# Patient Record
Sex: Female | Born: 1935 | Race: White | Hispanic: No | Marital: Married | Smoking: Former smoker
Health system: Southern US, Community
[De-identification: ages and names within clinical notes are randomized; demographics above are authoritative.]

## PROBLEM LIST (undated history)

## (undated) DIAGNOSIS — Q2730 Arteriovenous malformation, site unspecified: Secondary | ICD-10-CM

## (undated) DIAGNOSIS — K219 Gastro-esophageal reflux disease without esophagitis: Secondary | ICD-10-CM

## (undated) DIAGNOSIS — W19XXXA Unspecified fall, initial encounter: Secondary | ICD-10-CM

## (undated) DIAGNOSIS — Y92129 Unspecified place in nursing home as the place of occurrence of the external cause: Secondary | ICD-10-CM

## (undated) DIAGNOSIS — F419 Anxiety disorder, unspecified: Secondary | ICD-10-CM

## (undated) DIAGNOSIS — I2510 Atherosclerotic heart disease of native coronary artery without angina pectoris: Secondary | ICD-10-CM

## (undated) DIAGNOSIS — F0390 Unspecified dementia without behavioral disturbance: Secondary | ICD-10-CM

## (undated) DIAGNOSIS — K5790 Diverticulosis of intestine, part unspecified, without perforation or abscess without bleeding: Secondary | ICD-10-CM

## (undated) DIAGNOSIS — D649 Anemia, unspecified: Secondary | ICD-10-CM

## (undated) DIAGNOSIS — K922 Gastrointestinal hemorrhage, unspecified: Secondary | ICD-10-CM

## (undated) DIAGNOSIS — K222 Esophageal obstruction: Secondary | ICD-10-CM

## (undated) DIAGNOSIS — F32A Depression, unspecified: Secondary | ICD-10-CM

## (undated) DIAGNOSIS — K449 Diaphragmatic hernia without obstruction or gangrene: Secondary | ICD-10-CM

## (undated) DIAGNOSIS — L409 Psoriasis, unspecified: Secondary | ICD-10-CM

## (undated) DIAGNOSIS — R410 Disorientation, unspecified: Secondary | ICD-10-CM

## (undated) DIAGNOSIS — S72009A Fracture of unspecified part of neck of unspecified femur, initial encounter for closed fracture: Secondary | ICD-10-CM

## (undated) DIAGNOSIS — I10 Essential (primary) hypertension: Secondary | ICD-10-CM

## (undated) DIAGNOSIS — D509 Iron deficiency anemia, unspecified: Secondary | ICD-10-CM

## (undated) DIAGNOSIS — F329 Major depressive disorder, single episode, unspecified: Secondary | ICD-10-CM

## (undated) DIAGNOSIS — S329XXA Fracture of unspecified parts of lumbosacral spine and pelvis, initial encounter for closed fracture: Secondary | ICD-10-CM

## (undated) DIAGNOSIS — M1990 Unspecified osteoarthritis, unspecified site: Secondary | ICD-10-CM

## (undated) DIAGNOSIS — I999 Unspecified disorder of circulatory system: Secondary | ICD-10-CM

## (undated) DIAGNOSIS — IMO0002 Reserved for concepts with insufficient information to code with codable children: Secondary | ICD-10-CM

## (undated) DIAGNOSIS — K3184 Gastroparesis: Secondary | ICD-10-CM

## (undated) HISTORY — DX: Fracture of unspecified parts of lumbosacral spine and pelvis, initial encounter for closed fracture: S32.9XXA

## (undated) HISTORY — DX: Disorientation, unspecified: R41.0

## (undated) HISTORY — PX: TOTAL HIP ARTHROPLASTY: SHX124

## (undated) HISTORY — DX: Major depressive disorder, single episode, unspecified: F32.9

## (undated) HISTORY — DX: Unspecified place in nursing home as the place of occurrence of the external cause: Y92.129

## (undated) HISTORY — DX: Psoriasis, unspecified: L40.9

## (undated) HISTORY — DX: Gastrointestinal hemorrhage, unspecified: K92.2

## (undated) HISTORY — DX: Unspecified osteoarthritis, unspecified site: M19.90

## (undated) HISTORY — DX: Diverticulosis of intestine, part unspecified, without perforation or abscess without bleeding: K57.90

## (undated) HISTORY — DX: Unspecified fall, initial encounter: W19.XXXA

## (undated) HISTORY — DX: Depression, unspecified: F32.A

## (undated) HISTORY — DX: Esophageal obstruction: K22.2

## (undated) HISTORY — DX: Reserved for concepts with insufficient information to code with codable children: IMO0002

## (undated) HISTORY — PX: CHOLECYSTECTOMY: SHX55

## (undated) HISTORY — DX: Anxiety disorder, unspecified: F41.9

## (undated) HISTORY — DX: Iron deficiency anemia, unspecified: D50.9

## (undated) HISTORY — PX: HEMORRHOID SURGERY: SHX153

## (undated) HISTORY — PX: CATARACT EXTRACTION: SUR2

## (undated) HISTORY — DX: Diaphragmatic hernia without obstruction or gangrene: K44.9

## (undated) HISTORY — DX: Gastro-esophageal reflux disease without esophagitis: K21.9

## (undated) HISTORY — PX: INCISIONAL HERNIA REPAIR: SHX193

## (undated) HISTORY — DX: Unspecified disorder of circulatory system: I99.9

## (undated) HISTORY — DX: Gastroparesis: K31.84

## (undated) HISTORY — DX: Arteriovenous malformation, site unspecified: Q27.30

## (undated) HISTORY — DX: Unspecified dementia, unspecified severity, without behavioral disturbance, psychotic disturbance, mood disturbance, and anxiety: F03.90

---

## 1992-10-12 HISTORY — PX: OTHER SURGICAL HISTORY: SHX169

## 1995-07-04 DIAGNOSIS — K222 Esophageal obstruction: Secondary | ICD-10-CM

## 1995-07-04 DIAGNOSIS — K449 Diaphragmatic hernia without obstruction or gangrene: Secondary | ICD-10-CM

## 1995-07-04 HISTORY — DX: Esophageal obstruction: K22.2

## 1995-07-04 HISTORY — DX: Diaphragmatic hernia without obstruction or gangrene: K44.9

## 1998-04-20 ENCOUNTER — Encounter: Admission: RE | Admit: 1998-04-20 | Discharge: 1998-04-20 | Payer: Self-pay | Admitting: Family Medicine

## 1998-04-28 ENCOUNTER — Encounter: Admission: RE | Admit: 1998-04-28 | Discharge: 1998-04-28 | Payer: Self-pay | Admitting: Family Medicine

## 1998-05-20 ENCOUNTER — Encounter: Admission: RE | Admit: 1998-05-20 | Discharge: 1998-05-20 | Payer: Self-pay | Admitting: Family Medicine

## 1998-08-17 ENCOUNTER — Encounter: Admission: RE | Admit: 1998-08-17 | Discharge: 1998-08-17 | Payer: Self-pay | Admitting: Sports Medicine

## 1998-10-14 ENCOUNTER — Encounter: Admission: RE | Admit: 1998-10-14 | Discharge: 1998-10-14 | Payer: Self-pay | Admitting: Family Medicine

## 1998-12-14 ENCOUNTER — Encounter: Admission: RE | Admit: 1998-12-14 | Discharge: 1998-12-14 | Payer: Self-pay | Admitting: Family Medicine

## 1999-06-01 ENCOUNTER — Encounter: Admission: RE | Admit: 1999-06-01 | Discharge: 1999-06-01 | Payer: Self-pay | Admitting: Family Medicine

## 1999-06-24 ENCOUNTER — Encounter: Admission: RE | Admit: 1999-06-24 | Discharge: 1999-06-24 | Payer: Self-pay | Admitting: Sports Medicine

## 1999-07-07 ENCOUNTER — Encounter: Admission: RE | Admit: 1999-07-07 | Discharge: 1999-07-07 | Payer: Self-pay | Admitting: Sports Medicine

## 1999-07-07 ENCOUNTER — Encounter: Payer: Self-pay | Admitting: Sports Medicine

## 1999-12-08 ENCOUNTER — Encounter: Admission: RE | Admit: 1999-12-08 | Discharge: 1999-12-08 | Payer: Self-pay | Admitting: Family Medicine

## 2000-02-10 ENCOUNTER — Encounter: Admission: RE | Admit: 2000-02-10 | Discharge: 2000-02-10 | Payer: Self-pay | Admitting: Family Medicine

## 2000-08-14 ENCOUNTER — Encounter: Admission: RE | Admit: 2000-08-14 | Discharge: 2000-08-14 | Payer: Self-pay | Admitting: Family Medicine

## 2000-09-14 ENCOUNTER — Encounter: Admission: RE | Admit: 2000-09-14 | Discharge: 2000-09-14 | Payer: Self-pay | Admitting: Family Medicine

## 2000-09-18 ENCOUNTER — Encounter: Admission: RE | Admit: 2000-09-18 | Discharge: 2000-09-18 | Payer: Self-pay | Admitting: Family Medicine

## 2000-10-02 ENCOUNTER — Encounter (INDEPENDENT_AMBULATORY_CARE_PROVIDER_SITE_OTHER): Payer: Self-pay | Admitting: *Deleted

## 2000-10-02 LAB — CONVERTED CEMR LAB

## 2000-10-23 ENCOUNTER — Encounter: Admission: RE | Admit: 2000-10-23 | Discharge: 2000-10-23 | Payer: Self-pay | Admitting: Family Medicine

## 2000-10-23 ENCOUNTER — Encounter: Payer: Self-pay | Admitting: Family Medicine

## 2001-03-12 ENCOUNTER — Encounter: Admission: RE | Admit: 2001-03-12 | Discharge: 2001-03-12 | Payer: Self-pay | Admitting: Sports Medicine

## 2001-04-02 ENCOUNTER — Encounter: Admission: RE | Admit: 2001-04-02 | Discharge: 2001-04-02 | Payer: Self-pay | Admitting: Family Medicine

## 2001-09-05 ENCOUNTER — Encounter: Admission: RE | Admit: 2001-09-05 | Discharge: 2001-09-05 | Payer: Self-pay | Admitting: Family Medicine

## 2001-09-25 ENCOUNTER — Encounter: Admission: RE | Admit: 2001-09-25 | Discharge: 2001-09-25 | Payer: Self-pay | Admitting: Family Medicine

## 2001-10-01 ENCOUNTER — Encounter: Admission: RE | Admit: 2001-10-01 | Discharge: 2001-10-01 | Payer: Self-pay | Admitting: Family Medicine

## 2001-10-15 ENCOUNTER — Encounter: Payer: Self-pay | Admitting: Family Medicine

## 2001-10-15 ENCOUNTER — Encounter: Admission: RE | Admit: 2001-10-15 | Discharge: 2001-10-15 | Payer: Self-pay | Admitting: Family Medicine

## 2001-10-31 ENCOUNTER — Encounter: Admission: RE | Admit: 2001-10-31 | Discharge: 2001-10-31 | Payer: Self-pay | Admitting: Family Medicine

## 2001-11-07 ENCOUNTER — Encounter: Admission: RE | Admit: 2001-11-07 | Discharge: 2001-11-07 | Payer: Self-pay | Admitting: Family Medicine

## 2002-02-06 ENCOUNTER — Encounter: Admission: RE | Admit: 2002-02-06 | Discharge: 2002-02-06 | Payer: Self-pay | Admitting: Family Medicine

## 2002-04-24 ENCOUNTER — Inpatient Hospital Stay (HOSPITAL_COMMUNITY): Admission: EM | Admit: 2002-04-24 | Discharge: 2002-04-25 | Payer: Self-pay | Admitting: Emergency Medicine

## 2002-04-24 ENCOUNTER — Encounter: Payer: Self-pay | Admitting: Emergency Medicine

## 2002-05-01 ENCOUNTER — Encounter: Admission: RE | Admit: 2002-05-01 | Discharge: 2002-05-01 | Payer: Self-pay | Admitting: Family Medicine

## 2002-10-23 ENCOUNTER — Encounter: Admission: RE | Admit: 2002-10-23 | Discharge: 2002-10-23 | Payer: Self-pay | Admitting: Family Medicine

## 2003-03-31 ENCOUNTER — Encounter: Admission: RE | Admit: 2003-03-31 | Discharge: 2003-03-31 | Payer: Self-pay | Admitting: Sports Medicine

## 2003-04-01 ENCOUNTER — Encounter: Admission: RE | Admit: 2003-04-01 | Discharge: 2003-04-01 | Payer: Self-pay | Admitting: Family Medicine

## 2003-04-29 ENCOUNTER — Encounter: Admission: RE | Admit: 2003-04-29 | Discharge: 2003-04-29 | Payer: Self-pay | Admitting: Family Medicine

## 2003-06-22 ENCOUNTER — Ambulatory Visit (HOSPITAL_BASED_OUTPATIENT_CLINIC_OR_DEPARTMENT_OTHER): Admission: RE | Admit: 2003-06-22 | Discharge: 2003-06-22 | Payer: Self-pay | Admitting: Surgery

## 2003-06-22 ENCOUNTER — Ambulatory Visit (HOSPITAL_COMMUNITY): Admission: RE | Admit: 2003-06-22 | Discharge: 2003-06-22 | Payer: Self-pay | Admitting: Surgery

## 2003-06-30 ENCOUNTER — Encounter: Admission: RE | Admit: 2003-06-30 | Discharge: 2003-06-30 | Payer: Self-pay | Admitting: Family Medicine

## 2003-11-17 ENCOUNTER — Encounter: Admission: RE | Admit: 2003-11-17 | Discharge: 2003-11-17 | Payer: Self-pay | Admitting: Family Medicine

## 2003-12-01 ENCOUNTER — Encounter: Admission: RE | Admit: 2003-12-01 | Discharge: 2003-12-01 | Payer: Self-pay | Admitting: Family Medicine

## 2003-12-11 ENCOUNTER — Encounter: Admission: RE | Admit: 2003-12-11 | Discharge: 2003-12-11 | Payer: Self-pay | Admitting: Emergency Medicine

## 2004-04-05 ENCOUNTER — Ambulatory Visit: Payer: Self-pay | Admitting: Family Medicine

## 2004-04-08 ENCOUNTER — Ambulatory Visit (HOSPITAL_COMMUNITY): Admission: RE | Admit: 2004-04-08 | Discharge: 2004-04-08 | Payer: Self-pay | Admitting: Family Medicine

## 2004-04-08 ENCOUNTER — Ambulatory Visit: Payer: Self-pay | Admitting: Family Medicine

## 2004-04-26 ENCOUNTER — Ambulatory Visit: Payer: Self-pay | Admitting: Family Medicine

## 2004-05-11 ENCOUNTER — Ambulatory Visit: Payer: Self-pay | Admitting: Gastroenterology

## 2004-07-19 ENCOUNTER — Ambulatory Visit: Payer: Self-pay | Admitting: Family Medicine

## 2004-08-09 ENCOUNTER — Ambulatory Visit: Payer: Self-pay | Admitting: Family Medicine

## 2004-09-07 ENCOUNTER — Ambulatory Visit: Payer: Self-pay | Admitting: Gastroenterology

## 2004-09-14 ENCOUNTER — Ambulatory Visit (HOSPITAL_COMMUNITY): Admission: RE | Admit: 2004-09-14 | Discharge: 2004-09-14 | Payer: Self-pay | Admitting: Gastroenterology

## 2004-09-29 ENCOUNTER — Ambulatory Visit (HOSPITAL_COMMUNITY): Admission: RE | Admit: 2004-09-29 | Discharge: 2004-09-29 | Payer: Self-pay | Admitting: Gastroenterology

## 2004-09-29 ENCOUNTER — Ambulatory Visit: Payer: Self-pay | Admitting: Gastroenterology

## 2004-12-20 ENCOUNTER — Ambulatory Visit: Payer: Self-pay | Admitting: Family Medicine

## 2005-04-19 ENCOUNTER — Emergency Department (HOSPITAL_COMMUNITY): Admission: EM | Admit: 2005-04-19 | Discharge: 2005-04-19 | Payer: Self-pay | Admitting: Emergency Medicine

## 2005-04-25 ENCOUNTER — Ambulatory Visit: Payer: Self-pay | Admitting: Family Medicine

## 2005-07-27 ENCOUNTER — Ambulatory Visit: Payer: Self-pay | Admitting: Family Medicine

## 2005-10-17 ENCOUNTER — Emergency Department (HOSPITAL_COMMUNITY): Admission: EM | Admit: 2005-10-17 | Discharge: 2005-10-17 | Payer: Self-pay | Admitting: Family Medicine

## 2005-10-24 ENCOUNTER — Ambulatory Visit: Payer: Self-pay | Admitting: Family Medicine

## 2005-11-07 ENCOUNTER — Ambulatory Visit: Payer: Self-pay | Admitting: Family Medicine

## 2006-03-19 ENCOUNTER — Emergency Department (HOSPITAL_COMMUNITY): Admission: EM | Admit: 2006-03-19 | Discharge: 2006-03-19 | Payer: Self-pay | Admitting: Emergency Medicine

## 2006-03-29 ENCOUNTER — Ambulatory Visit: Payer: Self-pay | Admitting: Family Medicine

## 2006-05-14 ENCOUNTER — Ambulatory Visit: Payer: Self-pay | Admitting: Sports Medicine

## 2006-08-30 DIAGNOSIS — H3530 Unspecified macular degeneration: Secondary | ICD-10-CM | POA: Insufficient documentation

## 2006-08-30 DIAGNOSIS — E119 Type 2 diabetes mellitus without complications: Secondary | ICD-10-CM

## 2006-08-30 DIAGNOSIS — I10 Essential (primary) hypertension: Secondary | ICD-10-CM

## 2006-08-30 DIAGNOSIS — E785 Hyperlipidemia, unspecified: Secondary | ICD-10-CM

## 2006-08-31 ENCOUNTER — Encounter (INDEPENDENT_AMBULATORY_CARE_PROVIDER_SITE_OTHER): Payer: Self-pay | Admitting: *Deleted

## 2006-09-18 ENCOUNTER — Ambulatory Visit: Payer: Self-pay | Admitting: Family Medicine

## 2006-09-18 LAB — CONVERTED CEMR LAB
AST: 25 units/L (ref 0–37)
Albumin: 3.8 g/dL (ref 3.5–5.2)
Alkaline Phosphatase: 77 units/L (ref 39–117)
HDL: 38 mg/dL — ABNORMAL LOW (ref >39)
LDL Cholesterol: 127 mg/dL — ABNORMAL HIGH (ref 0–99)
Potassium: 3.6 meq/L (ref 3.5–5.3)
Sodium: 140 meq/L (ref 135–145)
Total Bilirubin: 0.3 mg/dL (ref 0.3–1.2)
Total Protein: 7.1 g/dL (ref 6.0–8.3)
VLDL: 54 mg/dL — ABNORMAL HIGH (ref 0–40)

## 2006-10-09 ENCOUNTER — Ambulatory Visit: Payer: Self-pay | Admitting: Family Medicine

## 2006-10-09 DIAGNOSIS — N3946 Mixed incontinence: Secondary | ICD-10-CM

## 2006-11-19 ENCOUNTER — Emergency Department (HOSPITAL_COMMUNITY): Admission: EM | Admit: 2006-11-19 | Discharge: 2006-11-19 | Payer: Self-pay | Admitting: Emergency Medicine

## 2006-11-22 ENCOUNTER — Ambulatory Visit: Payer: Self-pay | Admitting: Family Medicine

## 2006-11-22 DIAGNOSIS — L408 Other psoriasis: Secondary | ICD-10-CM

## 2006-11-22 LAB — CONVERTED CEMR LAB
MCV: 71.5 fL
RBC: 5.23 M/uL
WBC: 8.9 10*3/uL

## 2006-11-23 ENCOUNTER — Encounter: Payer: Self-pay | Admitting: Family Medicine

## 2006-11-23 LAB — CONVERTED CEMR LAB
BUN: 12 mg/dL (ref 6–23)
CO2: 28 meq/L (ref 19–32)
Glucose, Bld: 181 mg/dL — ABNORMAL HIGH (ref 70–99)
Potassium: 4 meq/L (ref 3.5–5.3)
Pro B Natriuretic peptide (BNP): 78 pg/mL (ref 0.0–100.0)

## 2006-11-27 ENCOUNTER — Encounter: Payer: Self-pay | Admitting: Family Medicine

## 2006-12-03 ENCOUNTER — Encounter: Admission: RE | Admit: 2006-12-03 | Discharge: 2006-12-03 | Payer: Self-pay | Admitting: Family Medicine

## 2006-12-04 ENCOUNTER — Ambulatory Visit: Payer: Self-pay | Admitting: Family Medicine

## 2007-01-31 ENCOUNTER — Encounter: Payer: Self-pay | Admitting: Family Medicine

## 2007-02-28 ENCOUNTER — Encounter: Payer: Self-pay | Admitting: Family Medicine

## 2007-03-21 ENCOUNTER — Encounter: Payer: Self-pay | Admitting: Family Medicine

## 2007-05-03 ENCOUNTER — Encounter: Payer: Self-pay | Admitting: Emergency Medicine

## 2007-05-03 ENCOUNTER — Encounter: Payer: Self-pay | Admitting: Family Medicine

## 2007-05-04 ENCOUNTER — Ambulatory Visit: Payer: Self-pay | Admitting: Cardiology

## 2007-05-04 ENCOUNTER — Ambulatory Visit: Payer: Self-pay | Admitting: Internal Medicine

## 2007-05-04 ENCOUNTER — Inpatient Hospital Stay (HOSPITAL_COMMUNITY): Admission: EM | Admit: 2007-05-04 | Discharge: 2007-05-09 | Payer: Self-pay | Admitting: Family Medicine

## 2007-05-06 ENCOUNTER — Encounter: Payer: Self-pay | Admitting: Family Medicine

## 2007-05-13 ENCOUNTER — Encounter: Admission: RE | Admit: 2007-05-13 | Discharge: 2007-05-13 | Payer: Self-pay | Admitting: Family Medicine

## 2007-05-13 ENCOUNTER — Ambulatory Visit: Payer: Self-pay | Admitting: Family Medicine

## 2007-05-13 DIAGNOSIS — R262 Difficulty in walking, not elsewhere classified: Secondary | ICD-10-CM | POA: Insufficient documentation

## 2007-05-13 LAB — CONVERTED CEMR LAB
BUN: 10 mg/dL (ref 6–23)
CO2: 27 meq/L (ref 19–32)
Calcium: 9 mg/dL (ref 8.4–10.5)
Chloride: 104 meq/L (ref 96–112)
Creatinine, Ser: 0.61 mg/dL (ref 0.40–1.20)

## 2007-05-23 ENCOUNTER — Encounter: Payer: Self-pay | Admitting: Family Medicine

## 2007-10-22 ENCOUNTER — Telehealth: Payer: Self-pay | Admitting: *Deleted

## 2007-10-22 ENCOUNTER — Ambulatory Visit: Payer: Self-pay | Admitting: Family Medicine

## 2007-10-22 LAB — CONVERTED CEMR LAB
ALT: 9 units/L (ref 0–35)
AST: 11 units/L (ref 0–37)
Albumin: 3.5 g/dL (ref 3.5–5.2)
Alkaline Phosphatase: 90 units/L (ref 39–117)
Basophils Absolute: 0 10*3/uL (ref 0.0–0.1)
Basophils Relative: 0 % (ref 0–1)
Eosinophils Absolute: 0 10*3/uL (ref 0.0–0.7)
Eosinophils Relative: 0 % (ref 0–5)
Glucose, Bld: 238 mg/dL — ABNORMAL HIGH (ref 70–99)
HCT: 42.3 % (ref 36.0–46.0)
Lymphs Abs: 1.4 10*3/uL (ref 0.7–4.0)
MCHC: 29.8 g/dL — ABNORMAL LOW (ref 30.0–36.0)
MCV: 74.6 fL — ABNORMAL LOW (ref 78.0–100.0)
Neutrophils Relative %: 74 % (ref 43–77)
Platelets: 323 10*3/uL (ref 150–400)
Potassium: 3.8 meq/L (ref 3.5–5.3)
RDW: 19.8 % — ABNORMAL HIGH (ref 11.5–15.5)
Sodium: 141 meq/L (ref 135–145)
Total Bilirubin: 0.3 mg/dL (ref 0.3–1.2)
Total Protein: 6.7 g/dL (ref 6.0–8.3)
WBC: 7.7 10*3/uL (ref 4.0–10.5)

## 2007-10-28 ENCOUNTER — Ambulatory Visit: Payer: Self-pay | Admitting: Family Medicine

## 2007-10-28 LAB — CONVERTED CEMR LAB
LDL Cholesterol: 133 mg/dL — ABNORMAL HIGH (ref 0–99)
Triglycerides: 179 mg/dL — ABNORMAL HIGH (ref <150)
VLDL: 36 mg/dL (ref 0–40)

## 2007-11-07 LAB — CONVERTED CEMR LAB

## 2007-11-26 ENCOUNTER — Telehealth: Payer: Self-pay | Admitting: *Deleted

## 2007-11-28 ENCOUNTER — Ambulatory Visit: Payer: Self-pay | Admitting: Family Medicine

## 2008-01-23 ENCOUNTER — Ambulatory Visit: Payer: Self-pay | Admitting: Sports Medicine

## 2008-01-23 ENCOUNTER — Telehealth: Payer: Self-pay | Admitting: *Deleted

## 2008-02-06 ENCOUNTER — Encounter: Payer: Self-pay | Admitting: *Deleted

## 2008-03-26 ENCOUNTER — Telehealth: Payer: Self-pay | Admitting: *Deleted

## 2008-03-27 ENCOUNTER — Ambulatory Visit: Payer: Self-pay | Admitting: Family Medicine

## 2008-03-27 LAB — CONVERTED CEMR LAB: Hgb A1c MFr Bld: 8.6 %

## 2008-04-02 ENCOUNTER — Telehealth: Payer: Self-pay | Admitting: *Deleted

## 2008-04-09 ENCOUNTER — Ambulatory Visit: Payer: Self-pay | Admitting: Family Medicine

## 2008-12-05 ENCOUNTER — Ambulatory Visit: Payer: Self-pay | Admitting: Family Medicine

## 2008-12-05 ENCOUNTER — Encounter: Payer: Self-pay | Admitting: Emergency Medicine

## 2008-12-05 ENCOUNTER — Inpatient Hospital Stay (HOSPITAL_COMMUNITY): Admission: EM | Admit: 2008-12-05 | Discharge: 2008-12-07 | Payer: Self-pay | Admitting: Family Medicine

## 2008-12-06 ENCOUNTER — Encounter: Payer: Self-pay | Admitting: Family Medicine

## 2008-12-06 DIAGNOSIS — F29 Unspecified psychosis not due to a substance or known physiological condition: Secondary | ICD-10-CM | POA: Insufficient documentation

## 2008-12-07 ENCOUNTER — Ambulatory Visit: Payer: Self-pay | Admitting: Vascular Surgery

## 2008-12-07 ENCOUNTER — Encounter: Payer: Self-pay | Admitting: Family Medicine

## 2008-12-08 ENCOUNTER — Telehealth: Payer: Self-pay | Admitting: *Deleted

## 2008-12-08 ENCOUNTER — Telehealth: Payer: Self-pay | Admitting: Family Medicine

## 2008-12-08 LAB — CONVERTED CEMR LAB: Hgb A1c MFr Bld: 7.1 %

## 2008-12-10 DIAGNOSIS — R410 Disorientation, unspecified: Secondary | ICD-10-CM

## 2008-12-10 HISTORY — DX: Disorientation, unspecified: R41.0

## 2008-12-14 ENCOUNTER — Ambulatory Visit: Payer: Self-pay | Admitting: Family Medicine

## 2008-12-14 LAB — CONVERTED CEMR LAB
Ketones, urine, test strip: NEGATIVE
Nitrite: NEGATIVE
Specific Gravity, Urine: 1.025
Urobilinogen, UA: 0.2
pH: 6.5

## 2008-12-29 ENCOUNTER — Telehealth: Payer: Self-pay | Admitting: *Deleted

## 2009-01-07 ENCOUNTER — Emergency Department (HOSPITAL_COMMUNITY): Admission: EM | Admit: 2009-01-07 | Discharge: 2009-01-07 | Payer: Self-pay | Admitting: Emergency Medicine

## 2009-01-08 ENCOUNTER — Telehealth: Payer: Self-pay | Admitting: Family Medicine

## 2009-01-11 ENCOUNTER — Ambulatory Visit: Payer: Self-pay | Admitting: Family Medicine

## 2009-06-02 ENCOUNTER — Emergency Department (HOSPITAL_COMMUNITY): Admission: EM | Admit: 2009-06-02 | Discharge: 2009-06-02 | Payer: Self-pay | Admitting: Emergency Medicine

## 2009-06-02 ENCOUNTER — Telehealth: Payer: Self-pay | Admitting: Family Medicine

## 2009-06-04 ENCOUNTER — Emergency Department (HOSPITAL_COMMUNITY): Admission: EM | Admit: 2009-06-04 | Discharge: 2009-06-05 | Payer: Self-pay | Admitting: Emergency Medicine

## 2009-06-04 ENCOUNTER — Telehealth: Payer: Self-pay | Admitting: Family Medicine

## 2009-06-08 ENCOUNTER — Telehealth: Payer: Self-pay | Admitting: Family Medicine

## 2009-06-11 ENCOUNTER — Ambulatory Visit: Payer: Self-pay | Admitting: Family Medicine

## 2009-07-01 ENCOUNTER — Inpatient Hospital Stay (HOSPITAL_COMMUNITY): Admission: EM | Admit: 2009-07-01 | Discharge: 2009-07-15 | Payer: Self-pay | Admitting: Emergency Medicine

## 2009-07-01 ENCOUNTER — Encounter: Payer: Self-pay | Admitting: Family Medicine

## 2009-07-01 ENCOUNTER — Ambulatory Visit: Payer: Self-pay | Admitting: Family Medicine

## 2009-07-01 DIAGNOSIS — S329XXA Fracture of unspecified parts of lumbosacral spine and pelvis, initial encounter for closed fracture: Secondary | ICD-10-CM

## 2009-07-01 DIAGNOSIS — E041 Nontoxic single thyroid nodule: Secondary | ICD-10-CM | POA: Insufficient documentation

## 2009-07-01 HISTORY — DX: Fracture of unspecified parts of lumbosacral spine and pelvis, initial encounter for closed fracture: S32.9XXA

## 2009-07-04 ENCOUNTER — Encounter: Payer: Self-pay | Admitting: Family Medicine

## 2009-07-09 ENCOUNTER — Telehealth: Payer: Self-pay | Admitting: Family Medicine

## 2009-08-19 ENCOUNTER — Telehealth: Payer: Self-pay | Admitting: *Deleted

## 2009-09-09 ENCOUNTER — Ambulatory Visit: Payer: Self-pay | Admitting: Family Medicine

## 2009-10-29 ENCOUNTER — Telehealth: Payer: Self-pay | Admitting: Family Medicine

## 2010-07-24 ENCOUNTER — Encounter: Payer: Self-pay | Admitting: Gastroenterology

## 2010-08-02 NOTE — Progress Notes (Signed)
Summary: phn msg  Phone Note From Other Clinic Call back at [phone]   Caller: [name] Summary of Call: has been released from hospital and is wanting to know if she needs to follow up w/ orthopedics for hip fracture Initial call taken by: De Nurse,  August 19, 2009 2:22 PM  Follow-up for Phone Call        would need to call her orthopedist to ask that. She should see me in the office in the next 2-3 weeks thanks  Follow-up by: Pearlean Brownie MD,  August 19, 2009 3:08 PM  Additional Follow-up for Phone Call Additional follow up Details #1::        [name] informed appt scheduled with PCP for 09/09/09.  Additional Follow-up by: Garen Grams LPN,  August 19, 2009 4:45 PM

## 2010-08-02 NOTE — Progress Notes (Signed)
Summary: phn msg  Phone Note Call from Patient Call back at [phone]   Caller: sister-Mary Truglio Summary of Call: calling to talk to Dr. Deirdre Priest concerning her sister's condition. Initial call taken by: Clydell Hakim,  July 09, 2009 11:58 AM  Follow-up for Phone Call        Spoke with her sister She wiill be visiting in a few weeks Follow-up by: Pearlean Brownie MD,  July 09, 2009 1:42 PM

## 2010-08-02 NOTE — Assessment & Plan Note (Signed)
Summary: Mackenzie [name]   Vital Signs:  Patient profile:   77 year old female Height:      65 inches Temp:     98.4 degrees F oral Pulse rate:   81 / minute BP sitting:   118 / 67  (left arm) Cuff size:   regular  Vitals Entered By: Garen Grams LPN (September 09, 2009 10:31 AM) CC: Mackenzie [name] Is Patient Diabetic? Yes Did you bring your meter with you today? No Pain Assessment Patient in pain? no        Primary Care Provider:  Pearlean Brownie MD  CC:  Mackenzie [name].  History of Present Illness: Here from [facility] Feels well  Current Problems:   PELVIC FRACTURE (ICD-808.8) saw orthopedist last week her released her to [facility].  Has not stood on her own.  Will be getting PT at nursing home.  Minimal pain with sitting  MENTAL CONFUSION (ICD-298.9) stable according to her husband Leonette Most.  No recent loss of consciosness or focal weakenss  HYPERTENSION, BENIGN SYSTEMIC (ICD-401.1) brought in her medication list.  No chest pain or shortness of breath   HYPERLIPIDEMIA (ICD-272.4) taking zocor daily.  No right upper quadrant pain or chest pain  DIABETES MELLITUS II, UNCOMPLICATED (ICD-250.00) taking metformin.  Husband does not think getting insulin at nursing home.  No hypoglycemic spells that either knows of  ROS - as above PMH - Medications reviewed and updated in medication list.  Smoking Status noted in VS form     Habits & Providers  Alcohol-Tobacco-Diet     Tobacco Status: never   Current Medications (verified): 1)  Bayer Childrens Aspirin 81 Mg Chew (Aspirin) .... Take 1 Tablet By Mouth Once A Day 2)  Microzide 12.5 Mg Caps (Hydrochlorothiazide) .Marland Kitchen.. 1 Daily 3)  Triamcinolone Acetonide 0.5 % Oint (Triamcinolone Acetonide) .... Apply To Psoriasis Two Times A Day. Largest Tube Available - 60 Gram 4)  Metformin Hcl 1000 Mg Tabs (Metformin Hcl) .... Take 1 Tablet By Mouth Two Times A Day For Diabetes 5)  Simvastatin 40 Mg Tabs (Simvastatin) .... Take One Tablet At Bedtime 6)   Calcium Carbonate-Vitamin D 600-400 Mg-Unit  Tabs (Calcium Carbonate-Vitamin D) .Marland Kitchen.. 1 By Mouth Two Times A Day 7)  Lopressor 50 Mg Tabs (Metoprolol Tartrate) .Marland Kitchen.. 1 By Mouth Two Times A Day 8)  Remeron 15 Mg Tabs (Mirtazapine) .... 1/2 Tab At Bedtime 9)  Miacalcin 200 Unit/ml Soln (Calcitonin (Salmon)) .Marland Kitchen.. 1 Spray Daily  Allergies: No Known Drug Allergies  Physical Exam  General:  awake alert converstational Lungs:  Normal respiratory effort, chest expands symmetrically. Lungs are clear to auscultation, no crackles or wheezes. Heart:  Normal rate and regular rhythm. S1 and S2 normal without gallop, murmur, click, rub or other extra sounds. Msk:  Sitting in wheel chair.  Not able to rise on own Minimal pain with ROM of either hip. Distal strength in ankles intact.  Hip extenders are weak bilaterally  Skin:  no skin breakdown on feet or legs    Impression & Recommendations:  Problem # 1:  PELVIC FRACTURE (ICD-808.8) Assessment Improved  Now major need is for PT  Orders: [facility]- Est  Level 4 (16109)  Problem # 2:  DIABETES MELLITUS II, UNCOMPLICATED (ICD-250.00) Assessment: Improved Good control  The following medications were removed from the medication list:    Enalapril Maleate 10 Mg Tabs (Enalapril maleate) .Marland Kitchen... 1 daily for blood pressure Her updated medication list for this problem includes:    Bayer Childrens Aspirin 81  Mg Chew (Aspirin) .Marland Kitchen... Take 1 tablet by mouth once a day    Metformin Hcl 1000 Mg Tabs (Metformin hcl) .Marland Kitchen... Take 1 tablet by mouth two times a day for diabetes  Orders: A1C-[facility] (45409) [facility]- Est  Level 4 (81191)  Labs Reviewed: Creat: 0.46 (10/22/2007)    Reviewed HgBA1c results: 6.6 (09/09/2009)  7.1 (12/08/2008)  Problem # 3:  HYPERTENSION, BENIGN SYSTEMIC (ICD-401.1)  Good control today The following medications were removed from the medication list:    Enalapril Maleate 10 Mg Tabs (Enalapril maleate) .Marland Kitchen... 1 daily for blood pressure Her  updated medication list for this problem includes:    Microzide 12.5 Mg Caps (Hydrochlorothiazide) .Marland Kitchen... 1 daily    Lopressor 50 Mg Tabs (Metoprolol tartrate) .Marland Kitchen... 1 by mouth two times a day  BP today: 118/67 Prior BP: 226/89 (07/01/2009)  Labs Reviewed: K+: 3.8 (10/22/2007) Creat: : 0.46 (10/22/2007)   Chol: 206 (10/28/2007)   HDL: 37 (10/28/2007)   LDL: 133 (10/28/2007)   TG: 179 (10/28/2007)  Orders: [facility]- Est  Level 4 (99214)  Problem # 4:  THYROID NODULE (ICD-241.0) Will need to follow up on next visit  Problem # 5:  MENTAL CONFUSION (ICD-298.9) consistent with alzhiemers.  If she is able to get home again will discuss starting therapy   Orders: [facility]- Est  Level 4 (47829)  Problem # 6:  WALKING DIFFICULTY (ICD-719.7) Hopefully PT will improve her strength and balance  Complete Medication List: 1)  Bayer Childrens Aspirin 81 Mg Chew (Aspirin) .... Take 1 tablet by mouth once a day 2)  Microzide 12.5 Mg Caps (Hydrochlorothiazide) .Marland Kitchen.. 1 daily 3)  Triamcinolone Acetonide 0.5 % Oint (Triamcinolone acetonide) .... Apply to psoriasis two times a day. largest tube available - 60 gram 4)  Metformin Hcl 1000 Mg Tabs (Metformin hcl) .... Take 1 tablet by mouth two times a day for diabetes 5)  Simvastatin 40 Mg Tabs (Simvastatin) .... Take one tablet at bedtime 6)  Calcium Carbonate-vitamin D 600-400 Mg-unit Tabs (Calcium carbonate-vitamin d) .Marland Kitchen.. 1 by mouth two times a day 7)  Lopressor 50 Mg Tabs (Metoprolol tartrate) .Marland Kitchen.. 1 by mouth two times a day 8)  Remeron 15 Mg Tabs (Mirtazapine) .... 1/2 tab at bedtime 9)  Miacalcin 200 Unit/ml Soln (Calcitonin (salmon)) .Marland Kitchen.. 1 spray daily  Patient Instructions: 1)  Please schedule a follow-up appointment in 6 months .  2)  Your diabetes is doing very well as your blood pressure 3)  Your main task is to exercise.   Work very hard with PT 4)  and do leg lifts when sitting at least three times a day 5)  If you are discharged from the nursing  home come see me in the next week or so  Laboratory Results   Blood Tests   Date/Time Received: September 09, 2009 10:25 AM  Date/Time Reported: September 09, 2009 10:50 AM   HGBA1C: 6.6%   (Normal Range: Non-Diabetic - 3-6%   Control Diabetic - 6-8%)  Comments: ...........test performed by..........Marland KitchenGaren Grams, LPN entered by Terese Door, CMA

## 2010-08-02 NOTE — Progress Notes (Signed)
Summary: phn msg  Phone Note Call from Patient Call back at [phone]   Caller: Mackenzie [name]- Sister Summary of Call: wants to know if she can come home from nursing home - at one time they told her that she has alztheimers and she states she(pt) doesn't.  She wants to go home. Initial call taken by: De Nurse,  October 29, 2009 12:10 PM

## 2010-09-15 ENCOUNTER — Encounter: Payer: Self-pay | Admitting: Family Medicine

## 2010-09-15 ENCOUNTER — Ambulatory Visit (INDEPENDENT_AMBULATORY_CARE_PROVIDER_SITE_OTHER): Payer: Medicare Other | Admitting: Family Medicine

## 2010-09-15 VITALS — BP 122/60 | HR 88 | Temp 98.4°F | Ht 65.0 in

## 2010-09-15 DIAGNOSIS — E785 Hyperlipidemia, unspecified: Secondary | ICD-10-CM

## 2010-09-15 DIAGNOSIS — E119 Type 2 diabetes mellitus without complications: Secondary | ICD-10-CM

## 2010-09-15 DIAGNOSIS — F329 Major depressive disorder, single episode, unspecified: Secondary | ICD-10-CM

## 2010-09-15 DIAGNOSIS — I10 Essential (primary) hypertension: Secondary | ICD-10-CM

## 2010-09-15 DIAGNOSIS — R262 Difficulty in walking, not elsewhere classified: Secondary | ICD-10-CM

## 2010-09-15 DIAGNOSIS — R5383 Other fatigue: Secondary | ICD-10-CM

## 2010-09-15 DIAGNOSIS — R531 Weakness: Secondary | ICD-10-CM | POA: Insufficient documentation

## 2010-09-15 DIAGNOSIS — M810 Age-related osteoporosis without current pathological fracture: Secondary | ICD-10-CM | POA: Insufficient documentation

## 2010-09-15 DIAGNOSIS — N3946 Mixed incontinence: Secondary | ICD-10-CM

## 2010-09-15 DIAGNOSIS — F0390 Unspecified dementia without behavioral disturbance: Secondary | ICD-10-CM

## 2010-09-15 LAB — CONVERTED CEMR LAB
ALT: 52 units/L — ABNORMAL HIGH (ref 0–35)
AST: 31 units/L (ref 0–37)
Albumin: 3.9 g/dL (ref 3.5–5.2)
Calcium: 9.9 mg/dL (ref 8.4–10.5)
Chloride: 101 meq/L (ref 96–112)
Creatinine, Ser: 0.74 mg/dL (ref 0.40–1.20)
Platelets: 270 10*3/uL (ref 150–400)
Potassium: 4.2 meq/L (ref 3.5–5.3)
RDW: 16.9 % — ABNORMAL HIGH (ref 11.5–15.5)

## 2010-09-15 LAB — CBC
HCT: 42.3 % (ref 36.0–46.0)
Hemoglobin: 12.8 g/dL (ref 12.0–15.0)
MCHC: 30.3 g/dL (ref 30.0–36.0)
MCV: 83.3 fL (ref 78.0–100.0)

## 2010-09-15 LAB — COMPREHENSIVE METABOLIC PANEL
ALT: 52 U/L — ABNORMAL HIGH (ref 0–35)
AST: 31 U/L (ref 0–37)
Alkaline Phosphatase: 186 U/L — ABNORMAL HIGH (ref 39–117)
CO2: 29 mEq/L (ref 19–32)
Creat: 0.74 mg/dL (ref 0.40–1.20)
Sodium: 142 mEq/L (ref 135–145)
Total Bilirubin: 0.2 mg/dL — ABNORMAL LOW (ref 0.3–1.2)
Total Protein: 6.8 g/dL (ref 6.0–8.3)

## 2010-09-15 LAB — TSH: TSH: 0.68 u[IU]/mL (ref 0.350–4.500)

## 2010-09-15 LAB — POCT GLYCOSYLATED HEMOGLOBIN (HGB A1C): Hemoglobin A1C: 6.6

## 2010-09-15 MED ORDER — SERTRALINE HCL 50 MG PO TABS: 50.0000 mg | ORAL_TABLET | Freq: Every day | ORAL | Status: DC

## 2010-09-15 MED ORDER — OYSTER SHELL CALCIUM 500 MG PO TABS: 500.0000 mg | ORAL_TABLET | Freq: Three times a day (TID) | ORAL | Status: DC

## 2010-09-15 MED ORDER — SIMVASTATIN 40 MG PO TABS: 40.0000 mg | ORAL_TABLET | Freq: Every day | ORAL | Status: DC

## 2010-09-15 MED ORDER — VITAMIN D 1000 UNITS PO CAPS: 2000.0000 [IU] | ORAL_CAPSULE | Freq: Every day | ORAL | Status: DC

## 2010-09-15 MED ORDER — METOPROLOL TARTRATE 50 MG PO TABS: 50.0000 mg | ORAL_TABLET | Freq: Two times a day (BID) | ORAL | Status: DC

## 2010-09-15 MED ORDER — ACETAMINOPHEN ER 650 MG PO TBCR: 650.0000 mg | EXTENDED_RELEASE_TABLET | Freq: Three times a day (TID) | ORAL | Status: AC | PRN

## 2010-09-15 MED ORDER — DONEPEZIL HCL 5 MG PO TABS: 5.0000 mg | ORAL_TABLET | Freq: Every day | ORAL | Status: DC

## 2010-09-15 NOTE — Assessment & Plan Note (Signed)
Needs refill on Zoloft. Did well on geri depression scale, not depressed/effective tx.

## 2010-09-15 NOTE — Progress Notes (Signed)
  Subjective:    Patient ID: Mackenzie [name], female    DOB: Jan 15, 1928, 77 y.o.   MRN: [id number]  HPI Pt is here for geri assessment and post SNF f/u.  Geri function screen: Unable to recall all, trouble with stairs, unable to do own shopping, has sx of urge incontinence, unsteady gait on ambulation. Geri depression screen: 2 positive answers. MMSE: 22/30 Has bath chair, raised commode seat, rolling walker, and cane at home.   HHPT will be coming out to house.  I spent >45 mins with this pt, >50% was face to face counselling. Review of Systems    See HPI Objective:   Physical Exam  Constitutional: She appears well-developed and well-nourished.  Neck: No thyromegaly present.  Cardiovascular: Normal rate, regular rhythm and normal heart sounds.  Exam reveals no gallop and no friction rub.   No murmur heard. Pulmonary/Chest: Effort normal and breath sounds normal. No respiratory distress. She has no wheezes. She has no rales. She exhibits no tenderness.  Lymphadenopathy:    She has no cervical adenopathy.  Skin: Skin is warm and dry.       Psoriasis plaque anterior scalp          Assessment & Plan:

## 2010-09-15 NOTE — Assessment & Plan Note (Addendum)
22/30 on MMSE. Starting aricept 5. May incr dose after 30d trial if needed.

## 2010-09-15 NOTE — Progress Notes (Signed)
  Subjective:    Patient ID: Mackenzie [name], female    DOB: Apr 18, 1928, 77 y.o.   MRN: [id number]  HPI    Review of Systems     Objective:   Physical Exam    Balance max value patientvalue  Sitting balance 1   Arise 2 Used arms  Attempts to arise 2   Immediate standing balance 2 Mildly unstable  Standing balance 1   Nudge 2   Eyes closed 1   360 degree turn 1   Sitting down 2    Gait max value patient value  Initiation of gait 1   Step length-left 1   Step length-right 1   Step height-left 1   Step height-right 1   Step symmetry 1   Step continuity 1   Path 2   Trunk 2   Walking stance  0      Mackenzie [name] used her arms to stand, was unable to lean back, discontinuous turn, externally rotated feet, forward leaning trunk, wide stance, low step height, and mildly unstable without using her walker.    Assessment & Plan:

## 2010-09-15 NOTE — Assessment & Plan Note (Signed)
Stopping intranasal calcitonin. Consider bisphosphonate.

## 2010-09-15 NOTE — Assessment & Plan Note (Signed)
Hopefully will improve symptoms with DC lasix. Can consider newer bladder antispasmodics such as Vesicare with less central action. At a later date.

## 2010-09-15 NOTE — Assessment & Plan Note (Addendum)
Controlled, no changes. Refilling Toprol. Stopping lasix. Checking lytes. Should assess for weight gain/edema at next visit. Hopefully this will also help her incontinence.

## 2010-09-15 NOTE — Assessment & Plan Note (Addendum)
A1c well controlled.  No changes in this patient.

## 2010-09-15 NOTE — Patient Instructions (Addendum)
Great to see you today. See below for medication changes. Checking some bloodwork. Your A1c level was excellent. Come back to see Dr. Deirdre Priest within a month to go over other problems.  -Dr. Karie Schwalbe. And Dr. Sheffield Slider.

## 2010-09-15 NOTE — Assessment & Plan Note (Signed)
Refilled statin, will need FLP.

## 2010-09-15 NOTE — Assessment & Plan Note (Addendum)
She did have significant pain with extension but no overt symptoms of neurogenic claudication. Should use rolling walker. HHPT will be coming out to house. Acetaminophen 650 TID.

## 2010-09-17 LAB — VITAMIN D 1,25 DIHYDROXY
Vitamin D 1, 25 (OH)2 Total: 43 pg/mL (ref 18–72)
Vitamin D2 1, 25 (OH)2: <8 pg/mL
Vitamin D3 1, 25 (OH)2: 43 pg/mL

## 2010-09-17 MED ORDER — METFORMIN HCL 1000 MG PO TABS: 1000.0000 mg | ORAL_TABLET | Freq: Two times a day (BID) | ORAL | Status: DC

## 2010-09-18 LAB — BASIC METABOLIC PANEL
BUN: 14 mg/dL (ref 6–23)
BUN: 34 mg/dL — ABNORMAL HIGH (ref 6–23)
BUN: 56 mg/dL — ABNORMAL HIGH (ref 6–23)
BUN: 6 mg/dL (ref 6–23)
BUN: 6 mg/dL (ref 6–23)
BUN: 77 mg/dL — ABNORMAL HIGH (ref 6–23)
BUN: 89 mg/dL — ABNORMAL HIGH (ref 6–23)
CO2: 24 mEq/L (ref 19–32)
CO2: 26 mEq/L (ref 19–32)
CO2: 26 mEq/L (ref 19–32)
CO2: 27 mEq/L (ref 19–32)
CO2: 29 mEq/L (ref 19–32)
Calcium: 8.5 mg/dL (ref 8.4–10.5)
Calcium: 8.5 mg/dL (ref 8.4–10.5)
Calcium: 8.6 mg/dL (ref 8.4–10.5)
Calcium: 8.9 mg/dL (ref 8.4–10.5)
Calcium: 9 mg/dL (ref 8.4–10.5)
Calcium: 9 mg/dL (ref 8.4–10.5)
Calcium: 9.2 mg/dL (ref 8.4–10.5)
Chloride: 105 mEq/L (ref 96–112)
Chloride: 96 mEq/L (ref 96–112)
Chloride: 97 mEq/L (ref 96–112)
Chloride: 98 mEq/L (ref 96–112)
Creatinine, Ser: 0.37 mg/dL — ABNORMAL LOW (ref 0.4–1.2)
Creatinine, Ser: 0.38 mg/dL — ABNORMAL LOW (ref 0.4–1.2)
Creatinine, Ser: 0.83 mg/dL (ref 0.4–1.2)
Creatinine, Ser: 0.86 mg/dL (ref 0.4–1.2)
Creatinine, Ser: 1.35 mg/dL — ABNORMAL HIGH (ref 0.4–1.2)
GFR calc Af Amer: 52 mL/min — ABNORMAL LOW (ref >60)
GFR calc Af Amer: >60 mL/min (ref >60)
GFR calc Af Amer: >60 mL/min (ref >60)
GFR calc Af Amer: >60 mL/min (ref >60)
GFR calc Af Amer: >60 mL/min (ref >60)
GFR calc non Af Amer: 37 mL/min — ABNORMAL LOW (ref >60)
GFR calc non Af Amer: 38 mL/min — ABNORMAL LOW (ref >60)
GFR calc non Af Amer: 43 mL/min — ABNORMAL LOW (ref >60)
GFR calc non Af Amer: 51 mL/min — ABNORMAL LOW (ref >60)
GFR calc non Af Amer: >60 mL/min (ref >60)
GFR calc non Af Amer: >60 mL/min (ref >60)
GFR calc non Af Amer: >60 mL/min (ref >60)
GFR calc non Af Amer: >60 mL/min (ref >60)
Glucose, Bld: 133 mg/dL — ABNORMAL HIGH (ref 70–99)
Glucose, Bld: 135 mg/dL — ABNORMAL HIGH (ref 70–99)
Glucose, Bld: 143 mg/dL — ABNORMAL HIGH (ref 70–99)
Glucose, Bld: 180 mg/dL — ABNORMAL HIGH (ref 70–99)
Glucose, Bld: 199 mg/dL — ABNORMAL HIGH (ref 70–99)
Glucose, Bld: 94 mg/dL (ref 70–99)
Potassium: 3.1 mEq/L — ABNORMAL LOW (ref 3.5–5.1)
Potassium: 3.9 mEq/L (ref 3.5–5.1)
Potassium: 4.3 mEq/L (ref 3.5–5.1)
Potassium: 4.9 mEq/L (ref 3.5–5.1)
Potassium: 5.4 mEq/L — ABNORMAL HIGH (ref 3.5–5.1)
Potassium: 5.6 mEq/L — ABNORMAL HIGH (ref 3.5–5.1)
Potassium: 5.9 mEq/L — ABNORMAL HIGH (ref 3.5–5.1)
Sodium: 132 mEq/L — ABNORMAL LOW (ref 135–145)
Sodium: 135 mEq/L (ref 135–145)
Sodium: 135 mEq/L (ref 135–145)
Sodium: 135 mEq/L (ref 135–145)
Sodium: 135 mEq/L (ref 135–145)
Sodium: 137 mEq/L (ref 135–145)
Sodium: 138 mEq/L (ref 135–145)
Sodium: 138 mEq/L (ref 135–145)

## 2010-09-18 LAB — GLUCOSE, CAPILLARY
Glucose-Capillary: 117 mg/dL — ABNORMAL HIGH (ref 70–99)
Glucose-Capillary: 125 mg/dL — ABNORMAL HIGH (ref 70–99)
Glucose-Capillary: 137 mg/dL — ABNORMAL HIGH (ref 70–99)
Glucose-Capillary: 138 mg/dL — ABNORMAL HIGH (ref 70–99)
Glucose-Capillary: 141 mg/dL — ABNORMAL HIGH (ref 70–99)
Glucose-Capillary: 142 mg/dL — ABNORMAL HIGH (ref 70–99)
Glucose-Capillary: 144 mg/dL — ABNORMAL HIGH (ref 70–99)
Glucose-Capillary: 145 mg/dL — ABNORMAL HIGH (ref 70–99)
Glucose-Capillary: 151 mg/dL — ABNORMAL HIGH (ref 70–99)
Glucose-Capillary: 151 mg/dL — ABNORMAL HIGH (ref 70–99)
Glucose-Capillary: 152 mg/dL — ABNORMAL HIGH (ref 70–99)
Glucose-Capillary: 152 mg/dL — ABNORMAL HIGH (ref 70–99)
Glucose-Capillary: 154 mg/dL — ABNORMAL HIGH (ref 70–99)
Glucose-Capillary: 155 mg/dL — ABNORMAL HIGH (ref 70–99)
Glucose-Capillary: 156 mg/dL — ABNORMAL HIGH (ref 70–99)
Glucose-Capillary: 162 mg/dL — ABNORMAL HIGH (ref 70–99)
Glucose-Capillary: 162 mg/dL — ABNORMAL HIGH (ref 70–99)
Glucose-Capillary: 163 mg/dL — ABNORMAL HIGH (ref 70–99)
Glucose-Capillary: 164 mg/dL — ABNORMAL HIGH (ref 70–99)
Glucose-Capillary: 172 mg/dL — ABNORMAL HIGH (ref 70–99)
Glucose-Capillary: 176 mg/dL — ABNORMAL HIGH (ref 70–99)
Glucose-Capillary: 177 mg/dL — ABNORMAL HIGH (ref 70–99)
Glucose-Capillary: 182 mg/dL — ABNORMAL HIGH (ref 70–99)
Glucose-Capillary: 198 mg/dL — ABNORMAL HIGH (ref 70–99)

## 2010-09-18 LAB — CBC
HCT: 37.5 % (ref 36.0–46.0)
HCT: 37.9 % (ref 36.0–46.0)
HCT: 39.8 % (ref 36.0–46.0)
HCT: 41.3 % (ref 36.0–46.0)
Hemoglobin: 11.3 g/dL — ABNORMAL LOW (ref 12.0–15.0)
Hemoglobin: 12.5 g/dL (ref 12.0–15.0)
Hemoglobin: 12.9 g/dL (ref 12.0–15.0)
Hemoglobin: 13.4 g/dL (ref 12.0–15.0)
MCV: 73.4 fL — ABNORMAL LOW (ref 78.0–100.0)
MCV: 75 fL — ABNORMAL LOW (ref 78.0–100.0)
Platelets: 252 10*3/uL (ref 150–400)
Platelets: 283 10*3/uL (ref 150–400)
Platelets: 322 10*3/uL (ref 150–400)
RBC: 5.16 MIL/uL — ABNORMAL HIGH (ref 3.87–5.11)
RBC: 5.22 MIL/uL — ABNORMAL HIGH (ref 3.87–5.11)
RBC: 5.55 MIL/uL — ABNORMAL HIGH (ref 3.87–5.11)
RDW: 19.2 % — ABNORMAL HIGH (ref 11.5–15.5)
RDW: 19.4 % — ABNORMAL HIGH (ref 11.5–15.5)
RDW: 20.2 % — ABNORMAL HIGH (ref 11.5–15.5)
WBC: 10.8 10*3/uL — ABNORMAL HIGH (ref 4.0–10.5)
WBC: 7.8 10*3/uL (ref 4.0–10.5)
WBC: 8.4 10*3/uL (ref 4.0–10.5)

## 2010-09-18 LAB — RETICULOCYTES: Retic Ct Pct: 0.5 % (ref 0.4–3.1)

## 2010-09-18 LAB — IRON AND TIBC
Iron: 46 ug/dL (ref 42–135)
Saturation Ratios: 19 % — ABNORMAL LOW (ref 20–55)
UIBC: 191 ug/dL

## 2010-09-18 LAB — COMPREHENSIVE METABOLIC PANEL
ALT: 9 U/L (ref 0–35)
AST: 14 U/L (ref 0–37)
Albumin: 3.3 g/dL — ABNORMAL LOW (ref 3.5–5.2)
CO2: 23 mEq/L (ref 19–32)
Chloride: 94 mEq/L — ABNORMAL LOW (ref 96–112)
GFR calc Af Amer: >60 mL/min (ref >60)
GFR calc non Af Amer: 57 mL/min — ABNORMAL LOW (ref >60)
Sodium: 130 mEq/L — ABNORMAL LOW (ref 135–145)
Total Bilirubin: 0.6 mg/dL (ref 0.3–1.2)

## 2010-09-18 LAB — VITAMIN B12: Vitamin B-12: 417 pg/mL (ref 211–911)

## 2010-09-18 LAB — FERRITIN: Ferritin: 128 ng/mL (ref 10–291)

## 2010-09-18 LAB — VITAMIN D 25 HYDROXY (VIT D DEFICIENCY, FRACTURES): Vit D, 25-Hydroxy: 10 ng/mL — ABNORMAL LOW (ref 30–89)

## 2010-10-03 LAB — URINALYSIS, ROUTINE W REFLEX MICROSCOPIC
Ketones, ur: 15 mg/dL — AB
Leukocytes, UA: NEGATIVE
Protein, ur: NEGATIVE mg/dL
Urobilinogen, UA: 0.2 mg/dL (ref 0.0–1.0)

## 2010-10-03 LAB — BASIC METABOLIC PANEL
CO2: 30 mEq/L (ref 19–32)
Calcium: 8.4 mg/dL (ref 8.4–10.5)
Creatinine, Ser: 0.35 mg/dL — ABNORMAL LOW (ref 0.4–1.2)
Glucose, Bld: 157 mg/dL — ABNORMAL HIGH (ref 70–99)

## 2010-10-03 LAB — COMPREHENSIVE METABOLIC PANEL
ALT: 16 U/L (ref 0–35)
Calcium: 8.8 mg/dL (ref 8.4–10.5)
Creatinine, Ser: 0.36 mg/dL — ABNORMAL LOW (ref 0.4–1.2)
Glucose, Bld: 151 mg/dL — ABNORMAL HIGH (ref 70–99)
Sodium: 138 mEq/L (ref 135–145)
Total Protein: 7 g/dL (ref 6.0–8.3)

## 2010-10-03 LAB — DIFFERENTIAL
Eosinophils Absolute: 0.1 10*3/uL (ref 0.0–0.7)
Lymphocytes Relative: 12 % (ref 12–46)
Lymphs Abs: 1.2 10*3/uL (ref 0.7–4.0)
Monocytes Relative: 5 % (ref 3–12)
Neutro Abs: 8.3 10*3/uL — ABNORMAL HIGH (ref 1.7–7.7)
Neutrophils Relative %: 82 % — ABNORMAL HIGH (ref 43–77)

## 2010-10-03 LAB — POCT CARDIAC MARKERS
CKMB, poc: 1.4 ng/mL (ref 1.0–8.0)
Myoglobin, poc: 80.7 ng/mL (ref 12–200)
Troponin i, poc: <0.05 ng/mL (ref 0.00–0.09)

## 2010-10-03 LAB — GLUCOSE, CAPILLARY
Glucose-Capillary: 140 mg/dL — ABNORMAL HIGH (ref 70–99)
Glucose-Capillary: 170 mg/dL — ABNORMAL HIGH (ref 70–99)
Glucose-Capillary: 193 mg/dL — ABNORMAL HIGH (ref 70–99)

## 2010-10-03 LAB — HEMOGLOBIN A1C: Hgb A1c MFr Bld: 7.1 % — ABNORMAL HIGH (ref 4.6–6.1)

## 2010-10-03 LAB — URINE CULTURE: Colony Count: >=100000

## 2010-10-03 LAB — URINE MICROSCOPIC-ADD ON

## 2010-10-03 LAB — CBC
Hemoglobin: 12.9 g/dL (ref 12.0–15.0)
MCHC: 32.7 g/dL (ref 30.0–36.0)
RDW: 18.9 % — ABNORMAL HIGH (ref 11.5–15.5)

## 2010-10-04 LAB — URINE MICROSCOPIC-ADD ON

## 2010-10-04 LAB — DIFFERENTIAL
Basophils Relative: 1 % (ref 0–1)
Monocytes Relative: 8 % (ref 3–12)
Neutro Abs: 6.1 10*3/uL (ref 1.7–7.7)
Neutrophils Relative %: 75 % (ref 43–77)

## 2010-10-04 LAB — URINALYSIS, ROUTINE W REFLEX MICROSCOPIC
Hgb urine dipstick: NEGATIVE
Nitrite: NEGATIVE
Specific Gravity, Urine: 1.028 (ref 1.005–1.030)
Urobilinogen, UA: 1 mg/dL (ref 0.0–1.0)

## 2010-10-04 LAB — COMPREHENSIVE METABOLIC PANEL
Alkaline Phosphatase: 89 U/L (ref 39–117)
BUN: 9 mg/dL (ref 6–23)
Calcium: 8.9 mg/dL (ref 8.4–10.5)
Glucose, Bld: 131 mg/dL — ABNORMAL HIGH (ref 70–99)
Potassium: 3.2 mEq/L — ABNORMAL LOW (ref 3.5–5.1)
Total Protein: 6.9 g/dL (ref 6.0–8.3)

## 2010-10-04 LAB — CBC
HCT: 39.5 % (ref 36.0–46.0)
Hemoglobin: 12.9 g/dL (ref 12.0–15.0)
MCHC: 32.7 g/dL (ref 30.0–36.0)
RDW: 19.1 % — ABNORMAL HIGH (ref 11.5–15.5)

## 2010-10-05 ENCOUNTER — Telehealth: Payer: Self-pay | Admitting: *Deleted

## 2010-10-05 NOTE — Telephone Encounter (Signed)
Pt has a stage II ulcer on her left buttock.  They are going to put a duoderm on it but just wanted to let the MD know.  Chambliss was gone for the evening.  Will forward message.

## 2010-10-09 LAB — URINALYSIS, ROUTINE W REFLEX MICROSCOPIC
Glucose, UA: NEGATIVE mg/dL
Hgb urine dipstick: NEGATIVE
Ketones, ur: NEGATIVE mg/dL
Protein, ur: NEGATIVE mg/dL

## 2010-10-09 LAB — COMPREHENSIVE METABOLIC PANEL
ALT: 19 U/L (ref 0–35)
AST: 25 U/L (ref 0–37)
Alkaline Phosphatase: 85 U/L (ref 39–117)
CO2: 27 mEq/L (ref 19–32)
GFR calc Af Amer: >60 mL/min (ref >60)
GFR calc non Af Amer: >60 mL/min (ref >60)
Glucose, Bld: 222 mg/dL — ABNORMAL HIGH (ref 70–99)
Potassium: 3.5 mEq/L (ref 3.5–5.1)
Sodium: 136 mEq/L (ref 135–145)
Total Protein: 6.6 g/dL (ref 6.0–8.3)

## 2010-10-09 LAB — DIFFERENTIAL
Basophils Relative: 0 % (ref 0–1)
Eosinophils Absolute: 0 10*3/uL (ref 0.0–0.7)
Eosinophils Relative: 0 % (ref 0–5)
Lymphs Abs: 1.3 10*3/uL (ref 0.7–4.0)
Monocytes Relative: 6 % (ref 3–12)
Neutrophils Relative %: 80 % — ABNORMAL HIGH (ref 43–77)

## 2010-10-09 LAB — URINE MICROSCOPIC-ADD ON

## 2010-10-09 LAB — CBC
Hemoglobin: 12.9 g/dL (ref 12.0–15.0)
RBC: 5.43 MIL/uL — ABNORMAL HIGH (ref 3.87–5.11)
WBC: 9.2 10*3/uL (ref 4.0–10.5)

## 2010-10-10 LAB — DIFFERENTIAL
Basophils Absolute: 0.1 10*3/uL (ref 0.0–0.1)
Eosinophils Absolute: 0 10*3/uL (ref 0.0–0.7)
Lymphs Abs: 1.4 10*3/uL (ref 0.7–4.0)
Neutro Abs: 7.4 10*3/uL (ref 1.7–7.7)

## 2010-10-10 LAB — COMPREHENSIVE METABOLIC PANEL
ALT: 15 U/L (ref 0–35)
AST: 23 U/L (ref 0–37)
Albumin: 3.2 g/dL — ABNORMAL LOW (ref 3.5–5.2)
Alkaline Phosphatase: 108 U/L (ref 39–117)
Chloride: 100 mEq/L (ref 96–112)
GFR calc Af Amer: >60 mL/min (ref >60)
Potassium: 3.6 mEq/L (ref 3.5–5.1)
Sodium: 138 mEq/L (ref 135–145)
Total Bilirubin: 0.6 mg/dL (ref 0.3–1.2)

## 2010-10-10 LAB — URINALYSIS, ROUTINE W REFLEX MICROSCOPIC
Glucose, UA: NEGATIVE mg/dL
Leukocytes, UA: NEGATIVE
Protein, ur: 100 mg/dL — AB
pH: 6 (ref 5.0–8.0)

## 2010-10-10 LAB — URINE DRUGS OF ABUSE SCREEN W ALC, ROUTINE (REF LAB)
Cocaine Metabolites: NEGATIVE
Methadone: NEGATIVE
Phencyclidine (PCP): NEGATIVE
Propoxyphene: NEGATIVE

## 2010-10-10 LAB — URINE MICROSCOPIC-ADD ON

## 2010-10-10 LAB — BASIC METABOLIC PANEL
BUN: 16 mg/dL (ref 6–23)
Creatinine, Ser: 0.35 mg/dL — ABNORMAL LOW (ref 0.4–1.2)
GFR calc Af Amer: >60 mL/min (ref >60)
GFR calc Af Amer: >60 mL/min (ref >60)
GFR calc non Af Amer: >60 mL/min (ref >60)
GFR calc non Af Amer: >60 mL/min (ref >60)
Potassium: 3.1 mEq/L — ABNORMAL LOW (ref 3.5–5.1)
Potassium: 4.1 mEq/L (ref 3.5–5.1)
Sodium: 143 mEq/L (ref 135–145)

## 2010-10-10 LAB — URINE CULTURE: Colony Count: 60000

## 2010-10-10 LAB — CBC
HCT: 33.9 % — ABNORMAL LOW (ref 36.0–46.0)
HCT: 44.2 % (ref 36.0–46.0)
Hemoglobin: 10.9 g/dL — ABNORMAL LOW (ref 12.0–15.0)
Platelets: 297 10*3/uL (ref 150–400)
Platelets: 409 10*3/uL — ABNORMAL HIGH (ref 150–400)
WBC: 6.6 10*3/uL (ref 4.0–10.5)
WBC: 9.6 10*3/uL (ref 4.0–10.5)

## 2010-10-10 LAB — POCT CARDIAC MARKERS: Troponin i, poc: <0.05 ng/mL (ref 0.00–0.09)

## 2010-10-10 LAB — HEMOGLOBIN A1C: Hgb A1c MFr Bld: 7.1 % — ABNORMAL HIGH (ref 4.6–6.1)

## 2010-10-10 LAB — RPR: RPR Ser Ql: NONREACTIVE

## 2010-10-10 LAB — FOLATE: Folate: 18.3 ng/mL

## 2010-10-10 LAB — GLUCOSE, CAPILLARY

## 2010-10-12 ENCOUNTER — Telehealth: Payer: Self-pay | Admitting: *Deleted

## 2010-10-12 NOTE — Telephone Encounter (Signed)
Physical therapist called and requests verbal authorization to continue PT twice weekly for the next 2 weeks.

## 2010-10-13 NOTE — Telephone Encounter (Signed)
Please call and give the order for 2 more weeks Thanks Lexxus Underhill L

## 2010-10-13 NOTE — Telephone Encounter (Signed)
Left message on voicemail giving verbal authorization.

## 2010-10-17 ENCOUNTER — Ambulatory Visit: Payer: Medicare Other | Admitting: Family Medicine

## 2010-10-26 ENCOUNTER — Telehealth: Payer: Self-pay | Admitting: *Deleted

## 2010-10-26 NOTE — Telephone Encounter (Signed)
Physical Therapist is requesting approval for 2 more weeks therapy.

## 2010-10-26 NOTE — Telephone Encounter (Signed)
To pcp for approval

## 2010-10-27 NOTE — Telephone Encounter (Signed)
I faxed in order [name]

## 2010-11-08 ENCOUNTER — Telehealth: Payer: Self-pay | Admitting: Family Medicine

## 2010-11-08 NOTE — Telephone Encounter (Signed)
Mackenzie [name] from [facility] called to say that her agency is seeing Mackenzie [name] 2 times a week for 4 wks for gait training & exercise and home safety.  If there are any questions or concerns you can reach her office at [phone].

## 2010-11-10 NOTE — Telephone Encounter (Signed)
Left message for Mackenzie [name] to contact Ms Mackenzie [name] and let her know she needs an office visit

## 2010-11-15 NOTE — Consult Note (Signed)
Mackenzie [name], Mackenzie [name]               ACCOUNT NO.:  [id number]   MEDICAL RECORD NO.:  [id number]          PATIENT TYPE:  INP   LOCATION:  5524                         FACILITY:  [facility]   PHYSICIAN:  Madolyn Frieze. Jens Som, MD, FACCDATE OF BIRTH:  Nov 07, 1927   DATE OF CONSULTATION:  05/04/2007  DATE OF DISCHARGE:                                 CONSULTATION   Mackenzie [name] is a 77 year old female with a past medical history of  diabetes mellitus, hypertension, and peripheral vascular disease who we  were asked to evaluate for an elevated troponin I.  She has no prior  cardiac history by report.  She denies any dyspnea on exertion,  orthopnea, PND, pedal edema, palpitations, presyncope, syncope or  exertional chest pain.  She has had several falls recently.  She has not  had syncope, but states she merely lost her balance.  She was  transferred to the [facility] emergency room and then to [facility] for further evaluation.  During that evaluation, her troponin I  has been noted to be elevated and cardiology was asked to further  evaluate.   HOME MEDICATIONS:  1. Actos 30 mg p.o. daily.  2. Aspirin 81 mg daily.  3. Enalapril 2 mg daily.  4. Glucophage 1000 mg p.o. b.i.d.  5. HCTZ 25 mg p.o. daily.  6. Glipizide 10 mg b.i.d.  7. Prilosec.   ALLERGIES:  NO KNOWN DRUG ALLERGIES.   SOCIAL HISTORY:  She does not smoke nor does she consume alcohol.   FAMILY HISTORY:  Her father had a myocardial infarction at age 96.   PAST MEDICAL HISTORY:  1. Diabetes mellitus.  2. Hypertension but she denies any hyperlipidemia.  3. History of psoriasis.  4. Degenerative joint disease.  5. She has a history of peripheral disease and has had previous      aortofemoral bypass.  I do not have those records available.  6. History of gastroesophageal reflux disease.  7. History of gout.   REVIEW OF SYSTEMS:  She denies any headaches or fevers or chills.  There  is no productive cough or hemoptysis.   There is no dysphagia,  odynophagia, melena, hematochezia.  There is no dysuria or hematuria.  There is no rash or seizure activity.  There is no orthopnea, PND or  pedal edema.  The remaining systems are negative other than fatigue.   PHYSICAL EXAMINATION:  GENERAL:  She is well-developed and somewhat  obese.  She does not appear to be in acute distress.  Skin is warm and  dry.  She does have a rash in her groins bilaterally.  She does not  appear to be depressed, although she is somewhat frustrated with being  in the hospital and prefers not to answer questions.  HEENT:  Normal with normal eyelids.  NECK:  Supple with a normal upstroke bilaterally.  I cannot appreciate  bruits.  There is no jugular distention.  No thyromegaly is noted.  CHEST:  Clear to auscultation with normal expansion.  CARDIOVASCULAR:  Regular rate and rhythm.  Normal S1-S2.  There are no  murmurs,  rubs or gallops noted.  ABDOMEN:  Nontender, nondistended.  Positive bowel sounds.  No  hepatosplenomegaly.  No mass appreciated.  There is no abdominal bruit.  She has 1+ femoral pulses bilaterally.  No bruits.  EXTREMITIES:  Trace edema bilaterally, but there are no cords palpated.  Her distal pulses are diminished.  NEUROLOGIC:  Grossly intact.  VITAL SIGNS:  Her temperature was 97.4 with a pulse of 89 and blood  pressure of 105/59.  She is 94% room air.   Her cardiac markers are significant for a troponin I of 0.39 and 0.34.  Her CK MBs are normal.  Her TSH is normal.  Her BUN and creatinine are  16 and 0.7 respectively with a potassium 3.5.  Her hemoglobin and  hematocrit are 11.4 and 35.3 respectively.  Her MCV is decreased at  72.3.  Her LFTs are normal.  Electrocardiogram shows sinus tachycardia  with inferolateral ST depression.   DIAGNOSES:  1. Elevated troponin I.  I am unclear about the significance of this.      She is not having chest pain, shortness of breath or syncope.  We      will plan to  continue to cycle enzymes and check an echocardiogram.      If there is no further trend up in her enzymes and her wall motion      is normal on echocardiogram, then will plan to risk stratify with a      Myoview.  If she has wall motion abnormalities or her enzymes trend      higher, then we will plan to proceed with a cardiac      catheterization.  She certainly is at risk for having vascular      disease, given her multiple risk factors and documented history of      peripheral vascular disease.  In the meantime, I would recommend      continuing with her aspirin, Lopressor, an ACE inhibitor, and she      will need a statin long term, as well.  2. Hypertension.  Her blood pressure appears to be adequately      controlled on present medications.  3. Diabetes mellitus - per her primary care service.   We will be happy to follow while she is in the hospital.      Madolyn Frieze. Jens Som, MD, [facility]  Electronically Signed     BSC/MEDQ  D:  05/04/2007  T:  05/06/2007  Job:  [id number]

## 2010-11-15 NOTE — H&P (Signed)
Mackenzie [name], GOEHRING               ACCOUNT NO.:  [id number]   MEDICAL RECORD NO.:  [id number]          PATIENT TYPE:  INP   LOCATION:  5524                         FACILITY:  [facility]   PHYSICIAN:  Alanson Puls, M.D.    DATE OF BIRTH:  May 24, 1928   DATE OF ADMISSION:  05/04/2007  DATE OF DISCHARGE:                              HISTORY & PHYSICAL   PRIMARY CARE PHYSICIAN:  Pearlean Brownie, M.D., [facility].   CHIEF COMPLAINT:  Weak/dehydration/falls.   HISTORY OF PRESENT ILLNESS:  Mackenzie [name] is a 77 year old who states she  presented to the emergency department today secondary to increasing  falls, 3-4 falls over the last week.  At baseline, the patient ambulates  with a cane.  She denies any loss of consciousness, no chest pain, no  shortness of breath, and denies any injuries secondary to Mackenzie falls.  She has also been constipated, with Mackenzie last bowel movement 1 week ago.  She states that she has seen some blood streaks in Mackenzie stool but no  frank hematochezia or melena.  She denies any fever, no abdominal pain,  no nausea and vomiting.  She lives with Mackenzie [name] and states that Mackenzie  [name] is unable to lift Mackenzie when she falls so she ends up calling EMS.  The patient was transferred from [facility] emergency department, and I  was told by the emergency department physician that the patient had  severe dehydration and was unable to ambulate.  The patient states that  she thinks Mackenzie falls are related to losing Mackenzie balance.  She denies any  vertigo.   PAST MEDICAL HISTORY:  1. Diabetes type 2, non-insulin-dependent.  2. Degenerative joint disease.  3. Hypertension.  4. Hypercholesterolemia.  5. Incontinence.  6. Psoriasis.   MEDICATIONS:  1. Actos 30 mg p.o. daily.  2. Aspirin 81 mg p.o. daily.  3. Enalapril 10 mg p.o. daily.  4. Glucophage 1000 mg p.o. b.i.d.  5. HCTZ 25 mg p.o. daily.  6. Triamcinolone 0.5% ointment to affected areas.  7. Glipizide 10 mg  p.o. b.i.d.  8. Prilosec 1 tablet daily.  9. Colchicine 0.6 mg daily.  10.The patient has been on prednisone in the past for Mackenzie psoriasis,      last dose September of 2008.  This is per Dr. Kellie Simmering.   The patient did not bring medications with Mackenzie today, and these  medications need to be confirmed with Mackenzie [facility] in  [place]   ALLERGIES:  NO KNOWN DRUG ALLERGIES.   FAMILY HISTORY:  Mackenzie [name] deceased at age 53 from stroke.  Father deceased  at age 16 secondary to MI.  The patient has 2 sisters who are deceased  and 1 sister who lives in [place].   SOCIAL HISTORY:  The patient lives in [place], [place] and  has 2 adopted children, 1 [name] in [place] and 1 [name]  She has remote alcohol history.  No tobacco use.  Able to  perform at ADLs with no assist.  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   PHYSICAL EXAMINATION:  VITAL SIGNS:  Temperature 97-98.3, blood pressure  146-149/64-73, heart rate 109-124, respirations 17-24, 90% on room air.  GENERAL:  The patient is alert, awake, and oriented x4.  She is  pleasant.  HEENT:  Pupils equal, round, and reactive to light and accommodation.  Extraocular muscles intact.  Dry mucous membranes noted.  CARDIAC:  Regular rate and rhythm with no murmurs.  PULMONARY:  Occasional decreased breath sounds in the left upper low  lobe.  ABDOMEN:  Obese with excoriation around Mackenzie navel with some scaling.  She does have positive bowel sounds.  She is nontender.  EXTREMITIES:  Noted for 1+ dorsalis pulses and thickened toe nails.  No  ulcerations seen.  RECTAL:  External hemorrhoids which are noninflamed.  There is hard  stool palpated in the vault.   LABORATORY DATA:  White blood cell count 14.2, hemoglobin 12, platelets  270, 90% segs.  Sodium 140, potassium 4.6, chloride 100, bicarb 28, BUN  14, creatinine 0.8, glucose 417.  Albumin 2.6, AST 31, ALT 17.  Urinalysis showed a  specific gravity of 1030, greater than 1000 glucose,  trace ketones, greater than 300 protein, negative nitrite, leukocyte  esterase, few epithelial cells, 0-2 white blood cells, with hyaline  casts.  CK 28.  Acute abdominal series with no bowel obstruction,  significant for constipation and hiatal hernia.  Blood acetone was  negative.   ASSESSMENT:  64. 77 year old with increasing falls.  This is likely secondary to      deconditioning.  We will obtain a physical therapy/occupational      therapy consult, cycle Mackenzie cardiac enzymes every 8 hours x2, and      check an electrocardiogram.  We will obtain orthostatic vital      signs.  2. Leukocytosis with left shift, uncertain etiology.  Urinalysis is      okay, and the patient does not admit to any dysuria.  We will check      a chest x-ray and follow Mackenzie abdominal exam.  Consider further      imaging if needed if the patient does spike a fever, low threshold      for obtaining blood cultures, urine cultures, etc.  3. Constipation.  Assure that the patient's medications are not      contributing, and it does not seem to be that way.  We will place      Mackenzie on sorbitol 60 milliliters orally as needed and give a Therevac      enema and start Colace.  She may need further manual disimpaction.  4. Diabetes type 2.  Will check a hemoglobin A1c, continue Mackenzie home      medications except for metformin, given that she may have imaging      in the future.  We will cover Mackenzie with sliding scale insulin, check      sugars in the morning and evening.  5. Hypertension.  We will rehydrate and re-add Mackenzie angiotensin-      converting enzyme and hydrochlorothiazide when appropriate and      check a thyroid-stimulating hormone.  6. Proteinuria.  We will check a protein to creatinine ratio to assure      the patient is not in nephrotic range.  7. Fluids, electrolytes, and nutrition.  Run normal saline at 100 an      hour overnight.  Electrolytes are stable.   Place on a clear diet  and advance as tolerated.   DISPOSITION:  Pending PT/OT evaluation.  Rule out life-threatening  causes of falls and assure that the patient has a safe home environment  to return to.      Alanson Puls, M.D.  Electronically Signed     MR/MEDQ  D:  05/04/2007  T:  05/05/2007  Job:  [id number]

## 2010-11-15 NOTE — H&P (Signed)
[name], [name]               ACCOUNT NO.:  [id number]   MEDICAL RECORD NO.:  [id number]          PATIENT TYPE:  INP   LOCATION:  4713                         FACILITY:  [facility]   PHYSICIAN:  Mackenzie [name], M.D.DATE OF BIRTH:  1927-09-12   DATE OF ADMISSION:  12/05/2008  DATE OF DISCHARGE:                              HISTORY & PHYSICAL   REASON FOR HOSPITALIZATION:  Confusion, found by [facility] police  driving erratically   HISTORY OF PRESENT ILLNESS:  The patient is a 77 year old female with a  history of type 2 diabetes who presented to [facility] emergency  department initially after being found by [employer] and erratically.  Of note, the patient was actually stopped by  [facility] police earlier the night previous to this and was allowed to  go on her way.  The patient continued driving throughout the night  apparently and was found again by police as already mentioned.  When she  was found by police the second time the patient was found to be confused  and not knowing where she was or how to get home.  At that time she was  also found to be incontinent of urine and stool.  She was, therefore,  transferred to [facility] emergency department for further evaluation.  At [facility] the patient smelled strongly of urine and was found to be  somewhat confused still. Per my discussion with emergency room doctor,  Dr. Rito [name], the patient was oriented to the year and to place, her  primary care Mackenzie [name], but she was not oriented to the month or the day  of the week.  On arrival to [facility] after midnight the  patient was in no acute distress.  She denies any pain complaints to me.  She reports that she did get confused while driving but states that this  was simply a matter of not being able to find her way home.  When I  asked why she drove throughout the night she states simply that she  could not get back home as she did not know  the way. When asked why she  became incontinent of urine and stool she reports that it happened by  accident and once it happened she did not feel comfortable seeking help  given her soiled state.  The patient denies any pain.  She denies any  auditory or visual hallucinations.  She denies any previous history of  mental disorders.   ALLERGIES:  No known drug allergies.   MEDICATIONS:  The patient states that she is not taking some of the  medications that she had previously been prescribed but per her  medication list she is on aspirin daily 81 mg p.o. She is on metformin  1000 mg p.o. b.i.d. She is on glipizide 5 mg p.o. daily.  She is on  simvastatin daily as well.  Please note that at the time of this  dictation since the EMR is down and cannot be accessed, the patient's  history is being dictated from memory from what I remember from previous  review  of her record.   PAST MEDICAL HISTORY:  Significant for  1. Type 2 diabetes on oral antihypoglycemic.  2. Hyperlipidemia.  3. The patient has a history of falls for which she has received CT      scan evaluation in 2008 which was unremarkable.  4. History of incontinence.  The patient states she wears a pad at      baseline for occasional accidents.  5. Degenerative joint disease.  6. Psoriasis.   FAMILY HISTORY:  Noncontributory.   SOCIAL HISTORY:  The patient lives with her husband, Mackenzie [name].  His phone  number is [phone].  The patient also has contact with a niece, Mackenzie [name] [phone].  Apparently the patient's husband, Mackenzie [name], who could  not be contacted because of late hour, has some baseline dementia and he  handles the cooking duties for the household.  The patient denies any  history of alcohol use.  She quit smoking about 10 years ago.  She  denies any drug use.   PAST MEDICAL HISTORY:  The patient denies any previous mental health  disorders except for depression which has resolved many years ago.   REVIEW OF  SYSTEMS:  The patient denies any headache, vision changes,  chest pain, shortness of breath, nausea or vomiting.  She reports good  oral intake except for a period in which she was driving which extended  from yesterday evening overnight and into today.  The patient does  report watery stools starting yesterday which contributed to her episode  of incontinence while she was driving.  The patient denies any fevers or  chills.  The patient does report an area of skin breakdown over her  sacrum which she states has not been bothering her except for when she  became incontinent with this presentation.  The patient does report that  her right lower extremity is more swollen than her left lower extremity  and this has been the case for the last couple of weeks. She states that  her legs are symmetric at baseline.  She denies any calf warmth or  tenderness in that area.  The patient does state that she has difficulty  maintaining balance and has had multiple falls in the past.   PHYSICAL EXAM:  VITAL SIGNS:  Currently unavailable to me at this time  as they are not listed in the chart nor available for our system here at  the hospital as they are normally taken by the nursing staff. But per  report from the emergency department physician the patient is afebrile  and with normal heart rate and normal blood pressures.  Oxygen  saturations are stable on room air.  GENERAL:  The patient is in no acute distress.  She is alert.  She is  cooperative interactive with exam.  HEENT: Pupils are equal bilaterally.  Sclerae are clear except for some  scleral edema of the left medial eye.  Extraocular movements are intact.  Mucous membranes are mildly dry with dry flaking skin.  NECK: No carotid bruits bilaterally.  CARDIOVASCULAR:  Regular rate and rhythm.  There is 2/6 systolic  ejection murmur heard best at the left lower sternal border.  LUNGS:  Clear to auscultation bilaterally.  Work of breathing is   unlabored.  ABDOMEN:  Positive bowel sounds, soft, nontender, nondistended.  No  rebound or guarding.  EXTREMITIES:  With 2+ dorsalis pedis and radial pulses.  Right lower  extremity has some nonpitting edema that is asymmetric compared to the  left lower extremity.  There is no calf tenderness or warmth. The  patient moves this leg without difficulty.  No bands are noted on the  right lower extremity as well.  SKIN:  There is an area of breakdown on the sacrum that qualifies as a  stage II ulcer. This looks like a pressure ulcer by exam.  There is no  active drainage from this area.  NEURO: The patient is alert and oriented to person, place, month and  year. Also to the day of the week at this time.  Cranial nerves II-XII  are intact.  The patient has good movement of the upper and lower  extremities.  She has 5/5 bilateral biceps and triceps strength.  She  has 4+/5 bilateral hip flexor strength.  She has good plantar flexion  and dorsiflexion of bilateral feet.  The patient is able to ambulate  from the bed to a standing position, albeit with great difficulty, and  she does represent a fall risk by what I can see on exam.  PSYCH:  The patient is alert and oriented x3 as already stated.  She has  a full affect.  She is very conversant and talkative. However, I would  not say that her speech is pressured.  She does not exhibit any apparent  signs of psychosis. Her responses to questions often become  circumstantial, but she does not become tangential.  The patient  exhibits good abstraction of thought, being able to explain judge a  book by its cover and skin a cat.  She is able to add 5 and 5.  She  is able to strike 7 from 100, but not 7 from 93 or 86 accurately.  She  is able to spell world backwards.  The patient denies any suicidal,  homicidal ideation.  The patient indeed seems to interact fairly  normally, but on continued discussion with the patient we do get the  impression  that she is overall confused, although she does seem to  compensate well for this confusion.   PERTINENT LABS AND STUDIES:  1. A CT of the head is negative for any acute abnormalities, shows      stable atrophy white matter disease.  2. Chest x-ray shows a moderate hiatal hernia, is otherwise negative      for any acute abnormality.  3. Urinalysis which per report was a cath UA specimen showed specific      gravity of 1.015, small bilirubin, 15 ketones, large blood, 100      protein, is negative for nitrite and leukocytes. Microscopy shows      too many red blood cells to count, 0-2 white blood cells, and rare      bacteria.  There are a few epithelial cells in the specimen.  4. CBC shows a white blood cell count 9.6, hemoglobin 14, platelet      count 409.  5. Complete metabolic panel is entirely within normal limits except      for glucose of 191, albumin of 3.2.  6. Point of care cardiac markers were negative in the emergency      department at [facility].  7. EKG showed sinus rhythm with 104 beats per minute, no ST or T-wave      abnormalities, generally unremarkable.   ASSESSMENT/PLAN:  A 77 year old female with history of diabetes and  falls who presents with confusion after being found driving aimlessly by  [employer].  1. Confusion and altered mental status.  I was able to talk with the      patient's niece, Mackenzie [name], one phone.  She states that this      episode is unusual for the patient although she does have a      tendency to become confused at times.  Miss Larinda Buttery reports 1 other      episode of leaving the house unexpectedly with the patient becoming      lost while she was driving.  It is unclear as to the etiology for      this behavior at this time.  Given the negative head CT and normal      neuro exam I do not favor any intracerebral pathology such as      hemorrhagic or ischemic stroke at this time.  Rather I fel there is      some sort of  psychiatric disturbance, although the patient does not      exhibit history of this and it would be unusual for such an issue      to present at such a late age.  Also possibly contributing to this      is dehydration.  However, it is unclear why the patient would have      left the house in such a way to began with.  Given her episode of      loose stools as she describes on review of systems this could be an      acute gastroenteritis that contributes to dehydration which      contributes to altered mental status.  Accordingly we will      rehydrate the patient for the 500 mL normal saline bolus at this      time and continue IV fluids overnight.  We will allow the patient      to eat as tolerated.  Urinalysis is unremarkable, and the patient      does not exhibit a fever or white blood cell count at this time.      MRI may be reasonable for this patient, but we will hold off on      this procedure for now until discussion in the morning as it would      not change our acute management in this situation.  Please note      that the patient's complete metabolic panel and blood glucose      levels are unremarkable.  2. Diabetes. I will continue the patient's home medications. It is      unclear at this time which one she is actually taking, but we will      put her back on her previous regimen of metformin and glipizide. We      will also check an A1c and fasting sugars in the morning.  3. Hypertension.  We will continue to follow the patient's blood      pressure while she is in the hospital. No antihypertensive at this      time.  We will continue the patient's daily aspirin.  4. FEN/GI. Fluids as above. Bolus now. Continue maintenance fluids      overnight p.o. ad lib.  Protonix prophylaxis.  5. Prophylaxis.  Heparin and Protonix. Heparin for DVT prophylaxis.  6. Right lower extremity swelling.  Given that this is asymmetric and      that the patient reports that this has only been  going on for a      couple of weeks I will proceed with lower  extremity Dopplers to      evaluate for any DVT.   DISPOSITION:  We will continue to follow the patient's mental status  while she is in-house. We will not place a Foley at this time as I do  not want to contribute to any possible delirium.  We will consider a  psychiatry consult in the morning.      Myrtie Soman, MD  Electronically Signed      Mackenzie Bumpers. Leveda Anna, M.D.  Electronically Signed    TE/MEDQ  D:  12/06/2008  T:  12/06/2008  Job:  [id number]

## 2010-11-15 NOTE — Discharge Summary (Signed)
Mackenzie [name], Mackenzie [name]               ACCOUNT NO.:  [id number]   MEDICAL RECORD NO.:  [id number]          PATIENT TYPE:  INP   LOCATION:  5524                         FACILITY:  [facility]   PHYSICIAN:  Raynelle Fanning A. Mayford Knife, M.D.DATE OF BIRTH:  05-Sep-1927   DATE OF ADMISSION:  05/04/2007  DATE OF DISCHARGE:  05/09/2007                               DISCHARGE SUMMARY   PRIMARY CARE Juluis Fitzsimmons:  Dr. Deirdre Priest at the [facility]   DISCHARGE DIAGNOSES:  1. Dyspnea.  2. Diabetes.  3. Hypertension.  4. Hypokalemia.   DISCHARGE MEDICATIONS:  1. Actos 30 mg p.o. daily.  2. Aspirin 81 mg p.o. daily.  3. Enalapril 10 mg p.o. daily.  4. Glucophage 1000 mg p.o. b.i.d.  5. Hydrochlorothiazide 25 mg p.o. daily.  6. Glipizide 10 mg p.o. b.i.d.  7. Prilosec unknown dose daily.   NEW MEDICATION:  Potassium chloride 40 mEq p.o. daily x5 days.   CONSULTS:  1. Cardio.  2. Pulmonary.   PROCEDURES:  1. Echocardiogram on November 3.  2. Myoview perfusion study on November 3.  3. Chest x-ray on November 1.  4. Acute abdomen on October 31.   LABS:  On day of discharge, the patient had a B-Met drawn:  Sodium 141,  potassium 2.8, chloride 104, bicarb 27, BUN 5, calcium 8.2, creatinine  0.46, glucose 158, magnesium is pending at this time.  TSH from May 04, 2007 was 0.800.  Hemoglobin A1c from May 04, 2007 was 8.9.  Lipid  profile from May 14, 2007:  Triglycerides 119, HDL 33, LDL 85.   BRIEF HOSPITAL COURSE:  This is a 77 year old female admitted with  multiple falls and found to have elevated cardiac enzymes on admission.   1. Cardio.  A thorough cardiac workup was performed by cardiology.      The echocardiogram and Myoview were both unremarkable and      cardiology recommends against further workup, but to continue      aspirin, stain and metoprolol as an outpatient.  2. Dyspnea.  PFTs were performed as an inpatient on May 08, 2007      and showed  obstructive, restrictive and diffusion components of      pulmonary disease.  It is unclear from these studies whether there      was a reversible component to the obstructive disease and a      bronchodilator as a clinical trial may be indicated.  The patient      gets dyspneic with very limited activity and oxygen therapy for      home was considered, however an O2 under 88% could not be      documented even with ambulation.  3. Weakness associated with multiple falls.  The patient states that      the local EMS comes to her home to help her get up off the floor      approximately twice a month; she falls and is unable to get up and      her husband is unable to help her get up.  PT/OT and social work  were involved in the discharge planning for this patient.  The      patient will go home with home health PT and bedside commode.      Occupational therapy is also doing a home safety evaluation.  4. Diabetes.  The patient's home metformin was held on admission, but      restarted on May 08, 2007.  She was sent home on all of her      oral hypoglycemics.  5. Hypertension.  The patient's enalapril and hydrochlorothiazide were      originally held, but were restarted on May 08, 2007.  She will      be sent home on her metoprolol, enalapril and hydrochlorothiazide.  6. Hyperlipidemia.  The patient is sent home on her simvastatin 80 mg      p.o. daily.  7. Hypokalemia.  The patient had a potassium of 4.6 on admission,      however by November 5, she had a potassium of 2.9 and a potassium      of 2.8 on November 6.  We began repleting her with 40 mEq of      potassium on November 5 and again on November 6.  The patient is      sent home on five days of p.o. potassium 40 mEq daily for five      days.  This is an issue that should be followed up with her primary      care physician.   DISCHARGE INSTRUCTIONS:  The patient is sent home on a diabetic diet and  with the following  limitations in her activity:  She should walk with  assistance and should use her walker at home.   FOLLOWUP ISSUES:  Include:  1. Hypokalemia.  2. The patient has a followup appointment scheduled with Dr. Deirdre Priest      at the [facility] on May 13, 2007 at      4:15 p.m.   The patient was discharged home in stable medical condition.      Romero Belling, MD  Electronically Signed      Zenaida Deed. Mayford Knife, M.D.  Electronically Signed    MO/MEDQ  D:  05/09/2007  T:  05/09/2007  Job:  [id number]   cc:   Pearlean Brownie, M.D.

## 2010-11-15 NOTE — Discharge Summary (Signed)
Mackenzie [name], Mackenzie [name]               ACCOUNT NO.:  [id number]   MEDICAL RECORD NO.:  [id number]           PATIENT TYPE:   LOCATION:                                 FACILITY:   PHYSICIAN:  Leighton Roach McDiarmid, M.D.DATE OF BIRTH:  July 14, 1927   DATE OF ADMISSION:  DATE OF DISCHARGE:                               DISCHARGE SUMMARY   DISCHARGE DIAGNOSES:  1. Dementia.  2. Delirium.  3. Diabetes mellitus, type 2.  4. Right leg swelling.  5. Incontinence.  6. Stage II pressure ulcer on the sacrum.  7. Hypertension.   DISCHARGE MEDICATIONS:  1. Aspirin 81 mg by mouth daily.  2. Enalapril 10 mg by mouth daily.  3. Hydrochlorothiazide 25 mg by mouth daily.  4. Triamcinolone 0.5% ointment to psoriasis twice daily.  5. Metformin 1000 mg by mouth twice daily.  6. Glipizide 5 mg by mouth daily.  7. Simvastatin 40 mg by mouth at bedtime.   IMAGING:  1. CT of the head without contrast.  Impression:  a.  No acute intracranial abnormality.  b.  Stable atrophy and white matter disease.  1. Chest x-ray on December 05, 2008.  Impression:  a.  No acute abnormality.  b.  Moderate-sized hiatal hernia.   LABORATORY DATA:  Urine culture showed 60,000 colonies of Klebsiella.  TSH 2.218.  BMP within normal limits with the exception of an elevated  glucose of 135.  White blood cell count 6.6, hemoglobin 10.9, hematocrit  33.9, platelets 297.  RPR nonreactive.  Folate 18.3, B12 498.  Negative  urine drug screen.  Hemoglobin A1c 7.1.  Point-of-care cardiac enzymes  negative.   BRIEF HOSPITAL COURSE:  Mackenzie [name] is a 77 year old female with a  history of diabetes and falls.  She presented with confusion after being  found driving aimlessly by the [employer].  Please see  H and P for further details.  1. The patient was admitted for confusion and altered mental status.      She was noted to have a baseline dementia.  After speaking with the      patient's niece, Nila Nephew, up over  the phone, Ms. Sharlynn Oliphant did report      one other episode of the patient leaving the house unexpectedly and      becoming lost while driving.  Please see results above for labs and      studies.  The patient had a negative CT and no focal deficits found      on neural exam.  She was noted to be incontinent of stool and urine      while driving, and her clinical exam did suggest some dehydration      upon admission.  She was rehydrated during her stay.  Morning after      admission, the patient underwent a mini mental status exam with      results in the high 20s, only with point being taken away for      missing 1/3 recall and not knowing the date of the month.  She was  alert and oriented x4, understanding why she had been admitted to      the hospital.  Her husband did come to visit her the next morning,      and after discussion, it was shown that he is the one that actually      does most of the patient's ADLs.  The patient's niece did note that      he has baseline dementia as well.  The patient did understand that      she became lost while driving and did prompt discussion about her      cessation of driving without being told by the physician.  With the      exception of some dehydration, an acute delirium causing etiology      cannot be found.  Physical therapy was consulted while the patient      was in the hospital stay.  They did recommend home health PT, OT.      We noted that the patient already has several items in place for      safety equipment in home already and did not need anymore hospital      item.  2. Diabetes mellitus, type 2:  The patient's A1c is 7.  She was      maintained on her home medications of metformin and glipizide.  She      was given a carb-modified diet and maintained on aspirin.  3. Right leg edema:  The patient's right leg was noted to be a little      bit larger than her left leg.  Lower extremity Dopplers were done      and were negative.  4.  Incontinence:  The patient was noted to wear a pad.  At baseline,      we did avoid giving her a Foley while in the hospital.  She was      monitored for diarrhea, was having slight stool incontinence, she      did not have diarrhea.  5. Hypertension:  The patient's enalapril was held while in the      hospital; however, her pressures were slightly elevated in the 130s      to 140 systolic.  This was added back upon discharge.  6. Skin:  The patient was noted to have a stage II pressure ulcer on      her sacrum.  Wound care was provided with Mepilex to the pressure      ulcer on her sacrum prior to discharge.   FOLLOWUP:  The patient was instructed follow up with her primary care  physician, Dr. Deirdre Priest, this week.   FOLLOWUP ISSUES:  1. Reevaluate the patient's dementia, consider Aricept.  2. Driving cessation.  The patient did commit to this already in the      hospital; however, D and D was not called.  3. Followup home health, PT, OT.      Helane Rima, MD  Electronically Signed      Leighton Roach McDiarmid, M.D.  Electronically Signed    EW/MEDQ  D:  12/10/2008  T:  12/11/2008  Job:  [id number]

## 2010-11-18 ENCOUNTER — Telehealth: Payer: Self-pay | Admitting: *Deleted

## 2010-11-18 NOTE — Discharge Summary (Signed)
NAME:  Mackenzie [name], Mackenzie [name]                         ACCOUNT NO.:  [id number]   MEDICAL RECORD NO.:  [id number]                   PATIENT TYPE:  INP   LOCATION:  5530                                 FACILITY:  [facility]   PHYSICIAN:  Asencion Partridge, MD                    DATE OF BIRTH:  08/24/1927   DATE OF ADMISSION:  04/24/2002  DATE OF DISCHARGE:  04/25/2002                                 DISCHARGE SUMMARY   DISCHARGE DIAGNOSES:  1. Headache, most likely migraine.  2. Persistent vomiting, now resolved.  3. Elevated liver enzymes, resolved.  4. Hyperkalemia.  5. Diabetes mellitus type 2.  6. Hypertension.  7. Hyperlipidemia.  8. Gastroesophageal reflux disease.  9. Depression.   PROCEDURE:  CT of the head which showed no acute intracranial abnormality.   CONSULTATIONS:  None.   DISCHARGE MEDICATIONS:  1. Percocet 5/325 mg q.6h. p.r.n. pain.  2. The patient to continue the rest of her home medicines as prescribed.   DISPOSITION AND FOLLOWUP:  The patient is to follow up with Dr. Deirdre Priest on  May 01, 2002 at 9:45 a.m. at the [facility].  At that time  it will be her primary physician's judgment whether to further work up this  headache if she is still having it.   BRIEF HISTORY AND PHYSICAL:  The patient is a 77 year old white female who  complained of a headache which started 2 weeks prior to admission and made  her nauseous with vomiting.  She took Tylenol No. 3 once a day that relieved  her pain somewhat.  One week prior to admission the headache and vomiting  worsened even with two Tylenol No. 3 per day.  She had photophobia and  phonophobia with the headache and described this as a left frontal sharp  pain.  She vomited after taking evening p.o. which was nonbloody and  nonbilious.  She denied fevers or abdominal pain and had no history of  migraines.  She denied any changes in her weight or any limb weakness or  history of seizures.   PERTINENT LABORATORY  DATA:  CT of the head showed no acute abnormalities.  Chest x-ray showed cardiac enlargement with probable interstitial edema.  An  abdominal series was negative for obstruction or edema.  Amylase and lipase  were normal.  A BMP was normal except for a glucose of 239.  AST was high at  73, ALT 42, alk phos 87, and total bilirubin 0.7.  Urinalysis showed glucose  at 500, greater than 80 ketones and 30 of protein.   HOSPITAL COURSE:  Problem 1. Severe headache.  With the patient's symptoms  of photophobia and phonophobia and nausea and vomiting, this was consistent  with migraine headache.  We admitted her as this is worrisome for something  more serious secondary to her age and the fact that she was vomiting  persistently.  Upon admission, Toradol was started along with frequent neuro  exams.  On hospital day #2, the patient reported that the pain medicine  helped her headache greatly and as long as she did not have the headache she  was not vomiting.  She was tolerating p.o. and the Toradol was discontinued  and she was started on Percocet p.r.n. headache.  She was stable on day of  discharge and if she continues to have the headache and vomiting she may  need further workup in the future.   Problem 2. Persistent vomiting.  The patient's vomiting came to be related  to her headaches and she stated that she had no nausea and vomiting as long  as her headache pain was controlled.  She was tolerating a regular diet on  the day of discharge and had no more difficulties.   Problem 3. Elevated liver enzymes.  Her liver enzymes on admission were  slightly elevated and a repeat CMP showed normal AST and ALT.  This was  probably felt to be secondary to her persistent vomiting and resolved.   Problem 4. Diabetes mellitus type 2.  The patient had high blood sugars upon  admission and her medications were held.  On hospital day #2, when she was  tolerating a regular diet, she was started back on  her Glucophage and the  glipizide was held until she was tolerating a full regular diet and not just  clears.  Her blood sugars remained stable throughout admission with her CBGs  129-179.   Problem 5. Hypertension.  The patient's blood pressure medicines were held  on admission as she was n.p.o. and only had IV medicines.  On hospital day  #2, her blood pressures did increase to 152-188/46-80.  At that time she was  taking a clear liquid diet and her blood pressure medicines were restarted  and she was stable upon discharge and her home medications were continued.   Problem 6. Hyperkalemia.  The patient had a potassium of 3.2 and was given K-  Dur by mouth to replace it.   OTHER PERTINENT LABORATORY DATA:  ESR was 20.     Billey Gosling, M.D.                       Asencion Partridge, MD    AS/MEDQ  D:  04/26/2002  T:  04/28/2002  Job:  [id number]   cc:   Pearlean Brownie, MD  [address] [place]  [place] [address]  Fax: [phone]

## 2010-11-18 NOTE — H&P (Signed)
NAME:  Mackenzie [name], Mackenzie [name]                         ACCOUNT NO.:  [id number]   MEDICAL RECORD NO.:  [id number]                   PATIENT TYPE:  INP   LOCATION:  5530                                 FACILITY:  [facility]   PHYSICIAN:  Billey Gosling, M.D.                 DATE OF BIRTH:  06/15/1928   DATE OF ADMISSION:  04/24/2002  DATE OF DISCHARGE:                                HISTORY & PHYSICAL   CHIEF COMPLAINT:  Vomiting.   HISTORY OF PRESENT ILLNESS:  This is a 77 year old white female with a two-  week history of headache and vomiting.  Two weeks ago, the headache began  and she had associated photophobia and phonophobia with the headache.  She  started vomiting with the pain.  She took Tylenol No. 3 once a day, which  helped relieve it somewhat.  One week ago, the headache got worse and she  began vomiting after everything she took by mouth.  The vomit was nonbloody  and nonbilious.  She describes the headache as a constant sharp pain that  was diffuse all over head and now localized to the left frontal area.  The  pain is a 15/10.  She denies any similar symptoms in the past.  She has had  headaches in the past that are relieved with Tylenol.  No fevers or chills.  No abdominal pain.  No history of migraines.   REVIEW OF SYMPTOMS:  The patient denies weight loss or fever.  No chest  pain.  No shortness of breath.  She does have a dry cough which she  attributes to her Accupril.  No constipation.  No rashes.  No limb weakness.  No joint pain.  Positive history of depression.  She does wear glasses.  Denies vision changes.  She says that she does have heat intolerance.  Positive for rhinorrhea x 1 week.  No dysuria.  No melena.   PAST MEDICAL HISTORY:  1. Diabetes mellitus, type 2.  2. Macular degeneration.  3. History of nephrolithiasis 20 years ago.  4. Hypertension.  5. Hypercholesterolemia.  6. Depression.  7. Gastrointestinal reflux disease.   PAST SURGICAL HISTORY:  1.  Cataract surgery five years ago.  2. Colonoscopy in 1997.  3. Cardiolite in July of 2000.  Results not obtainable at this time.  4. Cholecystectomy.  5. Aorta and femoral bypass surgery, per patient.   MEDICATIONS:  1. Accupril 20 mg q.d.  2. Glipizide 10 mg b.i.d.  3. Glucophage XR 1000 mg b.i.d.  4. Prevacid 30 mg q.d.  5. Prozac 10 mg q.d.  6. Zocor 10 mg q.d. began four weeks ago.  7. Tylenol No. 3 one to two q.d. x 2 weeks.   ALLERGIES:  No known drug allergies.   SOCIAL HISTORY:  The patient lives near [place], [place], in a  house with her husband, Leonette Most.  She has two children, one  in [place], and one in [place].  She is retired and works in her yard a  lot.  She does have a dog who has been sick for the past two weeks, who will  not eat.  She says that the vet does not know why and she is very upset  about this.   FAMILY HISTORY:  Positive for CVA which her mother and a sister died from.  Positive for myocardial infarction in her father at age 80.  Positive for  diabetes in a sister.  Positive for cancer in her mother and a sister for  which they had hysterectomies for, but the type of cancer was unknown.   PHYSICAL EXAMINATION:  VITAL SIGNS:  Temperature 97 degrees, pulse 80,  respirations 20, blood pressure 172/86, oxygen saturation 96% on room air.  GENERAL APPEARANCE:  This is a well-developed, well-nourished, white female  who is in no acute distress.  She is lying comfortable on the stretcher in  the emergency department.  HEENT:  Normocephalic and atraumatic.  Sclerae nonicteric.  PERRLA.  EOMI.  Nares clear without discharge.  Gums pink.  Oropharynx without erythema or  exudate.  NECK:  Supple.  Full range of motion.  No lymphadenopathy.  No  lymphadenopathy.  No thyromegaly.  No carotid bruits bilaterally.  LUNGS:  Good respiratory effort.  Positive crackles in the right base.  Left  lung clear to auscultation.  CARDIOVASCULAR:   Regular rate and rhythm without murmurs, rubs, or gallops.  ABDOMEN:  Obese and soft.  Difficult to examine for masses secondary to  obesity.  Nontender.  Normoactive bowel sounds.  EXTREMITIES:  Right lower extremity with 1+ pitting edema.  No edema on the  left lower extremity.  No clubbing or cyanosis.  Strength 5/5 bilaterally.  SKIN:  Venous stasis changes on the right lower extremity.  No other rashes  or lesions noted.  NEUROLOGIC:  Cranial nerves II-XII grossly intact.  Sensation intact  bilaterally.  Deep tendon reflexes 2+ bilaterally.  Cerebellar exam within  normal limits.  RECTAL:  Deferred.   LABORATORY DATA:  Sodium 137, potassium 3.5, chloride 99, bicarbonate 27,  BUN 8, creatinine 0.5, glucose 239, calcium 8.2.  White blood cell count  7.6, hemoglobin 12.9, hematocrit 41.5, platelets 365 with 75% neutrophils  and 18% lymphocytes.  Total protein 6.7, albumin 3.2, AST 73, ALT 42,  alkaline phosphatase 87, total bilirubin 0.7.  Amylase 53, lipase 35.  Urinalysis with specific gravity 1.022, glucose 500, greater than 80  ketones, 30 protein, trace leukocyte esterase, negative nitrites.  CT of the  head negative for acute abnormalities.  Chest x-ray shows cardiac  enlargement and probable interstitial edema.  The abdominal series was  negative for obstruction or free air.   ASSESSMENT:  This is a 77 year old white female with a history of diabetes  type 2, hypertension, and hypercholesterolemia.  She now complains of  headache and vomiting x 2 weeks.   IMPRESSION AND PLAN:  1. Headache consistent with migraine and the patient is only taking only one     to two Tylenol No. 3 tablets each day.  Will start Toradol 30 mg q.6h.     p.r.n. pain.  CT of the head was negative for acute change and the     neurological exam was nonfocal.  Will monitor the patient and if any     changes, will consider lumbar puncture for additional studies. 2. Persistent vomiting most likely  secondary to  headache.  Abdominal x-ray     ruled out obstruction.  Will start Reglan q.i.d. since there is a history     of diabetes and possible gastroparesis contributing.  Clear liquids and     advance diet as tolerated.  Pancreatitis ruled out by labs.  3. Liver function enzymes mildly elevated possibly secondary to steatosis     versus persistent vomiting versus hepatitis.  Will recheck in a.m. and if     not decreased, will check hepatitis panel.  4. Diabetes mellitus, type 2.  The patient reports blood sugars 92-150s.  On     labs, the blood sugar was 239.  Will hold home medications until the     patient is tolerating p.o.  Will check CBGs and possible add sliding     scale insulin.  5. Hypertension.  Hold Accupril until tolerating p.o. and monitor blood     pressure.                                               Billey Gosling, M.D.    AS/MEDQ  D:  04/24/2002  T:  04/24/2002  Job:  [id number]   cc:   Pearlean Brownie, MD  [address] [place]  [place] [address]  Fax: [phone]

## 2010-11-18 NOTE — Telephone Encounter (Signed)
Received call from PT assistant from [facility] stating they missed appointment with patient today. They called her and there was no answer. They are required to let MD know when appointment is missed. Will send message to MD.

## 2010-11-19 ENCOUNTER — Other Ambulatory Visit (INDEPENDENT_AMBULATORY_CARE_PROVIDER_SITE_OTHER): Payer: Medicare Other | Admitting: Family Medicine

## 2010-11-19 DIAGNOSIS — I10 Essential (primary) hypertension: Secondary | ICD-10-CM

## 2010-11-19 DIAGNOSIS — E119 Type 2 diabetes mellitus without complications: Secondary | ICD-10-CM

## 2010-11-20 NOTE — Telephone Encounter (Signed)
Refill request

## 2010-11-21 NOTE — Telephone Encounter (Signed)
To MD

## 2010-11-21 NOTE — Telephone Encounter (Signed)
Pt informed and agreeable.  Will call back in the AM to schedule appt

## 2010-11-21 NOTE — Telephone Encounter (Signed)
Mackenzie [name] with [facility] called also say that patient need her meds refilled as soon as possible. Mackenzie [name] is not taking her meds on sched and nurse have to go to the home to give them to her.   [facility] faxed or escribed request for refills.  Want to make sure provider have sent back auth for them.

## 2010-11-21 NOTE — Telephone Encounter (Signed)
Sent in refills.  Please call patient and let her know she needs an appt in the next week  Thanks  LC

## 2010-12-02 ENCOUNTER — Other Ambulatory Visit (INDEPENDENT_AMBULATORY_CARE_PROVIDER_SITE_OTHER): Payer: Medicare Other | Admitting: Family Medicine

## 2010-12-02 DIAGNOSIS — F0280 Dementia in other diseases classified elsewhere without behavioral disturbance: Secondary | ICD-10-CM

## 2010-12-02 NOTE — Telephone Encounter (Signed)
Refill request

## 2010-12-22 ENCOUNTER — Other Ambulatory Visit (INDEPENDENT_AMBULATORY_CARE_PROVIDER_SITE_OTHER): Payer: Medicare Other | Admitting: Family Medicine

## 2010-12-22 DIAGNOSIS — I10 Essential (primary) hypertension: Secondary | ICD-10-CM

## 2010-12-22 NOTE — Telephone Encounter (Signed)
Refill request

## 2011-01-07 ENCOUNTER — Other Ambulatory Visit: Payer: Self-pay | Admitting: Family Medicine

## 2011-01-08 NOTE — Telephone Encounter (Signed)
Refill request

## 2011-02-01 ENCOUNTER — Emergency Department (HOSPITAL_COMMUNITY): Payer: Medicare Other

## 2011-02-01 ENCOUNTER — Emergency Department (HOSPITAL_COMMUNITY)
Admission: EM | Admit: 2011-02-01 | Discharge: 2011-02-01 | Disposition: A | Discharge status: Home / Self Care | Payer: Medicare Other | Attending: Emergency Medicine | Admitting: Emergency Medicine

## 2011-02-01 DIAGNOSIS — Z79899 Other long term (current) drug therapy: Secondary | ICD-10-CM | POA: Insufficient documentation

## 2011-02-01 DIAGNOSIS — I10 Essential (primary) hypertension: Secondary | ICD-10-CM | POA: Insufficient documentation

## 2011-02-01 DIAGNOSIS — S0990XA Unspecified injury of head, initial encounter: Secondary | ICD-10-CM | POA: Insufficient documentation

## 2011-02-01 DIAGNOSIS — F068 Other specified mental disorders due to known physiological condition: Secondary | ICD-10-CM | POA: Insufficient documentation

## 2011-02-01 DIAGNOSIS — E119 Type 2 diabetes mellitus without complications: Secondary | ICD-10-CM | POA: Insufficient documentation

## 2011-02-01 DIAGNOSIS — Y92009 Unspecified place in unspecified non-institutional (private) residence as the place of occurrence of the external cause: Secondary | ICD-10-CM | POA: Insufficient documentation

## 2011-02-01 DIAGNOSIS — W010XXA Fall on same level from slipping, tripping and stumbling without subsequent striking against object, initial encounter: Secondary | ICD-10-CM | POA: Insufficient documentation

## 2011-02-01 DIAGNOSIS — S0180XA Unspecified open wound of other part of head, initial encounter: Secondary | ICD-10-CM | POA: Insufficient documentation

## 2011-02-01 DIAGNOSIS — R51 Headache: Secondary | ICD-10-CM | POA: Insufficient documentation

## 2011-02-08 ENCOUNTER — Ambulatory Visit (INDEPENDENT_AMBULATORY_CARE_PROVIDER_SITE_OTHER): Payer: Medicare Other | Admitting: *Deleted

## 2011-02-08 DIAGNOSIS — Z4802 Encounter for removal of sutures: Secondary | ICD-10-CM

## 2011-02-08 NOTE — Progress Notes (Signed)
Patient in office for suture and staple removal. Had laceration to left forehead on 02/01/2011. Was instructed to have sutures removed in 3-5 days.  Wound has healed well.  No redness or drainage. 2 staples removed and 6 stiches removed.  Wound cleaned with alcohol. Home wound care instructions given.

## 2011-02-08 NOTE — Progress Notes (Signed)
  Subjective:    Patient ID: Mackenzie [name], female    DOB: [birth year], 77 y.o.   MRN: [id number]  HPI    Review of Systems     Objective:   Physical Exam        Assessment & Plan:

## 2011-02-16 ENCOUNTER — Other Ambulatory Visit: Payer: Self-pay | Admitting: Family Medicine

## 2011-02-16 NOTE — Telephone Encounter (Signed)
Refill request

## 2011-02-20 ENCOUNTER — Other Ambulatory Visit: Payer: Self-pay | Admitting: Family Medicine

## 2011-02-20 ENCOUNTER — Ambulatory Visit (INDEPENDENT_AMBULATORY_CARE_PROVIDER_SITE_OTHER): Payer: Medicare Other | Admitting: Family Medicine

## 2011-02-20 ENCOUNTER — Encounter: Payer: Self-pay | Admitting: Family Medicine

## 2011-02-20 VITALS — BP 137/82 | HR 73 | Temp 98.0°F | Wt 169.0 lb

## 2011-02-20 DIAGNOSIS — E119 Type 2 diabetes mellitus without complications: Secondary | ICD-10-CM

## 2011-02-20 DIAGNOSIS — I10 Essential (primary) hypertension: Secondary | ICD-10-CM

## 2011-02-20 DIAGNOSIS — F0390 Unspecified dementia without behavioral disturbance: Secondary | ICD-10-CM

## 2011-02-20 DIAGNOSIS — M549 Dorsalgia, unspecified: Secondary | ICD-10-CM

## 2011-02-20 LAB — POCT GLYCOSYLATED HEMOGLOBIN (HGB A1C): Hemoglobin A1C: 6

## 2011-02-20 NOTE — Assessment & Plan Note (Signed)
Note from home health says they are considering ALF and that there HH will be ending.  She and husband Leonette Most seem unaware of this

## 2011-02-20 NOTE — Assessment & Plan Note (Signed)
Good control.  Need to clarify her medications

## 2011-02-20 NOTE — Progress Notes (Signed)
  Subjective:    Patient ID: Mackenzie [name], female    DOB: January 21, 1928, 77 y.o.   MRN: [id number]  HPI  HYPERTENSION Disease Monitoring: Blood pressure range-according to note from her home nurse in the 130s/70s Chest pain, palpitations- no      Dyspnea- n  Medications: Compliance- did not bring her bottles and her med list does not match that sent by her home nurse Lightheadedness,Syncope- no   Edema- no   DIABETES Disease Monitoring: Blood Sugar ranges-not checking Polyuria/phagia/dipsia- no      Visual problems- no  Medications: Compliance- can't tell seems to be on metformin Hypoglycemic symptoms- no  HYPERLIPIDEMIA Disease Monitoring: See symptoms for Hypertension  Medications: Compliance- again hard to know Right upper quadrant pain- no  Muscle aches- no  BACK PAIN Aches in mid low back.  Better with tylenol.  No weakness or radiation or incontinence.  Thinks might have started when she fell and hurt her head a few months ago.     ROS See HPI above   PMH Smoking Status noted      Review of Systems     Objective:   Physical Exam  Heart - Regular rate and rhythm.  No murmurs, gallops or rubs.    Lungs:  Normal respiratory effort, chest expands symmetrically. Lungs are clear to auscultation, no crackles or wheezes. Back - mildly tender without deformity or focality around lower LS spine.  No brusiing Able to stand with assistance and walk holding hands.  No lower extremity weakness      Assessment & Plan:

## 2011-02-20 NOTE — Telephone Encounter (Signed)
Refill request

## 2011-02-20 NOTE — Assessment & Plan Note (Signed)
Good control.  Continue current meds 

## 2011-02-20 NOTE — Assessment & Plan Note (Signed)
Likely strain or mild compression fracture.  If persists will need to check xray

## 2011-02-20 NOTE — Patient Instructions (Signed)
Come back in 1 month  Bring ALL YOUR PILL BOXES AND BOTTLES I am not sure what medications you are taking or if my list is correct   We will check your blood when you come back  Take the arthritis pain medication for your back.  If it is not helping enough then come in for a visit

## 2011-03-03 ENCOUNTER — Other Ambulatory Visit: Payer: Self-pay | Admitting: Family Medicine

## 2011-03-03 NOTE — Telephone Encounter (Signed)
Refill request

## 2011-03-07 IMAGING — CT CT CERVICAL SPINE W/O CM
4 of 6 series · 12 of 33 positions shown, 14 images · non-contrast
Comparison: CT of the head and cervical spine performed 06/02/2009

CT HEAD

CLINICAL DATA: Status post fall; head and neck pain.

CT HEAD WITHOUT CONTRAST AND CT CERVICAL SPINE WITHOUT CONTRAST
TECHNIQUE: Multidetector CT imaging of the head and cervical spine
was performed following the standard protocol without intravenous
contrast.  Multiplanar CT image reconstructions of the cervical
spine were also generated.

[Series 5: recon 2: cervical spine · axial · 0.35mm/px · z∈[-280,-213]mm · 2 of 81 slices shown, 3 images]
[im 27/81  soft-tissue]
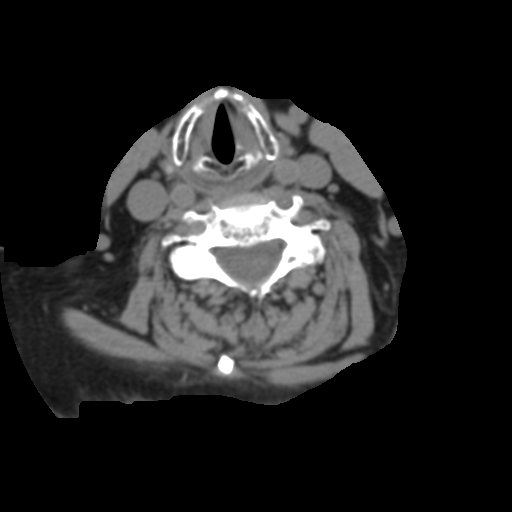
[im 27/81  bone]
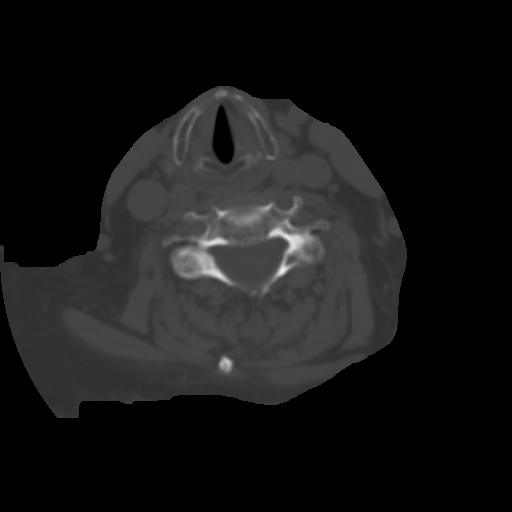
[im 54/81  bone]
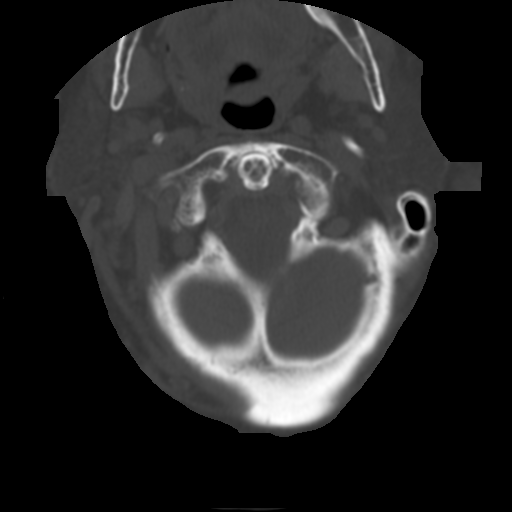

[Series 600: reformatted · coronal · 0.40mm/px · 3 of 59 slices shown (1 of 3)]
[im 12/59  bone]
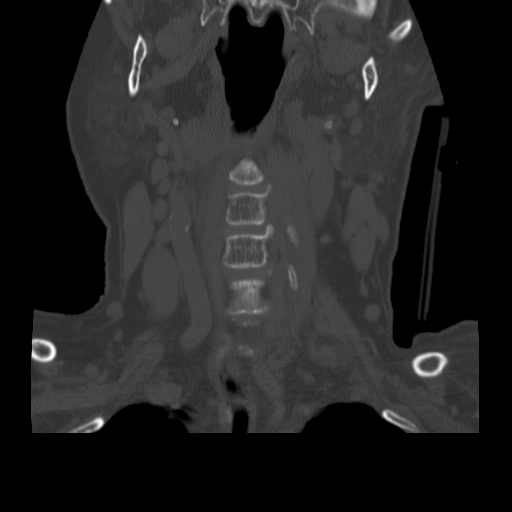
[im 24/59  bone]
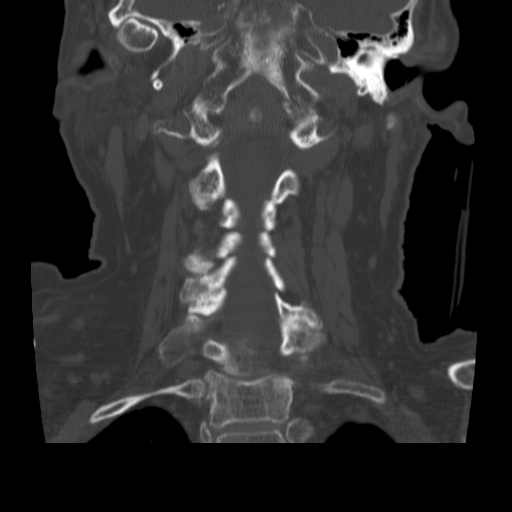
[im 35/59  bone]
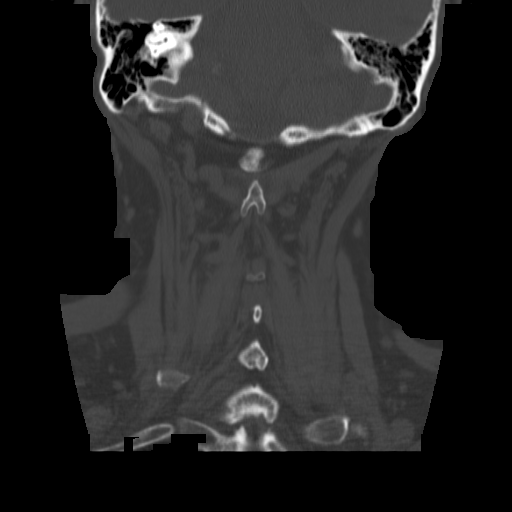

[Series 601: reformatted · sagittal · 0.40mm/px · 5 of 65 slices shown, 6 images (2 of 3)]
[im 22/65  bone]
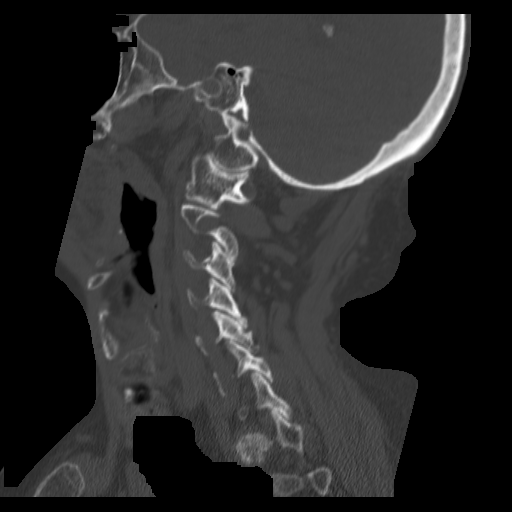
[im 27/65  bone]
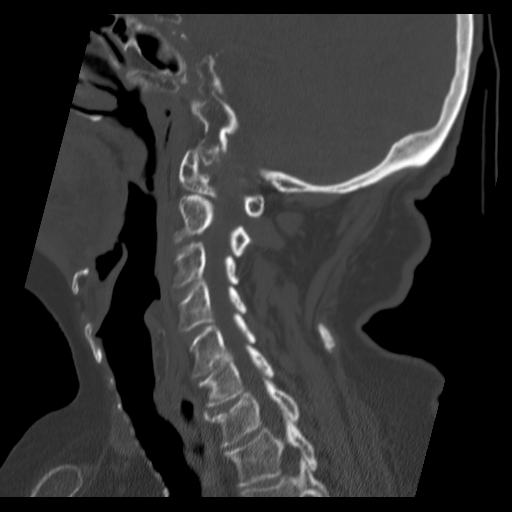
[im 33/65  soft-tissue]
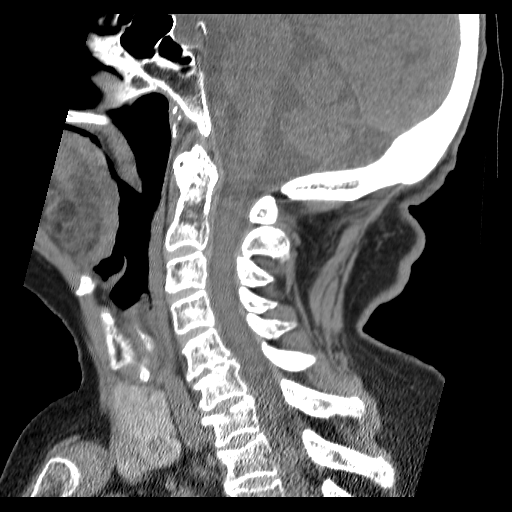
[im 33/65  bone]
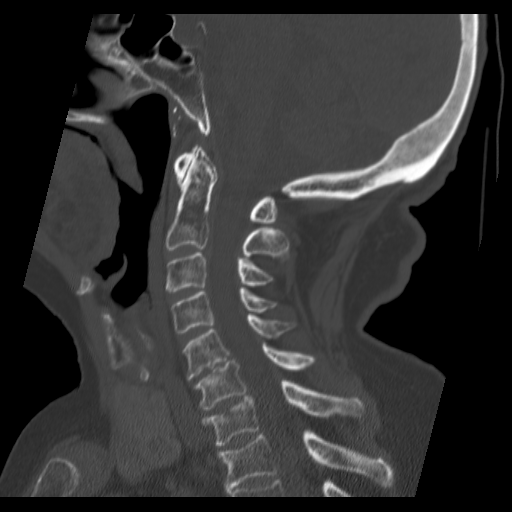
[im 38/65  bone]
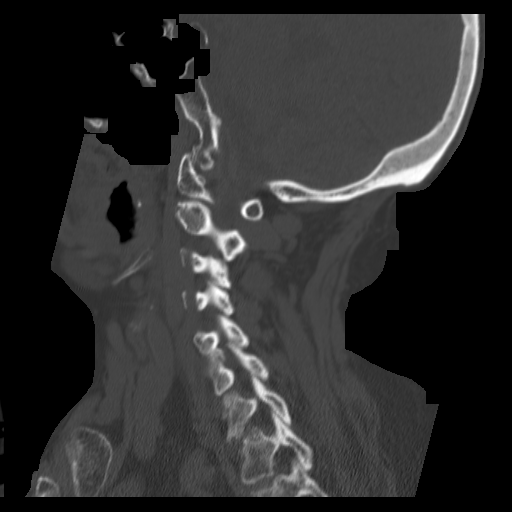
[im 43/65  bone]
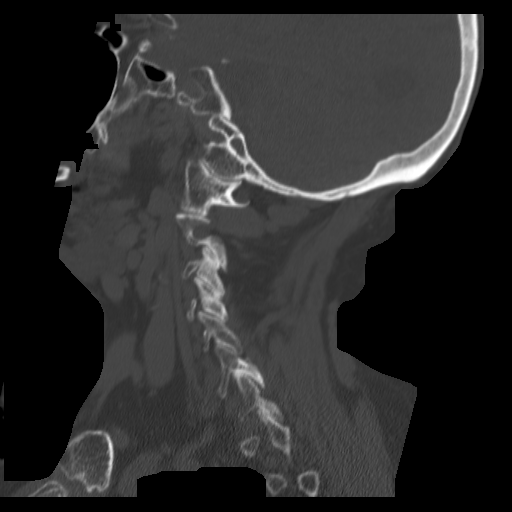

[Series 602: reformatted · axial · 0.40mm/px · z∈[-306,-257]mm · 2 of 83 slices shown (3 of 3)]
[im 28/83  bone]
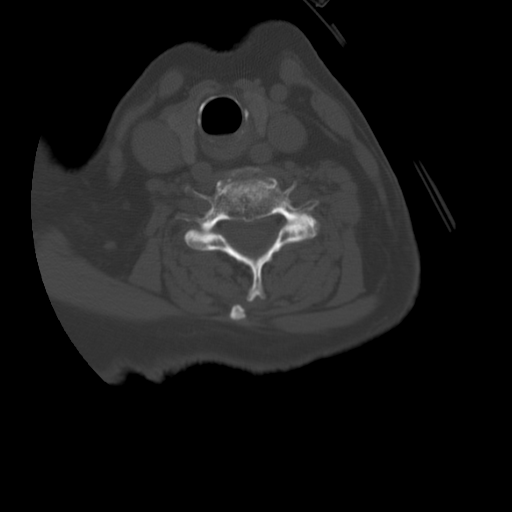
[im 55/83  bone]
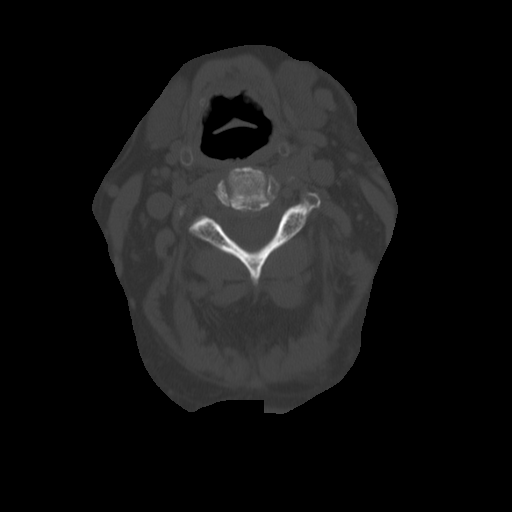

[12 of 33 positions shown; findings below may reference images not displayed]

FINDINGS: There is no evidence of acute infarction, mass lesion, or
intra- or extra-axial hemorrhage on CT.

Prominence of the ventricles and sulci reflects mild to moderate
cortical volume loss.  Scattered periventricular subcortical white
matter change likely reflects small vessel ischemic
microangiopathy.

The posterior fossa, including the cerebellum, brainstem and fourth
ventricle, is within normal limits.  The third and lateral
ventricles, and basal ganglia are unremarkable in appearance.  The
cerebral hemispheres are symmetric in appearance, with normal gray-
white differentiation.  No mass effect or midline shift is seen.

There is no evidence of fracture; hyperostosis frontalis interna is
noted.  The visualized portions of the orbits are within normal
limits.  The paranasal sinuses and mastoid air cells are well-
aerated.  Mild soft tissue calcification is noted along the
external auditory canals bilaterally.  No significant soft tissue
abnormalities are seen.
IMPRESSION: 1.  No evidence of traumatic intracranial injury or fracture.
2.  Mild to moderate cortical volume loss and small vessel ischemic
microangiopathy.

CT CERVICAL SPINE
FINDINGS: There is no evidence of acute fracture or subluxation.
There is narrowing of the intervertebral disc spaces at C5-C6 and
C6-C7, with mild chronic compression deformities of the superior
endplates of C7 and T1, unchanged from the prior study.
Degenerative change is noted about the dens.  Prevertebral soft
tissues are within normal limits.

Multiple small masses are seen bilaterally within the thyroid
gland, the most prominent of which is mildly hyperdense with
central decreased attenuation, measuring 1.6 cm in the left thyroid
lobe.  Thyroid ultrasound would be helpful for further evaluation,
as deemed clinically appropriate.  The visualized lung apices are
clear.  No significant soft tissue abnormalities are seen.
IMPRESSION: 1.  No evidence of acute fracture or subluxation.
2.  Degenerative change noted along the cervical spine, with mild
chronic compression deformities involving the superior endplates of
C7 and T1, stable from the prior study.

## 2011-03-20 ENCOUNTER — Ambulatory Visit: Payer: Medicare Other | Admitting: Family Medicine

## 2011-03-29 ENCOUNTER — Other Ambulatory Visit: Payer: Self-pay | Admitting: Family Medicine

## 2011-03-29 NOTE — Telephone Encounter (Signed)
Refill request

## 2011-04-11 LAB — BASIC METABOLIC PANEL
BUN: 5 — ABNORMAL LOW
Calcium: 8.2 — ABNORMAL LOW
Chloride: 106
Creatinine, Ser: 0.46
Creatinine, Ser: 0.7
GFR calc Af Amer: >60
GFR calc Af Amer: >60
GFR calc non Af Amer: >60
GFR calc non Af Amer: >60
GFR calc non Af Amer: >60
Glucose, Bld: 139 — ABNORMAL HIGH
Potassium: 2.8 — ABNORMAL LOW
Potassium: 3.5
Sodium: 143

## 2011-04-11 LAB — DIFFERENTIAL
Basophils Absolute: 0
Lymphocytes Relative: 15
Lymphs Abs: 1.7
Neutro Abs: 8.3 — ABNORMAL HIGH
Neutrophils Relative %: 76

## 2011-04-11 LAB — CBC
HCT: 35.3 — ABNORMAL LOW
Platelets: 272
RDW: 20.6 — ABNORMAL HIGH
WBC: 10.9 — ABNORMAL HIGH

## 2011-04-11 LAB — MAGNESIUM: Magnesium: 1.6

## 2011-04-11 LAB — TROPONIN I
Troponin I: 0.2 — ABNORMAL HIGH
Troponin I: 0.28 — ABNORMAL HIGH

## 2011-04-11 LAB — CREATININE, URINE, RANDOM: Creatinine, Urine: 180.7

## 2011-04-11 LAB — CARDIAC PANEL(CRET KIN+CKTOT+MB+TROPI)
CK, MB: 1.8
CK, MB: 2.2
Relative Index: INVALID
Total CK: 41
Total CK: 48
Troponin I: 0.39 — ABNORMAL HIGH

## 2011-04-11 LAB — CK TOTAL AND CKMB (NOT AT ARMC)
CK, MB: 1.4
Relative Index: INVALID
Total CK: 43
Total CK: 44

## 2011-04-11 LAB — LIPID PANEL
Cholesterol: 142
LDL Cholesterol: 85
Total CHOL/HDL Ratio: 4.3

## 2011-04-11 LAB — HEMOGLOBIN A1C
Hgb A1c MFr Bld: 8.9 — ABNORMAL HIGH
Mean Plasma Glucose: 240

## 2011-04-11 LAB — TSH: TSH: 0.8

## 2011-04-12 LAB — COMPREHENSIVE METABOLIC PANEL
ALT: 17
AST: 31
Alkaline Phosphatase: 80
CO2: 28
Calcium: 8.8
Chloride: 100
GFR calc Af Amer: >60
GFR calc non Af Amer: >60
Potassium: 4.6
Sodium: 140
Total Bilirubin: 0.6

## 2011-04-12 LAB — URINALYSIS, ROUTINE W REFLEX MICROSCOPIC
Leukocytes, UA: NEGATIVE
Nitrite: NEGATIVE
Specific Gravity, Urine: 1.03
Urobilinogen, UA: 0.2

## 2011-04-12 LAB — CBC
Hemoglobin: 12
RBC: 5.15 — ABNORMAL HIGH
WBC: 14.7 — ABNORMAL HIGH

## 2011-04-12 LAB — KETONES, QUALITATIVE: Acetone, Bld: NEGATIVE

## 2011-04-12 LAB — DIFFERENTIAL
Eosinophils Absolute: 0
Eosinophils Relative: 0
Lymphs Abs: 0.9

## 2011-04-12 LAB — URINE MICROSCOPIC-ADD ON

## 2011-04-26 ENCOUNTER — Ambulatory Visit: Payer: Medicare Other | Admitting: Family Medicine

## 2011-05-03 ENCOUNTER — Ambulatory Visit: Payer: Medicare Other | Admitting: Family Medicine

## 2011-05-09 ENCOUNTER — Encounter: Payer: Self-pay | Admitting: Home Health Services

## 2011-05-22 ENCOUNTER — Other Ambulatory Visit: Payer: Self-pay | Admitting: Family Medicine

## 2011-05-22 NOTE — Telephone Encounter (Signed)
Refill request

## 2011-05-24 ENCOUNTER — Ambulatory Visit (INDEPENDENT_AMBULATORY_CARE_PROVIDER_SITE_OTHER): Payer: Medicare Other | Admitting: Family Medicine

## 2011-05-24 ENCOUNTER — Encounter: Payer: Self-pay | Admitting: Family Medicine

## 2011-05-24 VITALS — BP 141/63 | HR 70 | Temp 98.3°F | Ht 65.0 in | Wt 174.0 lb

## 2011-05-24 DIAGNOSIS — E119 Type 2 diabetes mellitus without complications: Secondary | ICD-10-CM

## 2011-05-24 DIAGNOSIS — I10 Essential (primary) hypertension: Secondary | ICD-10-CM

## 2011-05-24 DIAGNOSIS — M25539 Pain in unspecified wrist: Secondary | ICD-10-CM | POA: Insufficient documentation

## 2011-05-24 DIAGNOSIS — L408 Other psoriasis: Secondary | ICD-10-CM

## 2011-05-24 LAB — CBC
HCT: 42 % (ref 36.0–46.0)
MCV: 74.2 fL — ABNORMAL LOW (ref 78.0–100.0)
RBC: 5.66 MIL/uL — ABNORMAL HIGH (ref 3.87–5.11)
RDW: 17.8 % — ABNORMAL HIGH (ref 11.5–15.5)
WBC: 6 10*3/uL (ref 4.0–10.5)

## 2011-05-24 LAB — COMPREHENSIVE METABOLIC PANEL
ALT: 13 U/L (ref 0–35)
CO2: 30 mEq/L (ref 19–32)
Creat: 0.58 mg/dL (ref 0.50–1.10)
Total Bilirubin: 0.3 mg/dL (ref 0.3–1.2)

## 2011-05-24 LAB — POCT GLYCOSYLATED HEMOGLOBIN (HGB A1C): Hemoglobin A1C: 6.5

## 2011-05-24 MED ORDER — TRIAMCINOLONE ACETONIDE 0.5 % EX OINT: TOPICAL_OINTMENT | Freq: Two times a day (BID) | CUTANEOUS | Status: DC

## 2011-05-24 MED ORDER — METFORMIN HCL 1000 MG PO TABS: 1000.0000 mg | ORAL_TABLET | Freq: Two times a day (BID) | ORAL | Status: DC

## 2011-05-24 MED ORDER — HYDROCHLOROTHIAZIDE 25 MG PO TABS: 25.0000 mg | ORAL_TABLET | Freq: Every day | ORAL | Status: DC

## 2011-05-24 NOTE — Assessment & Plan Note (Signed)
New with fall hx worry about a fracture.  Check xray

## 2011-05-24 NOTE — Progress Notes (Signed)
  Subjective:    Patient ID: Mackenzie [name], female    DOB: 1928-06-05, 77 y.o.   MRN: [id number]  HPI  DIABETES Disease Monitoring: Blood Sugar ranges-not checking Polyuria/phagia/dipsia- no      Visual problems- no Medications: Compliance- has medications with her.  Helper says she takes every day Hypoglycemic symptoms- no  Finger left middle finger tender at the medial edge has been for weeks,  Has white build up underneath.  No discharge or fever or redness.  left Wrist Tender on and off with intermittent soft tissue swelling.  Unsure if she fell on it but does fall often  Scalp psoriasis Not responding to triamcinolone 0.1% cream Only has on scalp  HYPERTENSION Disease Monitoring Home BP Monitoring not doing Chest pain- no     Dyspnea-  no  Medications Compliance: taking as prescribed. Lightheadedness-  no  Edema-  no   ROS - See HPI  PMH Lab Review   Potassium  Date Value Range Status  09/15/2010 4.2  3.5-5.3 (meq/L) Final     Sodium  Date Value Range Status  09/15/2010 142  135-145 (meq/L) Final      Review of Symptoms - see HPI  PMH - Smoking status noted.    SH - her husband died one week ago.  She has a younger friend who is helping take care of her checking daily and giving her medications.  Her dtr and son have plans to move her to an assited living situation     Review of Systems     Objective:   Physical Exam  Alert no acute distress Lungs:  Normal respiratory effort, chest expands symmetrically. Lungs are clear to auscultation, no crackles or wheezes. Heart - Regular rate and rhythm.  No murmurs, gallops or rubs.    Feet - dry sores left index finger - onchymycosis like buildup under nail.  No redness or discharge Left wrist - obvious soft tissue swelling lateral dorsal area mildly tender, no fluctuance moderate pain with range of motion no redness Scalp - lage 15 cm patch of psoriasis     Assessment & Plan:

## 2011-05-24 NOTE — Assessment & Plan Note (Signed)
Well controlled. Continue current medications  

## 2011-05-24 NOTE — Patient Instructions (Signed)
For the finger - soak 4 x a day for 10 minutes in soap salt water solution an peel back the edge  We wil get an xray of your wrist. I willcall you if any problems.  Use Tylenol arthritis three times daily as needed  Use the new cream for your scalp twice daily.  Use an OTC tar shampoo   I will call you if your tests are not good.  Otherwise I will send you a letter.  If you do not hear from me with in 2 weeks please call our office.

## 2011-05-24 NOTE — Assessment & Plan Note (Signed)
Well controlled on metformin.  Will check labs and continue

## 2011-05-24 NOTE — Assessment & Plan Note (Signed)
Worsened increase topical steroid

## 2011-05-30 ENCOUNTER — Ambulatory Visit
Admission: RE | Admit: 2011-05-30 | Discharge: 2011-05-30 | Disposition: A | Discharge status: Home / Self Care | Payer: Medicare Other | Source: Ambulatory Visit | Attending: Family Medicine | Admitting: Family Medicine

## 2011-05-30 DIAGNOSIS — M25539 Pain in unspecified wrist: Secondary | ICD-10-CM

## 2011-06-02 ENCOUNTER — Encounter: Payer: Self-pay | Admitting: Family Medicine

## 2011-07-12 ENCOUNTER — Telehealth: Payer: Self-pay | Admitting: Family Medicine

## 2011-07-12 NOTE — Telephone Encounter (Signed)
Niece wants to talk to Dr. Deirdre Priest about her Aunt going to a nursing home.

## 2011-07-13 ENCOUNTER — Telehealth: Payer: Self-pay | Admitting: Family Medicine

## 2011-07-13 NOTE — Telephone Encounter (Signed)
Would like to speak with provider or provider's nurse regarding her aunt.  Would not give reason

## 2011-07-13 NOTE — Telephone Encounter (Signed)
Pt has many questions that I am unable to answer about the level of care pt would need in a nursing home.  States that she has power of attorney.    Needs Dr. Deirdre Priest to fill out an FL2 form, she will come to pick it up.  Will forward to MD. Milas Gain, Maryjo Rochester

## 2011-07-13 NOTE — Telephone Encounter (Signed)
Spoke with Mackenzie [name] Let her know will need an office visit to fill out paperwork She agrees

## 2011-07-17 ENCOUNTER — Other Ambulatory Visit: Payer: Self-pay | Admitting: Family Medicine

## 2011-07-17 NOTE — Telephone Encounter (Signed)
Refill request

## 2011-07-24 NOTE — Telephone Encounter (Signed)
They were informed they need an office visit  Thanks

## 2011-07-24 NOTE — Telephone Encounter (Signed)
Has this been addressed?

## 2011-07-30 ENCOUNTER — Inpatient Hospital Stay: Payer: Self-pay | Admitting: Unknown Physician Specialty

## 2011-07-30 LAB — BASIC METABOLIC PANEL
Calcium, Total: 9.4 mg/dL (ref 8.5–10.1)
Co2: 29 mmol/L (ref 21–32)
Osmolality: 286 (ref 275–301)
Potassium: 4.9 mmol/L (ref 3.5–5.1)
Sodium: 139 mmol/L (ref 136–145)

## 2011-07-30 LAB — IRON AND TIBC
Iron Saturation: 11 %
Iron: 45 ug/dL — ABNORMAL LOW (ref 50–170)

## 2011-07-30 LAB — CBC
HGB: 11.4 g/dL — ABNORMAL LOW (ref 12.0–16.0)
MCH: 23.5 pg — ABNORMAL LOW (ref 26.0–34.0)
WBC: 7.3 10*3/uL (ref 3.6–11.0)

## 2011-07-30 LAB — URINALYSIS, COMPLETE
Bilirubin,UR: NEGATIVE
Blood: NEGATIVE
Glucose,UR: NEGATIVE mg/dL (ref 0–75)
Nitrite: POSITIVE
Specific Gravity: 1.017 (ref 1.003–1.030)
WBC UR: 4 /HPF (ref 0–5)

## 2011-07-30 LAB — TROPONIN I: Troponin-I: <0.02 ng/mL

## 2011-07-30 LAB — PROTIME-INR
INR: 0.9
Prothrombin Time: 12.7 secs (ref 11.5–14.7)

## 2011-07-30 LAB — CK TOTAL AND CKMB (NOT AT ARMC): CK-MB: 1 ng/mL (ref 0.5–3.6)

## 2011-07-30 LAB — APTT: Activated PTT: 26.4 secs (ref 23.6–35.9)

## 2011-07-31 ENCOUNTER — Telehealth: Payer: Self-pay | Admitting: Family Medicine

## 2011-07-31 LAB — BASIC METABOLIC PANEL
BUN: 17 mg/dL (ref 7–18)
Chloride: 103 mmol/L (ref 98–107)
Co2: 26 mmol/L (ref 21–32)
Creatinine: 0.55 mg/dL — ABNORMAL LOW (ref 0.60–1.30)

## 2011-07-31 LAB — TROPONIN I: Troponin-I: <0.02 ng/mL

## 2011-07-31 NOTE — Telephone Encounter (Signed)
Calling to ask if Dr. Deirdre Priest would call daughter to discuss mother being placed in a skilled nursing facility.  Mom is currently at [facility] due to broken rt hip from recent fall.  Daughter do not want Mrs. Sabbagh to go back home due to her inability to care for herself and having Alzheimers.  Please call her asap.  Daughter will obtain an FL2 to have completed.  Want to discuss the issue with you first.

## 2011-07-31 NOTE — Telephone Encounter (Signed)
Discussed with daughter recommended she contact SW in the hospital where her mother is.  She agreed.

## 2011-08-01 LAB — HEMOGLOBIN: HGB: 8.3 g/dL — ABNORMAL LOW (ref 12.0–16.0)

## 2011-08-14 ENCOUNTER — Emergency Department (HOSPITAL_COMMUNITY): Payer: Medicare Other

## 2011-08-14 ENCOUNTER — Emergency Department (HOSPITAL_COMMUNITY)
Admission: EM | Admit: 2011-08-14 | Discharge: 2011-08-15 | Disposition: A | Discharge status: Home / Self Care | Payer: Medicare Other | Source: Home / Self Care | Attending: Emergency Medicine | Admitting: Emergency Medicine

## 2011-08-14 ENCOUNTER — Encounter (HOSPITAL_COMMUNITY): Payer: Self-pay | Admitting: Family Medicine

## 2011-08-14 DIAGNOSIS — M25552 Pain in left hip: Secondary | ICD-10-CM

## 2011-08-14 NOTE — ED Notes (Signed)
Per EMS: Pt from [facility]. Reports having right hip pain lasting for several hours starting earlier today. Denies any injury or trauma.  When EMS arrived to facility pt was sleeping and denies pain at that time. NAD noted.

## 2011-08-14 NOTE — Discharge Instructions (Signed)
Tonight in ED had x-rays of left hip and CT scans of both hips without evidence of new fracture just remote pubic ramus fracture. Return to emergency apartment as needed for new or worse symptoms.

## 2011-08-14 NOTE — ED Notes (Signed)
Patient transported to X-ray. Will get labs when pt returns  

## 2011-08-14 NOTE — ED Notes (Signed)
Pt. Reports falling 2 weeks ago.  Now reports both hip areas are hurting.  Rates pain as a 7.  Alert and orientedx4  Initially complained of pain in her right hip but now reports her left hip is hurting as well.

## 2011-08-15 ENCOUNTER — Emergency Department (HOSPITAL_COMMUNITY): Payer: Medicare Other

## 2011-08-15 ENCOUNTER — Encounter (HOSPITAL_COMMUNITY)
Admission: EM | Disposition: A | Discharge status: Skilled Nursing Facility | Payer: Self-pay | Source: Home / Self Care | Attending: Surgery

## 2011-08-15 ENCOUNTER — Encounter: Payer: Self-pay | Admitting: Internal Medicine

## 2011-08-15 ENCOUNTER — Inpatient Hospital Stay (HOSPITAL_COMMUNITY): Payer: Medicare Other | Admitting: Anesthesiology

## 2011-08-15 ENCOUNTER — Inpatient Hospital Stay (HOSPITAL_COMMUNITY)
Admission: EM | Admit: 2011-08-15 | Discharge: 2011-08-21 | DRG: 253 | Disposition: A | Discharge status: Skilled Nursing Facility | Payer: Medicare Other | Attending: Surgery | Admitting: Surgery

## 2011-08-15 ENCOUNTER — Encounter (HOSPITAL_COMMUNITY): Payer: Self-pay

## 2011-08-15 ENCOUNTER — Other Ambulatory Visit: Payer: Self-pay | Admitting: Surgery

## 2011-08-15 ENCOUNTER — Encounter (HOSPITAL_COMMUNITY): Payer: Self-pay | Admitting: Anesthesiology

## 2011-08-15 DIAGNOSIS — E785 Hyperlipidemia, unspecified: Secondary | ICD-10-CM

## 2011-08-15 DIAGNOSIS — D62 Acute posthemorrhagic anemia: Secondary | ICD-10-CM | POA: Diagnosis present

## 2011-08-15 DIAGNOSIS — I739 Peripheral vascular disease, unspecified: Secondary | ICD-10-CM

## 2011-08-15 DIAGNOSIS — K219 Gastro-esophageal reflux disease without esophagitis: Secondary | ICD-10-CM | POA: Diagnosis present

## 2011-08-15 DIAGNOSIS — M79604 Pain in right leg: Secondary | ICD-10-CM

## 2011-08-15 DIAGNOSIS — I2510 Atherosclerotic heart disease of native coronary artery without angina pectoris: Secondary | ICD-10-CM | POA: Diagnosis present

## 2011-08-15 DIAGNOSIS — M810 Age-related osteoporosis without current pathological fracture: Secondary | ICD-10-CM | POA: Diagnosis present

## 2011-08-15 DIAGNOSIS — R7989 Other specified abnormal findings of blood chemistry: Secondary | ICD-10-CM | POA: Diagnosis not present

## 2011-08-15 DIAGNOSIS — L408 Other psoriasis: Secondary | ICD-10-CM | POA: Diagnosis present

## 2011-08-15 DIAGNOSIS — Z794 Long term (current) use of insulin: Secondary | ICD-10-CM

## 2011-08-15 DIAGNOSIS — E876 Hypokalemia: Secondary | ICD-10-CM | POA: Diagnosis not present

## 2011-08-15 DIAGNOSIS — I70219 Atherosclerosis of native arteries of extremities with intermittent claudication, unspecified extremity: Secondary | ICD-10-CM

## 2011-08-15 DIAGNOSIS — H3530 Unspecified macular degeneration: Secondary | ICD-10-CM | POA: Diagnosis present

## 2011-08-15 DIAGNOSIS — I10 Essential (primary) hypertension: Secondary | ICD-10-CM

## 2011-08-15 DIAGNOSIS — I7090 Unspecified atherosclerosis: Secondary | ICD-10-CM

## 2011-08-15 DIAGNOSIS — I70299 Other atherosclerosis of native arteries of extremities, unspecified extremity: Principal | ICD-10-CM | POA: Diagnosis present

## 2011-08-15 DIAGNOSIS — Z23 Encounter for immunization: Secondary | ICD-10-CM

## 2011-08-15 DIAGNOSIS — M79609 Pain in unspecified limb: Secondary | ICD-10-CM

## 2011-08-15 DIAGNOSIS — Z79899 Other long term (current) drug therapy: Secondary | ICD-10-CM

## 2011-08-15 DIAGNOSIS — Z87891 Personal history of nicotine dependence: Secondary | ICD-10-CM

## 2011-08-15 DIAGNOSIS — Z96649 Presence of unspecified artificial hip joint: Secondary | ICD-10-CM

## 2011-08-15 DIAGNOSIS — I7092 Chronic total occlusion of artery of the extremities: Secondary | ICD-10-CM | POA: Diagnosis present

## 2011-08-15 DIAGNOSIS — F0390 Unspecified dementia without behavioral disturbance: Secondary | ICD-10-CM | POA: Diagnosis present

## 2011-08-15 DIAGNOSIS — F341 Dysthymic disorder: Secondary | ICD-10-CM | POA: Diagnosis present

## 2011-08-15 DIAGNOSIS — E119 Type 2 diabetes mellitus without complications: Secondary | ICD-10-CM | POA: Diagnosis present

## 2011-08-15 HISTORY — DX: Essential (primary) hypertension: I10

## 2011-08-15 HISTORY — DX: Atherosclerotic heart disease of native coronary artery without angina pectoris: I25.10

## 2011-08-15 HISTORY — PX: AXILLARY-FEMORAL BYPASS GRAFT: SHX894

## 2011-08-15 HISTORY — DX: Anemia, unspecified: D64.9

## 2011-08-15 HISTORY — PX: LOWER EXTREMITY ANGIOGRAM: SHX5508

## 2011-08-15 HISTORY — DX: Fracture of unspecified part of neck of unspecified femur, initial encounter for closed fracture: S72.009A

## 2011-08-15 LAB — BASIC METABOLIC PANEL
Chloride: 96 mEq/L (ref 96–112)
GFR calc Af Amer: >90 mL/min (ref >90)
GFR calc non Af Amer: >90 mL/min (ref >90)
Glucose, Bld: 166 mg/dL — ABNORMAL HIGH (ref 70–99)
Potassium: 3.9 mEq/L (ref 3.5–5.1)
Sodium: 138 mEq/L (ref 135–145)

## 2011-08-15 LAB — PROTIME-INR: INR: 0.98 (ref 0.00–1.49)

## 2011-08-15 LAB — CBC
HCT: 34.3 % — ABNORMAL LOW (ref 36.0–46.0)
Hemoglobin: 10.1 g/dL — ABNORMAL LOW (ref 12.0–15.0)
RBC: 4.64 MIL/uL (ref 3.87–5.11)
RDW: 20.6 % — ABNORMAL HIGH (ref 11.5–15.5)
WBC: 8.6 10*3/uL (ref 4.0–10.5)

## 2011-08-15 LAB — DIFFERENTIAL
Basophils Absolute: 0.1 10*3/uL (ref 0.0–0.1)
Eosinophils Relative: 1 % (ref 0–5)
Lymphocytes Relative: 21 % (ref 12–46)
Monocytes Relative: 12 % (ref 3–12)
Neutro Abs: 5.6 10*3/uL (ref 1.7–7.7)

## 2011-08-15 SURGERY — ANGIOGRAM, LOWER EXTREMITY
Anesthesia: LOCAL

## 2011-08-15 SURGERY — CREATION, BYPASS, ARTERIAL, AXILLARY TO BILATERAL FEMORAL, USING GRAFT
Anesthesia: General | Laterality: Right | Wound class: Clean

## 2011-08-15 MED ORDER — ONDANSETRON HCL 4 MG/2ML IJ SOLN: 4.0000 mg | Freq: Four times a day (QID) | INTRAMUSCULAR | Status: DC | PRN

## 2011-08-15 MED ORDER — DONEPEZIL HCL 5 MG PO TABS
5.0000 mg | ORAL_TABLET | Freq: Every day | ORAL | Status: DC
  Administered 2011-08-16 – 2011-08-20 (×5): 5 mg via ORAL
  Filled 2011-08-15 (×7): qty 1

## 2011-08-15 MED ORDER — OXYCODONE HCL 5 MG PO CAPS: 5.0000 mg | ORAL_CAPSULE | ORAL | Status: DC | PRN

## 2011-08-15 MED ORDER — MIDAZOLAM HCL 2 MG/2ML IJ SOLN
INTRAMUSCULAR | Status: AC
  Filled 2011-08-15: qty 2

## 2011-08-15 MED ORDER — METOPROLOL TARTRATE 50 MG PO TABS
50.0000 mg | ORAL_TABLET | Freq: Two times a day (BID) | ORAL | Status: DC
  Administered 2011-08-16 – 2011-08-21 (×11): 50 mg via ORAL
  Filled 2011-08-15 (×13): qty 1

## 2011-08-15 MED ORDER — ENOXAPARIN SODIUM 30 MG/0.3ML ~~LOC~~ SOLN
30.0000 mg | Freq: Two times a day (BID) | SUBCUTANEOUS | Status: DC
  Filled 2011-08-15 (×4): qty 0.3

## 2011-08-15 MED ORDER — DEXTROSE 5 % IV SOLN
1.5000 g | INTRAVENOUS | Status: AC
  Administered 2011-08-15: 1.5 g via INTRAVENOUS
  Filled 2011-08-15: qty 1.5

## 2011-08-15 MED ORDER — HYDROCHLOROTHIAZIDE 25 MG PO TABS
25.0000 mg | ORAL_TABLET | Freq: Every day | ORAL | Status: DC
  Administered 2011-08-16 – 2011-08-21 (×6): 25 mg via ORAL
  Filled 2011-08-15 (×6): qty 1

## 2011-08-15 MED ORDER — ACETAMINOPHEN 325 MG PO TABS: 650.0000 mg | ORAL_TABLET | ORAL | Status: DC | PRN

## 2011-08-15 MED ORDER — FENTANYL CITRATE 0.05 MG/ML IJ SOLN
INTRAMUSCULAR | Status: DC | PRN
  Administered 2011-08-15: 50 ug via INTRAVENOUS
  Administered 2011-08-15: 100 ug via INTRAVENOUS
  Administered 2011-08-15: 150 ug via INTRAVENOUS
  Administered 2011-08-16: 50 ug via INTRAVENOUS

## 2011-08-15 MED ORDER — HEPARIN (PORCINE) IN NACL 2-0.9 UNIT/ML-% IJ SOLN
INTRAMUSCULAR | Status: AC
  Filled 2011-08-15: qty 2000

## 2011-08-15 MED ORDER — ALUM & MAG HYDROXIDE-SIMETH 200-200-20 MG/5ML PO SUSP: 30.0000 mL | Freq: Four times a day (QID) | ORAL | Status: DC | PRN

## 2011-08-15 MED ORDER — SODIUM CHLORIDE 0.9 % IR SOLN
Status: DC | PRN
  Administered 2011-08-15

## 2011-08-15 MED ORDER — IOHEXOL 350 MG/ML SOLN
90.0000 mL | Freq: Once | INTRAVENOUS | Status: AC | PRN
  Administered 2011-08-15: 90 mL via INTRAVENOUS

## 2011-08-15 MED ORDER — INSULIN ASPART 100 UNIT/ML ~~LOC~~ SOLN
2.0000 [IU] | Freq: Three times a day (TID) | SUBCUTANEOUS | Status: DC
  Filled 2011-08-15: qty 3

## 2011-08-15 MED ORDER — PROPOFOL 10 MG/ML IV BOLUS
INTRAVENOUS | Status: DC | PRN
  Administered 2011-08-15: 100 mg via INTRAVENOUS

## 2011-08-15 MED ORDER — SUCCINYLCHOLINE CHLORIDE 20 MG/ML IJ SOLN
INTRAMUSCULAR | Status: DC | PRN
  Administered 2011-08-15: 100 mg via INTRAVENOUS

## 2011-08-15 MED ORDER — FENTANYL CITRATE 0.05 MG/ML IJ SOLN: 25.0000 ug | Freq: Once | INTRAMUSCULAR | Status: DC

## 2011-08-15 MED ORDER — BISACODYL 10 MG RE SUPP: 10.0000 mg | RECTAL | Status: DC | PRN

## 2011-08-15 MED ORDER — PHENYLEPHRINE HCL 10 MG/ML IJ SOLN
10.0000 mg | INTRAVENOUS | Status: DC | PRN
  Administered 2011-08-15: 40 ug/min via INTRAVENOUS

## 2011-08-15 MED ORDER — MAGNESIUM HYDROXIDE 400 MG/5ML PO SUSP: 30.0000 mL | Freq: Every evening | ORAL | Status: DC | PRN

## 2011-08-15 MED ORDER — HEMOSTATIC AGENTS (NO CHARGE) OPTIME
TOPICAL | Status: DC | PRN
  Administered 2011-08-15: 1 via TOPICAL

## 2011-08-15 MED ORDER — HEPARIN SODIUM (PORCINE) 1000 UNIT/ML IJ SOLN
INTRAMUSCULAR | Status: DC | PRN
  Administered 2011-08-15: 3000 [IU] via INTRAVENOUS
  Administered 2011-08-15: 6000 [IU] via INTRAVENOUS
  Administered 2011-08-15: 5000 [IU] via INTRAVENOUS

## 2011-08-15 MED ORDER — NITROGLYCERIN 0.3 MG/HR TD PT24
0.3000 mg | MEDICATED_PATCH | Freq: Every day | TRANSDERMAL | Status: DC
  Administered 2011-08-16 – 2011-08-21 (×7): 0.3 mg via TRANSDERMAL
  Filled 2011-08-15 (×7): qty 1

## 2011-08-15 MED ORDER — FENTANYL CITRATE 0.05 MG/ML IJ SOLN
12.5000 ug | Freq: Once | INTRAMUSCULAR | Status: AC
  Administered 2011-08-15: 12.5 ug via INTRAVENOUS
  Filled 2011-08-15: qty 2

## 2011-08-15 MED ORDER — FENTANYL CITRATE 0.05 MG/ML IJ SOLN
INTRAMUSCULAR | Status: AC
  Filled 2011-08-15: qty 2

## 2011-08-15 MED ORDER — LIDOCAINE HCL (PF) 1 % IJ SOLN
INTRAMUSCULAR | Status: AC
  Filled 2011-08-15: qty 30

## 2011-08-15 MED ORDER — DEXTROSE 5 % IV SOLN
INTRAVENOUS | Status: AC
  Filled 2011-08-15: qty 50

## 2011-08-15 MED ORDER — SIMVASTATIN 40 MG PO TABS
40.0000 mg | ORAL_TABLET | Freq: Every day | ORAL | Status: DC
  Administered 2011-08-16 – 2011-08-20 (×5): 40 mg via ORAL
  Filled 2011-08-15 (×7): qty 1

## 2011-08-15 MED ORDER — METFORMIN HCL 500 MG PO TABS
1000.0000 mg | ORAL_TABLET | Freq: Two times a day (BID) | ORAL | Status: DC
  Filled 2011-08-15 (×3): qty 2

## 2011-08-15 MED ORDER — OXYCODONE HCL 5 MG PO TABS
5.0000 mg | ORAL_TABLET | ORAL | Status: DC | PRN
  Administered 2011-08-15 – 2011-08-18 (×2): 5 mg via ORAL
  Filled 2011-08-15: qty 1

## 2011-08-15 MED ORDER — OXYCODONE HCL 5 MG PO TABS
ORAL_TABLET | ORAL | Status: AC
  Filled 2011-08-15: qty 1

## 2011-08-15 MED ORDER — SERTRALINE HCL 50 MG PO TABS
50.0000 mg | ORAL_TABLET | Freq: Every day | ORAL | Status: DC
  Administered 2011-08-16 – 2011-08-20 (×5): 50 mg via ORAL
  Filled 2011-08-15 (×7): qty 1

## 2011-08-15 MED ORDER — CEFUROXIME SODIUM 1.5 G IJ SOLR
INTRAMUSCULAR | Status: AC
  Filled 2011-08-15: qty 1.5

## 2011-08-15 MED ORDER — 0.9 % SODIUM CHLORIDE (POUR BTL) OPTIME
TOPICAL | Status: DC | PRN
  Administered 2011-08-15: 1000 mL

## 2011-08-15 MED ORDER — SODIUM CHLORIDE 0.9 % IV SOLN
INTRAVENOUS | Status: DC
  Administered 2011-08-15 (×3): via INTRAVENOUS

## 2011-08-15 SURGICAL SUPPLY — 71 items
BALLN CODA OCL 2-9.0-35-120-3 (BALLOONS) ×2
BALLOON COD OCL 2-9.0-35-120-3 (BALLOONS) ×1 IMPLANT
BLADE SURG 11 STRL SS (BLADE) ×2 IMPLANT
CANISTER SUCTION 2500CC (MISCELLANEOUS) ×2 IMPLANT
CATH EMB 3FR 80CM (CATHETERS) ×2 IMPLANT
CATH EMB 4FR 80CM (CATHETERS) ×2 IMPLANT
CATH EMB 6FR 80CM (CATHETERS) ×2 IMPLANT
CLIP TI MEDIUM 24 (CLIP) ×2 IMPLANT
CLIP TI WIDE RED SMALL 24 (CLIP) ×2 IMPLANT
CLOTH BEACON ORANGE TIMEOUT ST (SAFETY) ×2 IMPLANT
COVER SURGICAL LIGHT HANDLE (MISCELLANEOUS) ×4 IMPLANT
DERMABOND ADHESIVE PROPEN (GAUZE/BANDAGES/DRESSINGS) ×1
DERMABOND ADVANCED (GAUZE/BANDAGES/DRESSINGS) ×1
DERMABOND ADVANCED .7 DNX12 (GAUZE/BANDAGES/DRESSINGS) ×1 IMPLANT
DERMABOND ADVANCED .7 DNX6 (GAUZE/BANDAGES/DRESSINGS) ×1 IMPLANT
DRAIN SNY 10X20 3/4 PERF (WOUND CARE) IMPLANT
DRAPE INCISE IOBAN 66X45 STRL (DRAPES) ×2 IMPLANT
DRAPE PROXIMA HALF (DRAPES) ×4 IMPLANT
DRAPE WARM FLUID 44X44 (DRAPE) ×2 IMPLANT
DRAPE X-RAY CASS 24X20 (DRAPES) IMPLANT
DRSG COVADERM 4X10 (GAUZE/BANDAGES/DRESSINGS) IMPLANT
DRSG COVADERM 4X6 (GAUZE/BANDAGES/DRESSINGS) ×4 IMPLANT
DRSG COVADERM 4X8 (GAUZE/BANDAGES/DRESSINGS) IMPLANT
DRSG MEPITEL 4X7.2 (GAUZE/BANDAGES/DRESSINGS) ×4 IMPLANT
DRSG VAC ATS SM SENSATRAC (GAUZE/BANDAGES/DRESSINGS) ×4 IMPLANT
ELECT REM PT RETURN 9FT ADLT (ELECTROSURGICAL) ×2
ELECTRODE REM PT RTRN 9FT ADLT (ELECTROSURGICAL) ×1 IMPLANT
EVACUATOR SILICONE 100CC (DRAIN) IMPLANT
GAUZE SPONGE 4X4 16PLY XRAY LF (GAUZE/BANDAGES/DRESSINGS) IMPLANT
GLOVE BIO SURGEON STRL SZ 6.5 (GLOVE) ×14 IMPLANT
GLOVE BIO SURGEON STRL SZ8.5 (GLOVE) ×2 IMPLANT
GLOVE BIOGEL PI IND STRL 7.0 (GLOVE) ×3 IMPLANT
GLOVE BIOGEL PI IND STRL 7.5 (GLOVE) ×1 IMPLANT
GLOVE BIOGEL PI INDICATOR 7.0 (GLOVE) ×3
GLOVE BIOGEL PI INDICATOR 7.5 (GLOVE) ×1
GLOVE SURG SS PI 7.5 STRL IVOR (GLOVE) ×2 IMPLANT
GOWN PREVENTION PLUS XXLARGE (GOWN DISPOSABLE) ×4 IMPLANT
GOWN STRL NON-REIN LRG LVL3 (GOWN DISPOSABLE) ×6 IMPLANT
GRAFT PROPATEN W/RING 8X80X70 (Vascular Products) ×2 IMPLANT
HEMOSTAT SNOW SURGICEL 2X4 (HEMOSTASIS) ×2 IMPLANT
HEMOSTAT SURGICEL 2X14 (HEMOSTASIS) IMPLANT
INSERT FOGARTY SM (MISCELLANEOUS) IMPLANT
KIT BASIN OR (CUSTOM PROCEDURE TRAY) ×2 IMPLANT
KIT ROOM TURNOVER OR (KITS) ×2 IMPLANT
NS IRRIG 1000ML POUR BTL (IV SOLUTION) ×4 IMPLANT
PACK PERIPHERAL VASCULAR (CUSTOM PROCEDURE TRAY) ×2 IMPLANT
PAD ARMBOARD 7.5X6 YLW CONV (MISCELLANEOUS) ×4 IMPLANT
PAD NEG PRESSURE SENSATRAC (MISCELLANEOUS) ×2 IMPLANT
SET COLLECT BLD 21X3/4 12 (NEEDLE) IMPLANT
SPONGE INTESTINAL PEANUT (DISPOSABLE) ×2 IMPLANT
STAPLER VISISTAT 35W (STAPLE) ×2 IMPLANT
STOPCOCK 4 WAY LG BORE MALE ST (IV SETS) IMPLANT
SUT GORETEX 5 0 TT13 24 (SUTURE) IMPLANT
SUT GORETEX 6.0 TH-9 30 IN (SUTURE) ×4 IMPLANT
SUT GORETEX 6.0 TT9 (SUTURE) IMPLANT
SUT GORETEX CV-6TTC-13 36IN (SUTURE) ×2 IMPLANT
SUT PROLENE 5 0 C 1 36 (SUTURE) ×2 IMPLANT
SUT PROLENE 6 0 BV (SUTURE) ×4 IMPLANT
SUT PROLENE 6 0 CC (SUTURE) ×2 IMPLANT
SUT SILK 2 0 SH (SUTURE) ×2 IMPLANT
SUT VIC AB 2-0 CT1 27 (SUTURE) ×2
SUT VIC AB 2-0 CT1 TAPERPNT 27 (SUTURE) ×2 IMPLANT
SUT VIC AB 3-0 SH 27 (SUTURE) ×3
SUT VIC AB 3-0 SH 27X BRD (SUTURE) ×3 IMPLANT
SUT VICRYL 4-0 PS2 18IN ABS (SUTURE) ×4 IMPLANT
TAPE UMBILICAL COTTON 1/8X30 (MISCELLANEOUS) ×2 IMPLANT
TOWEL OR 17X24 6PK STRL BLUE (TOWEL DISPOSABLE) ×4 IMPLANT
TOWEL OR 17X26 10 PK STRL BLUE (TOWEL DISPOSABLE) ×6 IMPLANT
TRAY FOLEY CATH 14FRSI W/METER (CATHETERS) IMPLANT
TUBING EXTENTION W/L.L. (IV SETS) IMPLANT
WATER STERILE IRR 1000ML POUR (IV SOLUTION) ×2 IMPLANT

## 2011-08-15 NOTE — ED Notes (Signed)
Consent obtained at 1505

## 2011-08-15 NOTE — Op Note (Signed)
[facility]  Patient name: Mackenzie [name] MRN: [id number] DOB: 1927/11/03 Sex: female  08/15/2011 Pre-operative Diagnosis: Ischemic right leg Post-operative diagnosis:  Same Surgeon:  Jorge Ny Procedure Performed:  1.  ultrasound access left femoral artery  2.  attempted access right femoral artery  3.  catheter in left external iliac  4.  left leg evaluation   Indications:  The patient presented to the emergency department with a one-week history of right foot pain. She does not have any motor function or sensory function in her right leg. She has no Doppler signals in the right leg. She is brought for arteriogram to further delineate her anatomy to help with interventional decisions.  Procedure:  The patient was identified in the holding area and taken to room 8.  The patient was then placed supine on the table and prepped and draped in the usual sterile fashion.  A time out was called.  Ultrasound was used to evaluate the right femoral artery. It was heavily calcified it was not pulsatile under ultrasound guidance a micropuncture needle was used to access the right femoral artery. I was able to get the needle into the artery however I could not get the wire to advance. There was a trickle of blood flow from the needle. I ultimately gave up on access on the right side and switched to the left. By ultrasound the left femoral artery was again heavily calcified there was not a significant pulsatility to the artery. The artery was then cannulated under ultrasound guidance with a micropuncture needle. An 018 wire was advanced into the iliac system without resistance and a micropuncture sheath was placed. A Benson wire was then advanced into the iliac where it met resistance and a 5 French sheath was placed. I tried to use a KMP catheter to access the aorta however I kept being unable to advance the wire. I performed a angiogram with the catheter in the external  and common iliac artery. This revealed proximal common iliac stenosis on the left. The extrailiac artery was patent as was the internal iliac artery. The patient at this time is having significant discomfort and because of our poor quality of images as well as difficulty with access I elected to abort the procedure and get a CT angiogram to better delineate her anatomy   Impression:  #1  occluded left iliac artery.  #2  the right iliac system could not be evaluated.  #3  the patient will get a CT and 2 g to better delineate her anatomy as her disease rivets adequate evaluation with groin access  V. Durene Cal, M.D. [facility] Office: [phone] Pager:  [phone]

## 2011-08-15 NOTE — Interval H&P Note (Signed)
History and Physical Interval Note:  08/15/2011 7:31 PM  Mackenzie [name]  has presented today for surgery, with the diagnosis of Cold leg  The various methods of treatment have been discussed with the patient and family. After consideration of risks, benefits and other options for treatment, the patient has consented to  Procedure(s) (LRB): BYPASS GRAFT AXILLA-BIFEMORAL (Bilateral) as a surgical intervention .  The patients' history has been reviewed, patient examined, no change in status, stable for surgery.  I have reviewed the patients' chart and labs.  Questions were answered to the patient's satisfaction.     Mackenzie [name] IV, Lala Lund  I had an extensive conversation with the daughter and the power of attourny,  [employer].  After discussing the possibilities of revascularization vs observation, they have expressed desire to proceed with bypass for limb salvage.  I feel that observation will result in amputation, which they want to avoid.  They understand the risks of the operation which include death, cardiac and pulmonary complications, wound healing issues, renal issues and the possibility that she may still loose her leg.  All questions were answered.  We will proceed with surgery.

## 2011-08-15 NOTE — ED Notes (Signed)
I gave report to Clydie Braun at [facility], [place] notified

## 2011-08-15 NOTE — ED Notes (Signed)
Vascular at bedside

## 2011-08-15 NOTE — Consult Note (Signed)
[facility] ADMIT/CONSULT NOTE 08/15/2011 [id number]  CC:This is an 78 Y.o lady with a 1 week history of sever right leg pain and lose of use. Referring Physician:ER Redge Gainer  History of Present Illness: This is an 78 Y.o lady with a 1 week history of sever right leg pain and has been unable to walk.  She has no sensation to touch and no active range of motion in the right foot and ankle foot or ankle.     Past Medical History  Diagnosis Date  . Delirium 12/10/08    hospitalized  . Pelvic fracture 07/01/09    hospitalized    Past Surgical History  Procedure Date  . Cholecystectomy   . Cataract extraction      ROS: [x]  Positive  [ ]  Denies    General: [ ]  Weight loss, [ ]  Fever, [ ]  chills Neurologic: [ ]  Dizziness, [ ]  Blackouts, [ ]  Seizure [ ]  Stroke, [ ]  "Mini stroke", [ ]  Slurred speech, [ ]  Temporary blindness; [ ]  weakness in arms or legs, [ ]  Hoarseness Cardiac: [ ]  Chest pain/pressure, [ ]  Shortness of breath at rest [ ]  Shortness of breath with exertion, [ ]  Atrial fibrillation or irregular heartbeat Vascular: [x ] Pain in legs with walking, [x ] Pain in legs at rest, [ ]  Pain in legs at night,  [ ]  Non-healing ulcer, [ ]  Blood clot in vein/DVT,   Pulmonary: [ ]  Home oxygen, [ ]  Productive cough, [ ]  Coughing up blood, [ ]  Asthma,  [ ]  Wheezing Musculoskeletal:  [x ] Arthritis, [ ]  Low back pain, x[ ]  Joint pain Hematologic: [ ]  Easy Bruising, [ ]  Anemia; [ ]  Hepatitis Gastrointestinal: [ ]  Blood in stool, [ ]  Gastroesophageal Reflux/heartburn, [ ]  Trouble swallowing Urinary: [ ]  chronic Kidney disease, [ ]  on HD - [ ]  MWF or [ ]  TTHS, [ ]  Burning with urination, [ ]  Difficulty urinating Skin: [ ]  Rashes, [ ]  Wounds Psychological: [ ]  Anxiety, [ ]  Depression  Social History History  Substance Use Topics  . Smoking status: Former Games developer  . Smokeless tobacco: Never Used  . Alcohol Use: No  She was married for 61 years and  became widowed this past November. 2012. She lives alone in her current home.  Family History Family History  Problem Relation Age of Onset  . Heart failure Father     died age 62  . Stroke Mother     died age 60    No Known Allergies  Current Facility-Administered Medications  Medication Dose Route Frequency Provider Last Rate Last Dose  . fentaNYL (SUBLIMAZE) injection 12.5 mcg  12.5 mcg Intravenous Once Grant Fontana, [place]      . DISCONTD: fentaNYL (SUBLIMAZE) injection 25 mcg  25 mcg Intravenous Once Grant Fontana, [place]       Current Outpatient Prescriptions  Medication Sig Dispense Refill  . alum & mag hydroxide-simeth (MYLANTA DOUBLE-STRENGTH) 400-400-40 MG/5ML suspension Take 30 mLs by mouth every 6 (six) hours as needed. dyspepsia      . bisacodyl (DULCOLAX) 10 MG suppository Place 10 mg rectally as needed. For constipation      . donepezil (ARICEPT) 5 MG tablet Take 5 mg by mouth at bedtime.      . enoxaparin (LOVENOX) 30 MG/0.3ML SOLN Inject 30 mg into the skin every 12 (twelve) hours. For 24 days      . hydrochlorothiazide (HYDRODIURIL) 25 MG tablet Take 1 tablet (  25 mg total) by mouth daily.  30 tablet  6  . insulin aspart (NOVOLOG) 100 UNIT/ML injection Inject 2-8 Units into the skin 4 (four) times daily - after meals and at bedtime.      . magnesium hydroxide (MILK OF MAGNESIA) 400 MG/5ML suspension Take 30 mLs by mouth at bedtime as needed.      . metFORMIN (GLUCOPHAGE) 1000 MG tablet Take 1 tablet (1,000 mg total) by mouth 2 (two) times daily with a meal.  60 tablet  6  . metoprolol (LOPRESSOR) 50 MG tablet Take 1 tablet (50 mg total) by mouth 2 (two) times daily.  60 tablet  5  . nitroGLYCERIN (NITRODUR - DOSED IN MG/24 HR) 0.3 mg/hr Place 1 patch onto the skin daily.      Marland Kitchen OVER THE COUNTER MEDICATION Take 1 capsule by mouth 2 (two) times daily. Vitamin B complex with iron intrinsic factor      . sertraline (ZOLOFT) 50 MG tablet Take 50 mg by mouth at bedtime.       . simvastatin (ZOCOR) 40 MG tablet Take 1 tablet (40 mg total) by mouth at bedtime.  30 tablet  5  . oxycodone (OXY-IR) 5 MG capsule Take 5 mg by mouth every 4 (four) hours as needed. For severe pain         Imaging: Dg Hip Complete Right  08/14/2011  *RADIOLOGY REPORT*  Clinical Data: Right hip pain after fall 1 week ago.  RIGHT HIP - COMPLETE 2+ VIEW  Comparison: 07/01/2009  Findings: Since the previous study, there is been interval placement of a left hip arthroplasty with bipolar femoral component.  Components appear well seated.  Skin clips consistent with recent surgery.  There are old healed fracture deformities of the right superior and inferior pubic rami.  Degenerative changes in the right hip with joint space narrowing, sclerosis, and acetabular hypertrophic changes.  Suggestion of vague cortical irregularities along the superior and inferior aspect of the acetabulum which may be related to the old fractures but nondisplaced acute fractures not excluded.  No evidence of fracture or dislocation of the right femur.  Old fracture deformity of the right iliac wing.  Vascular calcifications.  IMPRESSION: Postoperative changes in the left hip.  Old healed fracture deformities of the right iliac bone, superior, and inferior pubic rami.  Focal cortical step off demonstrated superior and inferior to the acetabulum may be related to the previous fractures but acute fracture without displacement is not excluded.  The right proximal femur appears intact.  Original Report Authenticated By: Marlon Pel, M.D.   Ct Pelvis Wo Contrast  08/14/2011  *RADIOLOGY REPORT*  Clinical Data:  Fall 2 weeks ago.  Bilateral hip pain.  CT PELVIS WITHOUT CONTRAST  Technique:  Multidetector CT imaging of the pelvis was performed following the standard protocol without intravenous contrast.  Comparison:   CT abdomen and pelvis 07/01/2009.  Findings:  The right hip is located.  No acute fracture is present. There is a  remote fracture of the inferior pubic ramus on the right.  The patient is status post left hip hemiarthroplasty.  The femoral prosthesis is well-seated.  There is no acute fracture.  Mild osteopenia is evident.  The soft tissue windows demonstrate atherosclerotic calcifications at the aortic bifurcation and iliac arteries without aneurysm.  The rectosigmoid colon is within normal limits.  The uterus and adnexa are within normal limits for age. The visualized bowel is otherwise unremarkable.  IMPRESSION:  1.  No  acute fracture. 2.  Remote right inferior pubic ramus fracture. 3.  Atherosclerosis. 4.  Left hip hemiarthroplasty.  Original Report Authenticated By: Jamesetta Orleans. MATTERN, M.D.    Significant Diagnostic Studies: CBC Lab Results  Component Value Date   WBC 8.6 08/15/2011   HGB 10.1* 08/15/2011   HCT 34.3* 08/15/2011   MCV 73.9* 08/15/2011   PLT 621* 08/15/2011    BMET    Component Value Date/Time   NA 139 05/24/2011 1046   K 4.2 05/24/2011 1046   CL 101 05/24/2011 1046   CO2 30 05/24/2011 1046   GLUCOSE 114* 05/24/2011 1046   BUN 12 05/24/2011 1046   CREATININE 0.58 05/24/2011 1046   CREATININE 0.74 09/15/2010 1755   CALCIUM 10.0 05/24/2011 1046   GFRNONAA 37* 07/15/2009 0621   GFRAA  Value: 45        The eGFR has been calculated using the MDRD equation. This calculation has not been validated in all clinical situations. eGFR's persistently <60 mL/min signify possible Chronic Kidney Disease.* 07/15/2009 1610    COAG Lab Results  Component Value Date   INR 1.03 07/01/2009   No results found for this basename: PTT     Physical Examination  Patient Vitals for the past 24 hrs:  BP Temp Temp src Pulse Resp SpO2  08/15/11 1352 118/62 mmHg 97.8 F (36.6 C) Oral 104  20  96 %  08/15/11 1343 - - - - - 98 %   Pulse Readings from Last 3 Encounters:  08/15/11 104  08/15/11 102  05/24/11 70    General:  WDWN in NAD Gait: non ambulatory HENT: WNL Eyes: Pupils  equal Pulmonary: normal non-labored breathing , without Rales, rhonchi,  wheezing Cardiac: RRR, without  Murmurs, rubs or gallops; No carotid bruits Abdomen: soft, NT, no masses, positive bowel sounds Skin: no rashes, ulcers noted Vascular Exam/Pulses: right lower extremity absent DP/PT and Popliteal pulse, faint femoral palp pulse. Absent AROM right foot and ankle.  Absent sensation to touch distal 1/2 right lower leg, with skin modeling appearance.  Left palp femoral pulse and PT present via doppler. Left lower leg grossly motor and sensation intact. Extremities without ischemic changes, no Gangrene , no cellulitis; no open wounds;  Musculoskeletal: no muscle wasting or atrophy  Neurologic: A&O X 3; Appropriate Affect ;  SENSATION: normal; MOTOR FUNCTION:  moving all extremities equally.  Speech is fluent/normal  Non-Invasive Vascular Imaging:  ASSESSMENT: Chronic PAD with 1 week history of right LE ischemia PLAN:Angiogram by Dr. Myra Gianotti

## 2011-08-15 NOTE — H&P (Signed)
[facility]       Consult Note  Patient name: Mackenzie [name] MRN: [id number] DOB: 11/05/1927 Sex: female  Consulting Physician:  Emergency department  Reason for Consult:  Chief Complaint  Patient presents with  . Leg Pain    Right    HISTORY OF PRESENT ILLNESS: This is a 78 year old female I am seeing as a consult in the emergency department for concerns over an ischemic right leg. The patient was brought to the emergency department earlier today with complaints of right leg pain. She was in the emergency department yesterday with right hip pain. There is no mention of issues in her right leg. The patient states that she is been having problems with her right foot for over a week. She does walk however she has not walked in over a week because of her leg. She is unable to move her right foot. She is unable to feel her right foot. It is very painful. There are no relieving factors. The pain is constant.  The patient is in a nursing facility. She suffers from delivery him. Her atherosclerotic risk factors include a history of smoking. She is medically managed for her hypertension and diabetes.  Past Medical History  Diagnosis Date  . Delirium 12/10/08    hospitalized  . Pelvic fracture 07/01/09    hospitalized  . Coronary artery disease   . Femoral neck fracture   . Anemia   . Hypertension   . Diabetes mellitus     Past Surgical History  Procedure Date  . Cholecystectomy   . Cataract extraction     History   Social History  . Marital Status: Married    Spouse Name: Leonette Most    Number of Children: N/A  . Years of Education: 12   Occupational History  . Not on file.   Social History Main Topics  . Smoking status: Former Games developer  . Smokeless tobacco: Never Used  . Alcohol Use: No  . Drug Use: Not on file  . Sexually Active: Not Currently   Other Topics Concern  . Not on file   Social History Narrative   Her husband died in 06/04/2011.   She has a younger friend who is helping take care of her checking daily and giving her medications.  Her dtr and son have plans to move her to an assited living situationDaughter, Clemetine Marker, is closest [place]iece, Laymond Purser lives locallySon Robbie  in [place]    Family History  Problem Relation Age of Onset  . Heart failure Father     died age 23  . Stroke Mother     died age 93    Allergies as of 08/15/2011  . (No Known Allergies)    No current facility-administered medications on file prior to encounter.   Current Outpatient Prescriptions on File Prior to Encounter  Medication Sig Dispense Refill  . hydrochlorothiazide (HYDRODIURIL) 25 MG tablet Take 1 tablet (25 mg total) by mouth daily.  30 tablet  6  . metFORMIN (GLUCOPHAGE) 1000 MG tablet Take 1 tablet (1,000 mg total) by mouth 2 (two) times daily with a meal.  60 tablet  6  . metoprolol (LOPRESSOR) 50 MG tablet Take 1 tablet (50 mg total) by mouth 2 (two) times daily.  60 tablet  5  . nitroGLYCERIN (NITRODUR - DOSED IN MG/24 HR) 0.3 mg/hr Place 1 patch onto the skin daily.      . simvastatin (  ZOCOR) 40 MG tablet Take 1 tablet (40 mg total) by mouth at bedtime.  30 tablet  5  . oxycodone (OXY-IR) 5 MG capsule Take 5 mg by mouth every 4 (four) hours as needed. For severe pain         REVIEW OF SYSTEMS: Cardiovascular: No chest pain, chest pressure, palpitations, orthopnea, or dyspnea on exertion. No claudication or rest pain,  No history of DVT or phlebitis. Pulmonary: No productive cough, asthma or wheezing. Neurologic: No weakness, paresthesias, aphasia, or amaurosis. No dizziness. Hematologic: No bleeding problems or clotting disorders. Musculoskeletal: No joint pain or joint swelling. Gastrointestinal: No blood in stool or hematemesis Genitourinary: No dysuria or hematuria. Psychiatric:: No history of major depression. Integumentary: No rashes or ulcers. Constitutional: No fever or  chills.  PHYSICAL EXAMINATION: General: The patient appears their stated age.  Vital signs are BP 118/62  Pulse 104  Temp(Src) 97.8 F (36.6 C) (Oral)  Resp 20  SpO2 96% Pulmonary: Respirations are non-labored HEENT:  No gross abnormalities Abdomen: Soft and non-tender  Musculoskeletal: There are no major deformities.   Neurologic: No motor or sensory function in the right foot and lower leg. Her sensation returns about the distal third of the lower leg Skin: There are no ulcer or rashes noted. Psychiatric: The patient has normal affect. Cardiovascular: Faintly palpable right femoral pulse. I had difficulty feeling the left femoral pulse. No Doppler signals are present in the right foot. There is a faint monophasic left posterior tibial signal. The right foot is mottled.  Diagnostic Studies: None   Assessment:  Right foot ischemia Plan: This appears to be a subacute problem as the patient states that she is been having difficulty for over one week. To further substantiate this, she states that she normally is ambulatory however she has not walked for over a week because of her foot. Unfortunately the patient does not have motor or sensory function in the foot was suggested that this has a very gram prognosis. I have reviewed a prior CT scan which was without contrast however it did show extensive calcification within the aortoiliac vessels as well as the proximal vessels of the leg. I feel the patient needs some form of diagnostic imaging to delineate her anatomy to help plan what form of intervention she needs. The best way to get this will be with an angiogram. I discussed the risks and benefits of this with the patient she wishes to proceed. I did stated this is a limb threatening situation.     Jorge Ny, M.D. [facility] [place] Office: [phone] Pager:  [phone]

## 2011-08-15 NOTE — Anesthesia Preprocedure Evaluation (Addendum)
Anesthesia Evaluation  Patient identified by MRN, date of birth, ID band Patient awake    Reviewed: Allergy & Precautions, H&P , NPO status , Patient's Chart, lab work & pertinent test results  Airway Mallampati: I TM Distance: >3 FB Neck ROM: Full    Dental  (+) Edentulous Upper, Poor Dentition and Dental Advisory Given   Pulmonary    Pulmonary exam normal       Cardiovascular hypertension, Pt. on medications and Pt. on home beta blockers + CAD     Neuro/Psych    GI/Hepatic   Endo/Other  Diabetes mellitus-, Type 1, Insulin Dependent  Renal/GU      Musculoskeletal   Abdominal   Peds  Hematology   Anesthesia Other Findings   Reproductive/Obstetrics                        Anesthesia Physical Anesthesia Plan  ASA: III and Emergent  Anesthesia Plan: General   Post-op Pain Management:    Induction: Intravenous, Cricoid pressure planned and Rapid sequence  Airway Management Planned: Oral ETT  Additional Equipment:   Intra-op Plan:   Post-operative Plan: Possible Post-op intubation/ventilation  Informed Consent: I have reviewed the patients History and Physical, chart, labs and discussed the procedure including the risks, benefits and alternatives for the proposed anesthesia with the patient or authorized representative who has indicated his/her understanding and acceptance.   Dental advisory given  Plan Discussed with: CRNA, Anesthesiologist and Surgeon  Anesthesia Plan Comments:        Anesthesia Quick Evaluation

## 2011-08-15 NOTE — Anesthesia Procedure Notes (Signed)
Procedure Name: Intubation Date/Time: 08/15/2011 8:42 PM Performed by: Alanda Amass Pre-anesthesia Checklist: Patient identified, Timeout performed, Emergency Drugs available, Suction available and Patient being monitored Patient Re-evaluated:Patient Re-evaluated prior to inductionOxygen Delivery Method: Circle System Utilized Preoxygenation: Pre-oxygenation with 100% oxygen Intubation Type: IV induction, Rapid sequence and Cricoid Pressure applied Laryngoscope Size: Mac and 3 Grade View: Grade I Tube type: Oral Number of attempts: 1 Airway Equipment and Method: stylet Placement Confirmation: ETT inserted through vocal cords under direct vision,  positive ETCO2 and breath sounds checked- equal and bilateral Secured at: 20 cm Tube secured with: Tape Dental Injury: Teeth and Oropharynx as per pre-operative assessment

## 2011-08-15 NOTE — ED Notes (Signed)
Pt's niece, Ms. Stann Mainland, notified of pt condition per pt request. # [phone]

## 2011-08-15 NOTE — ED Provider Notes (Signed)
History     CSN: [id number]  Arrival date & time 08/15/11  1341   First MD Initiated Contact with Patient 08/15/11 1355      Chief Complaint  Patient presents with  . Leg Pain    Right    (Consider location/radiation/quality/duration/timing/severity/associated sxs/prior treatment) HPI History obtained from chart provided by nursing home and patient - pt is a poor historian 2/2 dementia. She presents with pain to her R lower leg which she believes started this morning. She was seen by the NP at [facility], her nursing facility and there was concern for acute limb ischemia, so she was transferred to the ED for further eval. She has hx of CAD but denies any hx peripheral vascular disease. Pt c/o decreased sensation and pain low lower leg with limited movement.  A review of her previous chart in [facility] indicates that she was seen in the ED yesterday, 2/11. The note from that visit is not currently available, but a CT of her hips were ordered which was negative for acute pathology.  Past Medical History  Diagnosis Date  . Delirium 12/10/08    hospitalized  . Pelvic fracture 07/01/09    hospitalized  . Coronary artery disease   . Femoral neck fracture   . Anemia   . Hypertension   . Diabetes mellitus     Past Surgical History  Procedure Date  . Cholecystectomy   . Cataract extraction   L hip hemiarthroplasty  Family History  Problem Relation Age of Onset  . Heart failure Father     died age 68  . Stroke Mother     died age 74    History  Substance Use Topics  . Smoking status: Former Games developer  . Smokeless tobacco: Never Used  . Alcohol Use: No    OB History    Grav Para Term Preterm Abortions TAB SAB Ect Mult Living                  Review of Systems  Unable to perform ROS: Dementia    Allergies  Review of patient's allergies indicates no known allergies.  Home Medications   Current Outpatient Rx  Name Route Sig Dispense Refill  . ASPIRIN 81 MG PO TABS  Oral Take 81 mg by mouth daily.      Marland Kitchen VITAMIN D 1000 UNITS PO CAPS Oral Take 2 capsules (2,000 Units total) by mouth daily.    Marland Kitchen HYDROCHLOROTHIAZIDE 25 MG PO TABS Oral Take 1 tablet (25 mg total) by mouth daily. 30 tablet 6  . L-METHYLFOLATE-B6-B12 3-35-2 MG PO TABS Oral Take 1 tablet by mouth 2 (two) times daily.    Marland Kitchen METFORMIN HCL 1000 MG PO TABS Oral Take 1 tablet (1,000 mg total) by mouth 2 (two) times daily with a meal. 60 tablet 6    Please ask patient to make an appointment at our o ...  . METOPROLOL TARTRATE 50 MG PO TABS Oral Take 1 tablet (50 mg total) by mouth 2 (two) times daily. 60 tablet 5  . NITROGLYCERIN 0.3 MG/HR TD PT24 Transdermal Place 1 patch onto the skin daily.    . OXYCODONE HCL 5 MG PO CAPS Oral Take 5 mg by mouth every 4 (four) hours as needed. For severe pain    . SIMVASTATIN 40 MG PO TABS Oral Take 1 tablet (40 mg total) by mouth at bedtime. 30 tablet 5    BP 118/62  Pulse 104  Temp(Src) 97.8 F (36.6 C) (  Oral)  Resp 20  SpO2 96%  Physical Exam  Nursing note and vitals reviewed. Constitutional: She is oriented to person, place, and time. She appears well-developed and well-nourished. No distress.  HENT:  Head: Normocephalic and atraumatic.  Cardiovascular: Regular rhythm and normal heart sounds.  Tachycardia present.  Exam reveals no gallop and no friction rub.   No murmur heard. Pulses:      Femoral pulses are 1+ on the right side.      Popliteal pulses are 0 on the right side.       Dorsalis pedis pulses are 0 on the right side, and 1+ on the left side.       Posterior tibial pulses are 0 on the right side, and 1+ on the left side.       No DP/PT/pop pulses palp on R leg. Skin from mid shin down to toes is purple and mottled. Decreased sensation over this area. Unable to wiggle toes or move ankle.  Pulmonary/Chest: Effort normal and breath sounds normal.  Abdominal: Soft.  Musculoskeletal: She exhibits no edema.  Neurological: She is alert and  oriented to person, place, and time.  Skin: Skin is warm and dry. She is not diaphoretic.  Psychiatric: She has a normal mood and affect.    ED Course  Procedures (including critical care time)   Date: 08/15/2011  Rate: 100  Rhythm: sinus tachycardia  QRS Axis: normal  Intervals: normal  ST/T Wave abnormalities: normal  Conduction Disutrbances:none  Narrative Interpretation:   Old EKG Reviewed: no significant changes as compared with Jul 2010    Labs Reviewed  CBC - Abnormal; Notable for the following:    Hemoglobin 10.1 (*)    HCT 34.3 (*)    MCV 73.9 (*)    MCH 21.8 (*)    MCHC 29.4 (*)    RDW 20.6 (*)    Platelets 621 (*)    All other components within normal limits  BASIC METABOLIC PANEL - Abnormal; Notable for the following:    Glucose, Bld 166 (*)    BUN 32 (*)    Creatinine, Ser 0.46 (*)    All other components within normal limits  PROTIME-INR  DIFFERENTIAL   Dg Hip Complete Right  08/14/2011  *RADIOLOGY REPORT*  Clinical Data: Right hip pain after fall 1 week ago.  RIGHT HIP - COMPLETE 2+ VIEW  Comparison: 07/01/2009  Findings: Since the previous study, there is been interval placement of a left hip arthroplasty with bipolar femoral component.  Components appear well seated.  Skin clips consistent with recent surgery.  There are old healed fracture deformities of the right superior and inferior pubic rami.  Degenerative changes in the right hip with joint space narrowing, sclerosis, and acetabular hypertrophic changes.  Suggestion of vague cortical irregularities along the superior and inferior aspect of the acetabulum which may be related to the old fractures but nondisplaced acute fractures not excluded.  No evidence of fracture or dislocation of the right femur.  Old fracture deformity of the right iliac wing.  Vascular calcifications.  IMPRESSION: Postoperative changes in the left hip.  Old healed fracture deformities of the right iliac bone, superior, and inferior  pubic rami.  Focal cortical step off demonstrated superior and inferior to the acetabulum may be related to the previous fractures but acute fracture without displacement is not excluded.  The right proximal femur appears intact.  Original Report Authenticated By: Marlon Pel, M.D.   Ct Pelvis Wo Contrast  08/14/2011  *RADIOLOGY REPORT*  Clinical Data:  Fall 2 weeks ago.  Bilateral hip pain.  CT PELVIS WITHOUT CONTRAST  Technique:  Multidetector CT imaging of the pelvis was performed following the standard protocol without intravenous contrast.  Comparison:   CT abdomen and pelvis 07/01/2009.  Findings:  The right hip is located.  No acute fracture is present. There is a remote fracture of the inferior pubic ramus on the right.  The patient is status post left hip hemiarthroplasty.  The femoral prosthesis is well-seated.  There is no acute fracture.  Mild osteopenia is evident.  The soft tissue windows demonstrate atherosclerotic calcifications at the aortic bifurcation and iliac arteries without aneurysm.  The rectosigmoid colon is within normal limits.  The uterus and adnexa are within normal limits for age. The visualized bowel is otherwise unremarkable.  IMPRESSION:  1.  No acute fracture. 2.  Remote right inferior pubic ramus fracture. 3.  Atherosclerosis. 4.  Left hip hemiarthroplasty.  Original Report Authenticated By: Jamesetta Orleans. MATTERN, M.D.   Dg Chest Port 1 View  08/15/2011  *RADIOLOGY REPORT*  Clinical Data: 78 year old female preoperative study for right leg amputation.  Diabetes and hypertension.  PORTABLE CHEST - 1 VIEW  Comparison: 07/01/2009 and earlier.  Findings: AP portable semi upright view 1450 hours.  Stable hiatal hernia.  Stable cardiac size and mediastinal contours.  Visualized tracheal air column is within normal limits.  The lungs are clear. No pneumothorax or effusion.  IMPRESSION: No acute cardiopulmonary abnormality.  Hiatal hernia.  Original Report Authenticated By:  Ulla Potash III, M.D.     1. Right leg pain   2. Arterial occlusion       MDM  2:30 PM Pt seen with Dr. Radford Pax. DP and PT pulses are not palpable. Doppler used to attempt to locate pulses. There is arterial flow to the femoral artery, but there are no audible pulses below this. Page placed to vascular surgery. They will consult. Pt made NPO. Preop labs ordered.  3:40 PM Pt leaving dept for angiography suite.        Grant Fontana, [place] 08/15/11 1548  Grant Fontana, [place] 08/15/11 1555

## 2011-08-15 NOTE — ED Notes (Signed)
Pt c/o pain in right leg and dec sensation and movement lower down in leg. Pt staes she cannot feel her r foot but does feel pain. Pt states she is unable to move r foot. Pt Was sent from [facility] to be evaluated for arterial insufficiency

## 2011-08-15 NOTE — ED Notes (Signed)
Per ems- pt c/o right leg pain with dec sensation and limited movement. Neg for pedal pulses on right side.

## 2011-08-15 NOTE — Consult Note (Signed)
Please see my note a well

## 2011-08-16 ENCOUNTER — Encounter (HOSPITAL_COMMUNITY): Payer: Self-pay | Admitting: Surgery

## 2011-08-16 DIAGNOSIS — I743 Embolism and thrombosis of arteries of the lower extremities: Secondary | ICD-10-CM

## 2011-08-16 DIAGNOSIS — K219 Gastro-esophageal reflux disease without esophagitis: Secondary | ICD-10-CM | POA: Diagnosis present

## 2011-08-16 DIAGNOSIS — E119 Type 2 diabetes mellitus without complications: Secondary | ICD-10-CM | POA: Diagnosis present

## 2011-08-16 DIAGNOSIS — I10 Essential (primary) hypertension: Secondary | ICD-10-CM | POA: Diagnosis present

## 2011-08-16 DIAGNOSIS — I739 Peripheral vascular disease, unspecified: Secondary | ICD-10-CM | POA: Diagnosis present

## 2011-08-16 DIAGNOSIS — I2510 Atherosclerotic heart disease of native coronary artery without angina pectoris: Secondary | ICD-10-CM | POA: Diagnosis present

## 2011-08-16 DIAGNOSIS — D62 Acute posthemorrhagic anemia: Secondary | ICD-10-CM | POA: Diagnosis present

## 2011-08-16 LAB — BASIC METABOLIC PANEL
GFR calc Af Amer: >90 mL/min (ref >90)
GFR calc non Af Amer: >90 mL/min (ref >90)
Potassium: 3.7 mEq/L (ref 3.5–5.1)
Sodium: 138 mEq/L (ref 135–145)

## 2011-08-16 LAB — CBC
MCHC: 30.6 g/dL (ref 30.0–36.0)
Platelets: 320 10*3/uL (ref 150–400)
RDW: 20.8 % — ABNORMAL HIGH (ref 11.5–15.5)

## 2011-08-16 LAB — POCT I-STAT 4, (NA,K, GLUC, HGB,HCT)
Glucose, Bld: 186 mg/dL — ABNORMAL HIGH (ref 70–99)
HCT: 25 % — ABNORMAL LOW (ref 36.0–46.0)
HCT: 32 % — ABNORMAL LOW (ref 36.0–46.0)
Hemoglobin: 8.5 g/dL — ABNORMAL LOW (ref 12.0–15.0)
Sodium: 137 mEq/L (ref 135–145)

## 2011-08-16 LAB — GLUCOSE, CAPILLARY
Glucose-Capillary: 209 mg/dL — ABNORMAL HIGH (ref 70–99)
Glucose-Capillary: 196 mg/dL — ABNORMAL HIGH (ref 70–99)
Glucose-Capillary: 190 mg/dL — ABNORMAL HIGH (ref 70–99)

## 2011-08-16 LAB — ABO/RH: ABO/RH(D): O POS

## 2011-08-16 MED ORDER — DOPAMINE-DEXTROSE 3.2-5 MG/ML-% IV SOLN: 3.0000 ug/kg/min | INTRAVENOUS | Status: DC

## 2011-08-16 MED ORDER — DROPERIDOL 2.5 MG/ML IJ SOLN
0.6250 mg | INTRAMUSCULAR | Status: DC | PRN
  Filled 2011-08-16: qty 0.25

## 2011-08-16 MED ORDER — HYDRALAZINE HCL 20 MG/ML IJ SOLN
10.0000 mg | INTRAMUSCULAR | Status: DC | PRN
  Filled 2011-08-16: qty 0.5

## 2011-08-16 MED ORDER — DEXTROSE 5 % IV SOLN
1.5000 g | Freq: Two times a day (BID) | INTRAVENOUS | Status: AC
  Administered 2011-08-16 (×2): 1.5 g via INTRAVENOUS
  Filled 2011-08-16 (×2): qty 1.5

## 2011-08-16 MED ORDER — LABETALOL HCL 5 MG/ML IV SOLN
10.0000 mg | INTRAVENOUS | Status: DC | PRN
  Filled 2011-08-16: qty 4

## 2011-08-16 MED ORDER — LABETALOL HCL 5 MG/ML IV SOLN
INTRAVENOUS | Status: DC | PRN
  Administered 2011-08-16 (×2): 5 mg via INTRAVENOUS

## 2011-08-16 MED ORDER — SODIUM CHLORIDE 0.9 % IV SOLN: 500.0000 mL | Freq: Once | INTRAVENOUS | Status: AC | PRN

## 2011-08-16 MED ORDER — INSULIN ASPART 100 UNIT/ML ~~LOC~~ SOLN
0.0000 [IU] | Freq: Three times a day (TID) | SUBCUTANEOUS | Status: DC
  Administered 2011-08-16 – 2011-08-17 (×6): 2 [IU] via SUBCUTANEOUS
  Administered 2011-08-18: 5 [IU] via SUBCUTANEOUS
  Administered 2011-08-18 – 2011-08-19 (×4): 2 [IU] via SUBCUTANEOUS
  Administered 2011-08-20: 1 [IU] via SUBCUTANEOUS
  Administered 2011-08-20 – 2011-08-21 (×3): 3 [IU] via SUBCUTANEOUS
  Administered 2011-08-21 (×2): 2 [IU] via SUBCUTANEOUS

## 2011-08-16 MED ORDER — METOPROLOL TARTRATE 1 MG/ML IV SOLN: 2.0000 mg | INTRAVENOUS | Status: DC | PRN

## 2011-08-16 MED ORDER — ACETAMINOPHEN 325 MG PO TABS: 325.0000 mg | ORAL_TABLET | ORAL | Status: DC | PRN

## 2011-08-16 MED ORDER — ONDANSETRON HCL 4 MG/2ML IJ SOLN: 4.0000 mg | Freq: Four times a day (QID) | INTRAMUSCULAR | Status: DC | PRN

## 2011-08-16 MED ORDER — POTASSIUM CHLORIDE IN NACL 20-0.9 MEQ/L-% IV SOLN
INTRAVENOUS | Status: DC
  Administered 2011-08-16 (×2): via INTRAVENOUS
  Filled 2011-08-16 (×3): qty 1000

## 2011-08-16 MED ORDER — PHENOL 1.4 % MT LIQD: 1.0000 | OROMUCOSAL | Status: DC | PRN

## 2011-08-16 MED ORDER — MORPHINE SULFATE 2 MG/ML IJ SOLN: 2.0000 mg | INTRAMUSCULAR | Status: DC | PRN

## 2011-08-16 MED ORDER — TEMAZEPAM 15 MG PO CAPS: 15.0000 mg | ORAL_CAPSULE | Freq: Every evening | ORAL | Status: DC | PRN

## 2011-08-16 MED ORDER — HYDROMORPHONE HCL PF 1 MG/ML IJ SOLN
0.2500 mg | INTRAMUSCULAR | Status: DC | PRN
  Administered 2011-08-16: 0.5 mg via INTRAVENOUS
  Administered 2011-08-16 (×2): 0.25 mg via INTRAVENOUS
  Administered 2011-08-16: 0.5 mg via INTRAVENOUS
  Filled 2011-08-16: qty 1

## 2011-08-16 MED ORDER — GUAIFENESIN-DM 100-10 MG/5ML PO SYRP: 15.0000 mL | ORAL_SOLUTION | ORAL | Status: DC | PRN

## 2011-08-16 MED ORDER — ACETAMINOPHEN 650 MG RE SUPP: 325.0000 mg | RECTAL | Status: DC | PRN

## 2011-08-16 MED ORDER — POTASSIUM CHLORIDE CRYS ER 20 MEQ PO TBCR: 20.0000 meq | EXTENDED_RELEASE_TABLET | Freq: Once | ORAL | Status: AC | PRN

## 2011-08-16 MED ORDER — FAMOTIDINE IN NACL 20-0.9 MG/50ML-% IV SOLN
20.0000 mg | Freq: Two times a day (BID) | INTRAVENOUS | Status: DC
  Administered 2011-08-16 – 2011-08-17 (×4): 20 mg via INTRAVENOUS
  Filled 2011-08-16 (×5): qty 50

## 2011-08-16 MED ORDER — MAGNESIUM SULFATE 40 MG/ML IJ SOLN
2.0000 g | Freq: Once | INTRAMUSCULAR | Status: AC | PRN
  Filled 2011-08-16: qty 50

## 2011-08-16 MED ORDER — DOCUSATE SODIUM 100 MG PO CAPS
100.0000 mg | ORAL_CAPSULE | Freq: Every day | ORAL | Status: DC
  Administered 2011-08-17 – 2011-08-21 (×5): 100 mg via ORAL
  Filled 2011-08-16 (×5): qty 1

## 2011-08-16 NOTE — Progress Notes (Signed)
Patient requested that the ABI study be done tomorrow. Dr. Myra Gianotti , Newton Pigg PA who was with Dr. Darrick Penna were paged and permission from all three to delay to tomorrow 08/17/11 was approved. Patient notified that her request was accepted.   Jorma Tassinari, [place] D., RVS 08/16/2011 1225

## 2011-08-16 NOTE — Transfer of Care (Signed)
Immediate Anesthesia Transfer of Care Note  Patient: Mackenzie [name]  Procedure(s) Performed: Procedure(s) (LRB): BYPASS GRAFT AXILLA-BIFEMORAL (Right)  Patient Location: PACU  Anesthesia Type: General  Level of Consciousness: sedated  Airway & Oxygen Therapy: Patient Spontanous Breathing and Patient connected to nasal cannula oxygen  Post-op Assessment: Report given to PACU RN and Post -op Vital signs reviewed and stable  Post vital signs: Reviewed and stable  Complications: No apparent anesthesia complications

## 2011-08-16 NOTE — Progress Notes (Addendum)
[facility] Progress Note  08/16/2011 7:43 AM POD 1  Subjective:  No complaints   Filed Vitals:   08/16/11 0400  BP: 121/42  Pulse:   Temp: 98.2 F (36.8 C)  Resp:     Physical Exam: Incisions:  Right groin with wound vac in place.  Lateral abdominal and right chest bandages are clean and in tact. Extremities:  There is no doppler signal in the right pedal pulses.  There is questionable doppler pulse in the left PT/DP.  She has no motor or sensation in tact in the right foot.  Her Motor and sensation are in tact on the left foot.  CBC    Component Value Date/Time   WBC 8.6 08/15/2011 1434   RBC 4.64 08/15/2011 1434   HGB 8.5* 08/15/2011 2355   HCT 25.0* 08/15/2011 2355   PLT 621* 08/15/2011 1434   MCV 73.9* 08/15/2011 1434   MCH 21.8* 08/15/2011 1434   MCHC 29.4* 08/15/2011 1434   RDW 20.6* 08/15/2011 1434   LYMPHSABS 1.8 08/15/2011 1434   MONOABS 1.0 08/15/2011 1434   EOSABS 0.1 08/15/2011 1434   BASOSABS 0.1 08/15/2011 1434    BMET    Component Value Date/Time   NA 138 08/15/2011 2355   K 3.4* 08/15/2011 2355   CL 96 08/15/2011 1434   CO2 24 08/15/2011 1434   GLUCOSE 186* 08/15/2011 2355   BUN 32* 08/15/2011 1434   CREATININE 0.46* 08/15/2011 1434   CREATININE 0.58 05/24/2011 1046   CALCIUM 10.4 08/15/2011 1434   GFRNONAA >90 08/15/2011 1434   GFRAA >90 08/15/2011 1434    INR    Component Value Date/Time   INR 0.98 08/15/2011 1434     Intake/Output Summary (Last 24 hours) at 08/16/11 0743 Last data filed at 08/16/11 0437  Gross per 24 hour  Intake   2925 ml  Output    796 ml  Net   2129 ml     Assessment/Plan:  78 y.o. female is s/p right Ax to femoral BPG POD 1 -pt has no doppler pedal pulses on the right and the left is questionable.  Her motor and sensation are not present in the right foot, but in tact in the left. -will leave pt NPO today for possible right AKA today. -ck labs this am.   Newton Pigg, PA-C [facility] [phone] 08/16/2011 7:43 AM    Agree with the above.  Color is a little better in the right foot and it is warmer.  However she does not have motor or sensory function.  I will let her go ahead and eat today. She remains at hight risk for amputation.  Durene Cal

## 2011-08-16 NOTE — Progress Notes (Signed)
Utilization review completed. Pearse Shiffler, RN, BSN. 08/16/11  

## 2011-08-16 NOTE — Op Note (Signed)
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  Distal anastomosis of her ax fem was to a cuff of her aortobifemoral graft.    Indications: The patient presented to the ER with complaints of right foot pain which has been going on for at least one week.  On exam her right foot is mottled.  She had no motor or sensory function.  She states that she was able to walk one week ago, but has not walked since due to pain in her leg.  I tried to perform an arteriogram but encountered iliac occlusion, so I sent her for a CTAangiogram.  On review of her CTA, I found that she has had an aortobifemoral bypass, which she had not told me about.  It was done in the remote past.  On review of her old CT scans, she has chronic occlusion of her left limb.  Her right limb was patent on a scan a few years ago.  I spoke with her power of attorney and her daughter.  I stressed that she has a very high probability of limb loss.  I also stressed the possibility of major complications if we attempted revascularization.  These include death, cardiopulmonary complications, wound healing issues, infection, and renal issues.  In addition due to her symptoms on presentation, even with successful revascularization she has a high risk of limb loss.  They understood the risks but were very stern about attempting revascularization.  Procedure:  The patient was identified in the holding area and taken to [facility] OR ROOM 06  The patient was then placed supine on the table. general  anesthesia was administered.  The patient was prepped and draped in the usual sterile fashion.  A time out was called and antibiotics were administered.  I initially began in the right groin.  The previous vertical incision was re-opened.  I exposed the right limb of the aortobifemoral graft and then isolated the proximal common carotid artery as well as the SFA and profunda femoral arteries.  I dissected out the first 2 profunda branches.  I then heparinized the patient.  I opened the distal right limb of her aortobifemoral graft. There was chronic thrombus.  I was able to remove this from the proximal SFA and profunda and established back bleeding by passing #3 and #4 forarty catheters.  A could not pass the Fogarty past the mid thigh through the SFA.  I then tried to thrombectomize the aortic graft.  I was able to get a #6 Fogarty proximally as well as a CODA balloon, however despite multiple passes, I could not establish adequate inflow.  I therefore elected to over sew the proximal limb of the graft with a 5-0 prolene, and proceed with axillary bypass.  At this time a aborted the idea of a Ax-Bi fem graft as the patient was not making a lot of urine, and I didn't was  To expose her to the additional anesthesia time that would be required for left groin exposure since it would also be a redo.  I then exposed the right axillary  artery through a infraclavicular incision. I had to divide the majority of the pec minor.  There was a good axillary pulse.  I then created a tunnel between the 2 incisions.  I made a counter incision, because she has a large hernia, and I wanted to make sure I stayed extra-peritoneal.  I then reheparinized the patient.  The axillary was occluded.  A longitudinal arteriotomy was made.  A 8mm external ringed PTFE Propatent graft was brought onto the field.  The proximal anastomosis was created with CV6 gore suture.  Appropriate flush maneuvers were performed prior to completion of the  anastomosis.  There was excellent pulsatile flow through the graft.  It was then re-occluded and flushed with heparinized saline.  The distal anastomosis was to a cuff of the prior aortobifemoral graft with CV6 suture.  Prior to completion the graft and native vessels were flushed and the anastomosis was completed.  Doppler examination revealed multiphasic profunda and SFA signals.  I could not hear pedal signals.  Due to the time already spent in the operating room in addition to her pre-operative exam and co-morbidities, I elected not to proceed with additional attempts at revascularization as I thought the risks did not outweigh the benefits. I also did not feel fasciotomies were needed at this time.  I did not reverse the heparin.  The groin was closed with several layers of 2-0 and 3-0 vicryl.  Staples were placed on the skin.  An incisional vac was placed.  The axillary incision was closed with 2-0 and 3-0 vicryl.  The counter incision was closed with 3-0 and 4-0 vicryl.  There were no complications   Disposition:  To PACU in stable condition.   [name], M.D. [facility] [place] Office: [phone] Pager:  [phone]

## 2011-08-16 NOTE — Anesthesia Postprocedure Evaluation (Signed)
  Anesthesia Post-op Note  Patient: Mackenzie [name]  Procedure(s) Performed: Procedure(s) (LRB): BYPASS GRAFT AXILLA-BIFEMORAL (Right)  Patient Location: PACU  Anesthesia Type: General  Level of Consciousness: awake  Airway and Oxygen Therapy: Patient Spontanous Breathing and Patient connected to nasal cannula oxygen  Post-op Pain: none  Post-op Assessment: Post-op Vital signs reviewed, Patient's Cardiovascular Status Stable, Respiratory Function Stable, Patent Airway, No signs of Nausea or vomiting and Pain level controlled  Post-op Vital Signs: Reviewed and stable  Complications: No apparent anesthesia complications

## 2011-08-16 NOTE — Addendum Note (Signed)
Addendum  created 08/16/11 0145 by Remonia Richter, MD   Modules edited:Orders, PRL Based Order Sets

## 2011-08-16 NOTE — Anesthesia Postprocedure Evaluation (Signed)
Anesthesia Post Note  Patient: Mackenzie [name]  Procedure(s) Performed: Procedure(s) (LRB): BYPASS GRAFT AXILLA-BIFEMORAL (Right)  Anesthesia type: general  Patient location: PACU  Post pain: Pain level controlled  Post assessment: Patient's Cardiovascular Status Stable  Last Vitals:  Filed Vitals:   08/15/11 1837  BP: 128/44  Pulse: 111  Temp: 36.7 C  Resp: 21    Post vital signs: Reviewed and stable  Level of consciousness: sedated  Complications: No apparent anesthesia complications

## 2011-08-16 NOTE — Consults (Signed)
Requesting physician: Dr Durene Cal.  Reason for consultation: Peri-operative management of medical issues.  History of Present Illness: This is a an 78 year old female, hospitalized on 08/15/11, for an ischemic RLE, confirmed by angiogram on 08/15/11, to be secondary to occluded iliac arteries. Surgery was anticipated, and the medical service consulted to assist with peri-operative management, as well as to consider possible take over of care, post surgery. She has a rather complex medical history, consisting of CAD, HTN, dyslipidemia, type 2 diabetes mellitus, chronic anemia, depression, anxiety, GERD, thyroid nodule, psoriasis, macular degeneration, recurrent falls, complicated by pelvic fractures. She is status post prior cholecystectomy, cataract surgery, and has a history of possible dementia.  Allergies:  No Known Allergies    Past Medical History  Diagnosis Date  . Delirium 12/10/08    hospitalized  . Pelvic fracture 07/01/09    hospitalized  . Coronary artery disease   . Femoral neck fracture   . Anemia   . Hypertension   . Diabetes mellitus     Past Surgical History  Procedure Date  . Cholecystectomy   . Cataract extraction     Scheduled Meds:    . cefUROXime (ZINACEF)  IV  1.5 g Intravenous On Call to OR  . cefUROXime (ZINACEF)  IV  1.5 g Intravenous Q12H  . docusate sodium  100 mg Oral Daily  . donepezil  5 mg Oral QHS  . famotidine (PEPCID) IV  20 mg Intravenous Q12H  . fentaNYL      . fentaNYL  12.5 mcg Intravenous Once  . heparin      . hydrochlorothiazide  25 mg Oral Daily  . insulin aspart  0-9 Units Subcutaneous TID WC  . lidocaine      . metoprolol  50 mg Oral BID  . midazolam      . nitroGLYCERIN  0.3 mg Transdermal Daily  . sertraline  50 mg Oral QHS  . simvastatin  40 mg Oral QHS  . DISCONTD: enoxaparin  30 mg Subcutaneous Q12H  . DISCONTD: fentaNYL  25 mcg Intravenous Once  . DISCONTD: insulin aspart  2-8 Units Subcutaneous TID PC & HS  .  DISCONTD: metFORMIN  1,000 mg Oral BID WC   Continuous Infusions:    . 0.9 % NaCl with KCl 20 mEq / L 75 mL/hr at 08/16/11 1100  . DOPamine    . DISCONTD: sodium chloride     PRN Meds:.sodium chloride, acetaminophen, acetaminophen, droperidol, guaiFENesin-dextromethorphan, hydrALAZINE, HYDROmorphone, iohexol, labetalol, magnesium hydroxide, magnesium sulfate 1 - 4 g bolus IVPB, metoprolol, morphine injection, ondansetron (ZOFRAN) IV, ondansetron, oxyCODONE, phenol, potassium chloride, temazepam, DISCONTD: 0.9 % irrigation (POUR BTL), DISCONTD: acetaminophen, DISCONTD: alum & mag hydroxide-simeth, DISCONTD: bisacodyl DISCONTD: hemostatic agents, DISCONTD: heparin 6000 unit irrigation, DISCONTD: oxycodone  Social History:  reports that she has quit smoking. She has never used smokeless tobacco. She reports that she does not drink alcohol. Her drug history not on file.  Family History  Problem Relation Age of Onset  . Heart failure Father     died age 69  . Stroke Mother     died age 21    Review of Systems: As per HPI and chief complaint. Patent denies fatigue, diminished appetite, weight loss, fever, chills, headache, blurred vision, difficulty in speaking, dysphagia, chest pain, cough, shortness of breath, orthopnea, paroxysmal nocturnal dyspnea, nausea, diaphoresis, abdominal pain, vomiting, diarrhea, belching, heartburn, hematemesis, melena, dysuria, nocturia, urinary frequency, hematochezia. Right lower extremity feels "better". The rest of the systems review  is negative.   Physical Exam: Blood pressure 160/43, pulse 118, temperature 98.8 F (37.1 C), temperature source Oral, resp. rate 16, height 5\' 7"  (1.702 m), weight 77.4 kg (170 lb 10.2 oz), SpO2 98.00%. General:  Patient does not appear to be in obvious acute distress. Alert, communicative, oriented, talking in complete sentences, not short of breath at rest.  HEENT:  Moderate clinical pallor, no jaundice, no conjunctival  injection or discharge. Hydration status appears fair. NECK:  Supple, JVP not seen, no carotid bruits, no palpable lymphadenopathy, no palpable goiter. CHEST:  Clinically clear to auscultation, no wheezes, no crackles. Patient has clean, dry dressings in right subclavian region. HEART:  Sounds 1 and 2 heard, normal, regular, no murmurs. ABDOMEN:  Full, non-tender, no palpable organomegaly, no palpable masses, normal bowel sounds. GENITALIA:  Not examined.. LOWER EXTREMITIES:  Wound vac is noted at top of right thigh. RLE is warm to touch ,and appears perfused. MUSCULOSKELETAL SYSTEM:  Generalized osteoarthritic changes, otherwise, normal. CENTRAL NERVOUS SYSTEM:  No focal neurologic deficit on gross examination.  Labs on Admission:  Results for orders placed during the hospital encounter of 08/15/11 (from the past 48 hour(s))  PROTIME-INR     Status: Normal   Collection Time   08/15/11  2:34 PM      Component Value Range Comment   Prothrombin Time 13.2  11.6 - 15.2 (seconds)    INR 0.98  0.00 - 1.49    CBC     Status: Abnormal   Collection Time   08/15/11  2:34 PM      Component Value Range Comment   WBC 8.6  4.0 - 10.5 (K/uL)    RBC 4.64  3.87 - 5.11 (MIL/uL)    Hemoglobin 10.1 (*) 12.0 - 15.0 (g/dL)    HCT 78.4 (*) 69.6 - 46.0 (%)    MCV 73.9 (*) 78.0 - 100.0 (fL)    MCH 21.8 (*) 26.0 - 34.0 (pg)    MCHC 29.4 (*) 30.0 - 36.0 (g/dL)    RDW 29.5 (*) 28.4 - 15.5 (%)    Platelets 621 (*) 150 - 400 (K/uL)   DIFFERENTIAL     Status: Normal   Collection Time   08/15/11  2:34 PM      Component Value Range Comment   Neutrophils Relative 65  43 - 77 (%)    Lymphocytes Relative 21  12 - 46 (%)    Monocytes Relative 12  3 - 12 (%)    Eosinophils Relative 1  0 - 5 (%)    Basophils Relative 1  0 - 1 (%)    Neutro Abs 5.6  1.7 - 7.7 (K/uL)    Lymphs Abs 1.8  0.7 - 4.0 (K/uL)    Monocytes Absolute 1.0  0.1 - 1.0 (K/uL)    Eosinophils Absolute 0.1  0.0 - 0.7 (K/uL)    Basophils Absolute 0.1   0.0 - 0.1 (K/uL)    RBC Morphology POLYCHROMASIA PRESENT   SPHEROCYTES   Smear Review LARGE PLATELETS PRESENT     BASIC METABOLIC PANEL     Status: Abnormal   Collection Time   08/15/11  2:34 PM      Component Value Range Comment   Sodium 138  135 - 145 (mEq/L)    Potassium 3.9  3.5 - 5.1 (mEq/L)    Chloride 96  96 - 112 (mEq/L)    CO2 24  19 - 32 (mEq/L)    Glucose, Bld 166 (*) 70 - 99 (  mg/dL)    BUN 32 (*) 6 - 23 (mg/dL)    Creatinine, Ser 8.46 (*) 0.50 - 1.10 (mg/dL)    Calcium 96.2  8.4 - 10.5 (mg/dL)    GFR calc non Af Amer >90  >90 (mL/min)    GFR calc Af Amer >90  >90 (mL/min)   GLUCOSE, CAPILLARY     Status: Abnormal   Collection Time   08/15/11  4:44 PM      Component Value Range Comment   Glucose-Capillary 171 (*) 70 - 99 (mg/dL)   SURGICAL PCR SCREEN     Status: Normal   Collection Time   08/15/11  6:31 PM      Component Value Range Comment   MRSA, PCR NEGATIVE  NEGATIVE     Staphylococcus aureus NEGATIVE  NEGATIVE    POCT I-STAT 4, (NA,K, GLUC, HGB,HCT)     Status: Abnormal   Collection Time   08/15/11  9:37 PM      Component Value Range Comment   Sodium 137  135 - 145 (mEq/L)    Potassium 3.9  3.5 - 5.1 (mEq/L)    Glucose, Bld 149 (*) 70 - 99 (mg/dL)    HCT 95.2 (*) 84.1 - 46.0 (%)    Hemoglobin 10.9 (*) 12.0 - 15.0 (g/dL)   POCT I-STAT 4, (NA,K, GLUC, HGB,HCT)     Status: Abnormal   Collection Time   08/15/11 11:55 PM      Component Value Range Comment   Sodium 138  135 - 145 (mEq/L)    Potassium 3.4 (*) 3.5 - 5.1 (mEq/L)    Glucose, Bld 186 (*) 70 - 99 (mg/dL)    HCT 32.4 (*) 40.1 - 46.0 (%)    Hemoglobin 8.5 (*) 12.0 - 15.0 (g/dL)   TYPE AND SCREEN     Status: Normal (Preliminary result)   Collection Time   08/16/11 12:05 AM      Component Value Range Comment   ABO/RH(D) O POS      Antibody Screen NEG      Sample Expiration 08/19/2011      PT AG Type POSITIVE FOR E ANTIGEN NEGATIVE FOR C ANTIGEN      Unit Number [id number]      Blood Component Type RED  CELLS,LR      Unit division 00      Status of Unit ALLOCATED      Transfusion Status OK TO TRANSFUSE      Crossmatch Result Compatible      Unit Number [id number]      Blood Component Type RED CELLS,LR      Unit division 00      Status of Unit ALLOCATED      Transfusion Status OK TO TRANSFUSE      Crossmatch Result Compatible     ABO/RH     Status: Normal   Collection Time   08/16/11 12:05 AM      Component Value Range Comment   ABO/RH(D) O POS     PREPARE RBC (CROSSMATCH)     Status: Normal   Collection Time   08/16/11 12:30 AM      Component Value Range Comment   Order Confirmation ORDER PROCESSED BY BLOOD BANK     GLUCOSE, CAPILLARY     Status: Abnormal   Collection Time   08/16/11  1:43 AM      Component Value Range Comment   Glucose-Capillary 209 (*) 70 - 99 (mg/dL)   GLUCOSE, CAPILLARY  Status: Abnormal   Collection Time   08/16/11  8:43 AM      Component Value Range Comment   Glucose-Capillary 171 (*) 70 - 99 (mg/dL)   CBC     Status: Abnormal   Collection Time   08/16/11  8:47 AM      Component Value Range Comment   WBC 5.6  4.0 - 10.5 (K/uL)    RBC 3.56 (*) 3.87 - 5.11 (MIL/uL)    Hemoglobin 8.1 (*) 12.0 - 15.0 (g/dL)    HCT 16.1 (*) 09.6 - 46.0 (%)    MCV 74.4 (*) 78.0 - 100.0 (fL)    MCH 22.8 (*) 26.0 - 34.0 (pg)    MCHC 30.6  30.0 - 36.0 (g/dL)    RDW 04.5 (*) 40.9 - 15.5 (%)    Platelets 320  150 - 400 (K/uL) DELTA CHECK NOTED  BASIC METABOLIC PANEL     Status: Abnormal   Collection Time   08/16/11  8:47 AM      Component Value Range Comment   Sodium 138  135 - 145 (mEq/L)    Potassium 3.7  3.5 - 5.1 (mEq/L)    Chloride 105  96 - 112 (mEq/L) DELTA CHECK NOTED   CO2 23  19 - 32 (mEq/L)    Glucose, Bld 171 (*) 70 - 99 (mg/dL)    BUN 24 (*) 6 - 23 (mg/dL)    Creatinine, Ser 8.11 (*) 0.50 - 1.10 (mg/dL)    Calcium 8.5  8.4 - 10.5 (mg/dL)    GFR calc non Af Amer >90  >90 (mL/min)    GFR calc Af Amer >90  >90 (mL/min)   GLUCOSE, CAPILLARY     Status:  Abnormal   Collection Time   08/16/11 12:25 PM      Component Value Range Comment   Glucose-Capillary 196 (*) 70 - 99 (mg/dL)    Comment 1 Notify RN      Comment 2 Documented in Chart       Radiological Exams on Admission: Dg Hip Complete Right  08/14/2011  *RADIOLOGY REPORT*  Clinical Data: Right hip pain after fall 1 week ago.  RIGHT HIP - COMPLETE 2+ VIEW  Comparison: 07/01/2009  Findings: Since the previous study, there is been interval placement of a left hip arthroplasty with bipolar femoral component.  Components appear well seated.  Skin clips consistent with recent surgery.  There are old healed fracture deformities of the right superior and inferior pubic rami.  Degenerative changes in the right hip with joint space narrowing, sclerosis, and acetabular hypertrophic changes.  Suggestion of vague cortical irregularities along the superior and inferior aspect of the acetabulum which may be related to the old fractures but nondisplaced acute fractures not excluded.  No evidence of fracture or dislocation of the right femur.  Old fracture deformity of the right iliac wing.  Vascular calcifications.  IMPRESSION: Postoperative changes in the left hip.  Old healed fracture deformities of the right iliac bone, superior, and inferior pubic rami.  Focal cortical step off demonstrated superior and inferior to the acetabulum may be related to the previous fractures but acute fracture without displacement is not excluded.  The right proximal femur appears intact.  Original Report Authenticated By: Marlon Pel, M.D.   Ct Pelvis Wo Contrast  08/14/2011  *RADIOLOGY REPORT*  Clinical Data:  Fall 2 weeks ago.  Bilateral hip pain.  CT PELVIS WITHOUT CONTRAST  Technique:  Multidetector CT imaging of the pelvis was performed  following the standard protocol without intravenous contrast.  Comparison:   CT abdomen and pelvis 07/01/2009.  Findings:  The right hip is located.  No acute fracture is present. There  is a remote fracture of the inferior pubic ramus on the right.  The patient is status post left hip hemiarthroplasty.  The femoral prosthesis is well-seated.  There is no acute fracture.  Mild osteopenia is evident.  The soft tissue windows demonstrate atherosclerotic calcifications at the aortic bifurcation and iliac arteries without aneurysm.  The rectosigmoid colon is within normal limits.  The uterus and adnexa are within normal limits for age. The visualized bowel is otherwise unremarkable.  IMPRESSION:  1.  No acute fracture. 2.  Remote right inferior pubic ramus fracture. 3.  Atherosclerosis. 4.  Left hip hemiarthroplasty.  Original Report Authenticated By: Jamesetta Orleans. MATTERN, M.D.   Ct Angio Ao+bifem W/cm &/or Wo/cm  08/15/2011  *RADIOLOGY REPORT*  Clinical Data: Bilateral leg pain, vascular insufficiency  CT ANGIOGRAPHY AOBIFEM WITHOUT AND WITH CONTRAST  Comparison: 07/01/2009  Findings: Abdominal aorta:  There is diffuse calcified atheromatous plaque proximally.  There is infrarenal occlusion, which is new since the previous study.  There is an infrarenal aortobifemoral graft with both limbs occluded.  Celiac axis:  Calcified origin plaque resulting at least mild stenosis, patent distally  Superior mesenteric artery:  Patchy calcified plaque in its proximal aspect without high-grade stenosis, patent distally with classic distal branching  Left renal artery:  Duplicated, inferior moiety dominant, widely patent  Right renal artery:  Single, widely patent  Inferior mesenteric artery:  Occluded proximally, reconstituted distally by visceral collaterals  Left iliac system:  There is collateral reconstitution of the external and internal iliac flow with scattered atheromatous plaque, no aneurysm or high-grade stenosis.  Right iliac system:  There is collateral reconstitution of external and internal iliac flow without aneurysm or high-grade stenosis  Left lower extremity:  Occluded graft to the common  femoral artery. The common femoral artery is patent.  There is fairly heavily calcified atheromatous plaque in the proximal SFA, with tandem high- grade stenoses or occlusions in its proximal and midsegment.  There is     segmental reconstitution of the popliteal artery.  There is proximal posterior tibial reconstitution, and this vessel does appear contiguous across the ankle as the primary blood supply to the foot.  Right lower extremity:  Graft to the common femoral artery is thrombosed.  The common femoral artery is patent.  There is extensive calcified plaque in the proximal SFA with tandem high- grade stenoses or short segment occlusions in its mid and distal portion, as well as segmental occlusion of the proximal popliteal artery.  There is collateral reconstitution of proximal trifurcation runoff but there is inadequate contrast below the mid calf to assess distal runoff completely.  Venous phase:  Not obtained  Nonvascular findings:  Visualized lung bases clear.  Previous cholecystectomy with stable central intrahepatic biliary ductal dilatation and dilated common bile duct.  Unremarkable arterial phase evaluation of the liver, spleen, adrenal glands, pancreas. Tiny bilateral renal cysts.  The renal parenchymal enhancement is normal otherwise.  Pancreas unremarkable.  Portal vein patent. Small bowel and colon decompressed.  Scattered sigmoid diverticula. Right inguinal hernia containing only mesenteric fat.  No ascites. No free air.  Left hip arthroplasty hardware results in streak artifact degrading portions of the scan.  Postoperative and degenerative changes in the right ankle.  IMPRESSION: 1.  Complete thrombosis of the aortobifemoral graft.  The right limb  occlusion is new since the 2010 scan. 2.  Diffuse right femoropopliteal occlusive disease with incomplete evaluation of distal trifurcation runoff as above. 3.  Diffuse left femoropopliteal disease with reconstitution of contiguous posterior tibial  runoff. 4.  Right inguinal hernia containing only mesenteric fat. 5.  Scattered sigmoid diverticula.  I telephoned the critical test results to Dr. Myra Gianotti at the time of interpretation.  Original Report Authenticated By: Osa Craver, M.D.   Dg Chest Port 1 View  08/15/2011  *RADIOLOGY REPORT*  Clinical Data: 78 year old female preoperative study for right leg amputation.  Diabetes and hypertension.  PORTABLE CHEST - 1 VIEW  Comparison: 07/01/2009 and earlier.  Findings: AP portable semi upright view 1450 hours.  Stable hiatal hernia.  Stable cardiac size and mediastinal contours.  Visualized tracheal air column is within normal limits.  The lungs are clear. No pneumothorax or effusion.  IMPRESSION: No acute cardiopulmonary abnormality.  Hiatal hernia.  Original Report Authenticated By: Harley Hallmark, M.D.    Assessment/Plan/Recommendations: Principal Problem:   *PVD (peripheral vascular disease): Patient is status post angiogram and revascularization procedure, ie,right Ax to femoral BPG on 08/15/11. At this time it is uncertain wether she will be able to avoid right AKA. Manage per surgical team. Active Problems:  1. Anemia: Acute blood loss on chronic. Will follow CBC for now, and transfuse as indicated.  2. DM (diabetes mellitus): Appears reasonably, albeit sub-optimally controlled at this time. As patent is to be NPO, will hold off on further adjustments of diabetic regimen for now. Check HBA1C, for completeness. Continue SSI.  3. HTN (hypertension): Controlled.  4. GERD (gastroesophageal reflux disease): Asymptomatic.  5. CAD (coronary artery disease): Asymptomatic, without anginal equivalent or evidence of CHF. Continue Beta-blocker.  6. History of Anxiety/Deprtession: Mood seems stable at this time.  7. Dyslipidemia: On Statin. Check Lipid profile and TSH.  Comment: Thanks for the consultation, we shall follow with you.   Time Spent on Admission: 35  mins.  Sameera Betton,CHRISTOPHER 08/16/2011, 1:13 PM

## 2011-08-17 DIAGNOSIS — Z48812 Encounter for surgical aftercare following surgery on the circulatory system: Secondary | ICD-10-CM

## 2011-08-17 DIAGNOSIS — R7989 Other specified abnormal findings of blood chemistry: Secondary | ICD-10-CM | POA: Diagnosis not present

## 2011-08-17 DIAGNOSIS — E876 Hypokalemia: Secondary | ICD-10-CM | POA: Diagnosis not present

## 2011-08-17 LAB — BASIC METABOLIC PANEL
Calcium: 8.7 mg/dL (ref 8.4–10.5)
GFR calc non Af Amer: >90 mL/min (ref >90)
Glucose, Bld: 168 mg/dL — ABNORMAL HIGH (ref 70–99)
Sodium: 135 mEq/L (ref 135–145)

## 2011-08-17 LAB — CBC
Hemoglobin: 8.1 g/dL — ABNORMAL LOW (ref 12.0–15.0)
MCH: 22.4 pg — ABNORMAL LOW (ref 26.0–34.0)
MCHC: 30.5 g/dL (ref 30.0–36.0)
Platelets: 301 10*3/uL (ref 150–400)

## 2011-08-17 LAB — GLUCOSE, CAPILLARY
Glucose-Capillary: 163 mg/dL — ABNORMAL HIGH (ref 70–99)
Glucose-Capillary: 234 mg/dL — ABNORMAL HIGH (ref 70–99)

## 2011-08-17 LAB — LIPID PANEL
LDL Cholesterol: 51 mg/dL (ref 0–99)
Total CHOL/HDL Ratio: 2.9 RATIO
VLDL: 16 mg/dL (ref 0–40)

## 2011-08-17 MED ORDER — POTASSIUM CHLORIDE CRYS ER 20 MEQ PO TBCR
40.0000 meq | EXTENDED_RELEASE_TABLET | Freq: Once | ORAL | Status: AC
  Administered 2011-08-17: 40 meq via ORAL

## 2011-08-17 MED ORDER — INFLUENZA VIRUS VACC SPLIT PF IM SUSP
0.5000 mL | INTRAMUSCULAR | Status: AC
  Administered 2011-08-18: 0.5 mL via INTRAMUSCULAR
  Filled 2011-08-17: qty 0.5

## 2011-08-17 MED ORDER — POTASSIUM CHLORIDE CRYS ER 20 MEQ PO TBCR
20.0000 meq | EXTENDED_RELEASE_TABLET | Freq: Two times a day (BID) | ORAL | Status: AC
  Administered 2011-08-17 (×2): 20 meq via ORAL
  Filled 2011-08-17 (×2): qty 1

## 2011-08-17 MED ORDER — GLUCERNA SHAKE PO LIQD
237.0000 mL | Freq: Three times a day (TID) | ORAL | Status: DC
  Administered 2011-08-17 – 2011-08-20 (×6): 237 mL via ORAL
  Filled 2011-08-17 (×2): qty 237

## 2011-08-17 MED ORDER — FAMOTIDINE 20 MG PO TABS
20.0000 mg | ORAL_TABLET | Freq: Two times a day (BID) | ORAL | Status: DC
  Administered 2011-08-17 – 2011-08-21 (×8): 20 mg via ORAL
  Filled 2011-08-17 (×9): qty 1

## 2011-08-17 NOTE — Progress Notes (Signed)
*  PRELIMINARY RESULTS* Vascular Ultrasound  VASCULAR LAB PRELIMINARY  PRELIMINARY  PRELIMINARY  PRELIMINARY  ARTERIAL  ABI completed: Right ABI shows severe disease. Left ABI could not be obtained. Left PT venous flow is obtained at ankle. Dampened monophasic flow in popliteal fossa, but not obtained distally.    RIGHT    LEFT    PRESSURE WAVEFORM  PRESSURE WAVEFORM  BRACHIAL 168 Tri BRACHIAL 140 Tri  DP   DP    PT Not obtained  PT Not obtained   AT 29 Dampened mono AT Not obtained   PER Not obtained  PER Not obtained   GREAT TOE   GREAT TOE      RIGHT LEFT  ABI 0.17 ---     Farrel Demark, RDMS 08/17/2011, 12:27 PM

## 2011-08-17 NOTE — Progress Notes (Signed)
INITIAL ADULT NUTRITION ASSESSMENT Date: 08/17/2011   Time: 12:18 PM  Reason for Assessment: Low Braden  ASSESSMENT: Female 78 y.o.  Dx: PVD (peripheral vascular disease); Ischemic right leg; S/P Redo right common femoral exposure, right axillary to femoral bypass   Hx:  Past Medical History  Diagnosis Date  . Delirium 12/10/08    hospitalized  . Pelvic fracture 07/01/09    hospitalized  . Coronary artery disease   . Femoral neck fracture   . Anemia   . Hypertension   . Diabetes mellitus     Related Meds:  Scheduled Meds:   . cefUROXime (ZINACEF)  IV  1.5 g Intravenous Q12H  . docusate sodium  100 mg Oral Daily  . donepezil  5 mg Oral QHS  . famotidine (PEPCID) IV  20 mg Intravenous Q12H  . hydrochlorothiazide  25 mg Oral Daily  . insulin aspart  0-9 Units Subcutaneous TID WC  . metoprolol  50 mg Oral BID  . nitroGLYCERIN  0.3 mg Transdermal Daily  . potassium chloride  20 mEq Oral BID WC  . potassium chloride  40 mEq Oral Once  . sertraline  50 mg Oral QHS  . simvastatin  40 mg Oral QHS   Continuous Infusions:   . DISCONTD: 0.9 % NaCl with KCl 20 mEq / L 75 mL/hr at 08/17/11 0600  . DISCONTD: DOPamine     PRN Meds:.sodium chloride, acetaminophen, acetaminophen, droperidol, guaiFENesin-dextromethorphan, hydrALAZINE, HYDROmorphone, labetalol, magnesium hydroxide, magnesium sulfate 1 - 4 g bolus IVPB, metoprolol, morphine injection, ondansetron (ZOFRAN) IV, ondansetron, oxyCODONE, phenol, potassium chloride, temazepam   Ht: 5\' 7"  (170.2 cm)  Wt: 170 lb 10.2 oz (77.4 kg)  Ideal Wt: 61.4 kg % Ideal Wt: 126%  Wt Readings from Last 12 Encounters:  08/15/11 170 lb 10.2 oz (77.4 kg)  08/15/11 170 lb 10.2 oz (77.4 kg)  08/15/11 170 lb 10.2 oz (77.4 kg)  05/24/11 174 lb (78.926 kg)  02/20/11 169 lb (76.658 kg)  01/11/09 184 lb (83.462 kg)  12/14/08 186 lb 1.6 oz (84.414 kg)  04/09/08 182 lb (82.555 kg)  03/27/08 183 lb (83.008 kg)  01/23/08 189 lb (85.73 kg)    11/28/07 195 lb (88.451 kg)  10/28/07 201 lb 9.6 oz (91.445 kg)   Usual Wt: 174 lb (3 months ago) % Usual Wt: 98%  Body mass index is 26.73 kg/(m^2).  Food/Nutrition Related Hx: No nutrition problems identified on admission nutrition screening.  Labs:  CMP     Component Value Date/Time   NA 135 08/17/2011 0422   K 3.1* 08/17/2011 0422   CL 100 08/17/2011 0422   CO2 26 08/17/2011 0422   GLUCOSE 168* 08/17/2011 0422   BUN 11 08/17/2011 0422   CREATININE 0.36* 08/17/2011 0422   CREATININE 0.58 05/24/2011 1046   CALCIUM 8.7 08/17/2011 0422   PROT 6.8 05/24/2011 1046   ALBUMIN 3.9 05/24/2011 1046   AST 23 05/24/2011 1046   ALT 13 05/24/2011 1046   ALKPHOS 89 05/24/2011 1046   BILITOT 0.3 05/24/2011 1046   GFRNONAA >90 08/17/2011 0422   GFRAA >90 08/17/2011 0422    CBG (last 3)   Basename 08/17/11 0807 08/16/11 2133 08/16/11 1740  GLUCAP 163* 172* 190*    Intake/Output Summary (Last 24 hours) at 08/17/11 1233 Last data filed at 08/17/11 0917  Gross per 24 hour  Intake 2733.75 ml  Output   1275 ml  Net 1458.75 ml    Diet Order: Heart Healthy; consumed 25% of lunch and  supper yesterday.  IVF:    DISCONTD: 0.9 % NaCl with KCl 20 mEq / L Last Rate: 75 mL/hr at 08/17/11 0600  DISCONTD: DOPamine     Estimated Nutritional Needs:   Kcal: 1650-1850 Protein: 100-110 grams Fluid: 1.8-2 liters  NUTRITION DIAGNOSIS: -Inadequate oral intake (NI-2.1).  Status: Ongoing  RELATED TO: poor appetite  AS EVIDENCED BY: 25% meal completion  MONITORING/EVALUATION(Goals): Goal:  Intake of meals and supplements to meet nutrition needs to support healing. Monitor:  Adequacy of oral intake, labs, weight trend.  EDUCATION NEEDS: -Education not appropriate at this time  INTERVENTION: Glucerna Shake PO TID to maximize oral intake.  Dietitian #:  [phone]  DOCUMENTATION CODES Per approved criteria  -Not Applicable    Hettie Holstein 08/17/2011, 12:18 PM

## 2011-08-17 NOTE — Progress Notes (Addendum)
[facility] Progress Note  08/17/2011 7:36 AM POD 2  Subjective:  Sleeping but arouses to voice.  No complaints.  Tm 99.8 now 98.9 96% RA   HR 100s reg Filed Vitals:   08/17/11 0400  BP: 132/40  Pulse:   Temp: 98.9 F (37.2 C)  Resp:     Physical Exam: Incisions:  Right groin with wound vac in place. Right chest incision-bandage is in tact and clean without drainage. Extremities:  Right foot the color is much improved from yesterday and warmer.  There is a very faint doppler signal in the DP and PT.  Still no motor or sensory function of right foot. There is no evidence of compartment syndrome as her calves are soft bilaterally and she has no pain with palpation.  CBC    Component Value Date/Time   WBC 9.2 08/17/2011 0422   RBC 3.62* 08/17/2011 0422   HGB 8.1* 08/17/2011 0422   HCT 26.6* 08/17/2011 0422   PLT 301 08/17/2011 0422   MCV 73.5* 08/17/2011 0422   MCH 22.4* 08/17/2011 0422   MCHC 30.5 08/17/2011 0422   RDW 20.3* 08/17/2011 0422   LYMPHSABS 1.8 08/15/2011 1434   MONOABS 1.0 08/15/2011 1434   EOSABS 0.1 08/15/2011 1434   BASOSABS 0.1 08/15/2011 1434    BMET    Component Value Date/Time   NA 135 08/17/2011 0422   K 3.1* 08/17/2011 0422   CL 100 08/17/2011 0422   CO2 26 08/17/2011 0422   GLUCOSE 168* 08/17/2011 0422   BUN 11 08/17/2011 0422   CREATININE 0.36* 08/17/2011 0422   CREATININE 0.58 05/24/2011 1046   CALCIUM 8.7 08/17/2011 0422   GFRNONAA >90 08/17/2011 0422   GFRAA >90 08/17/2011 0422    INR    Component Value Date/Time   INR 0.98 08/15/2011 1434     Intake/Output Summary (Last 24 hours) at 08/17/11 0736 Last data filed at 08/17/11 0400  Gross per 24 hour  Intake 1603.75 ml  Output   1375 ml  Net 228.75 ml     Assessment/Plan:  78 y.o. female is s/p Redo R common femoral exposure, Right Axillary to Femoral bypass with 8mm ringed PTFE POD 2 -BUN/Cr normal with good UOP -right foot warmer and color better.  Faint DP/PT signal in the  right, but sensory and motor are not in tact. -PT consult -hypokalemia--supplement -ck labs in am -acute surgical blood loss anemia--tolerating -? Transfer to telemetry.  Will d/w MD. Discussed with Dr. Myra Gianotti and will leave pt in stepdown. -will remove right chest bandage later (pt sleeping with head down and mouth is right at incision, so continue to keep covered at this time).  Bandage now off and incision has a little erythema, but looks okay. -? Remove VAC tomorrow-will d/w MD. -d/c IVF  Newton Pigg, PA-C [facility] [phone] 08/17/2011 7:36 AM  Foley Catheter was left in an additional day to for strict UOP

## 2011-08-17 NOTE — Consult Note (Signed)
[facility]  Interim history: 78 year old female patient admitted on 08/15/2011 for skin at right lower extremity. Angiogram demonstrated occluded iliac arteries. Patient subsequently has undergone a redo right common femoral exposure with right axillary to femoral bypass graft with 8 mm ring PTFE. Intraoperatively it is noted that an aortic thrombus was seen. Postoperatively she has been stable and as of 08/17/2011 has regained a pulse in the right lower extremity. [facility] have been asked to see this patient in consultation for management of her chronic medical problems.  Subjective: Awake and endorses anorexia/poor appetite. States surgical site pain markedly improved. Denies chest pain or shortness of breath.  Objective: Vital signs in last 24 hours: Temp:  [98.9 F (37.2 C)-99.8 F (37.7 C)] 99.5 F (37.5 C) (02/14 0726) Pulse Rate:  [105] 105  (02/14 0726) Resp:  [21] 21  (02/14 0726) BP: (132-153)/(37-40) 153/37 mmHg (02/14 0726) SpO2:  [96 %-98 %] 97 % (02/14 0726) Weight change:     Intake/Output from previous day: 02/13 0701 - 02/14 0700 In: 1753.8 [P.O.:600; I.V.:1003.8; IV Piggyback:150] Out: 1375 [Urine:1375] Intake/Output this shift: Total I/O In: 1290 [P.O.:240; I.V.:1050] Out: -   General appearance: alert, cooperative, appears stated age and no distress Resp: clear to auscultation bilaterally, room air with saturations 97% Cardio: regular rate and rhythm, S1, S2 normal, no murmur, click, rub or gallop, distal right lower extremity pulse present via Doppler GI: soft, non-tender; bowel sounds normal; no masses,  no organomegaly Extremities: extremities normal, atraumatic, no cyanosis or edema Neurologic: Grossly normal Skin: Right anterior chest incision dry and intact with expected ecchymosis and mild redness without drainage or  Lab Results:  Basename 08/17/11 0422 08/16/11 0847  WBC 9.2 5.6  HGB 8.1* 8.1*  HCT 26.6* 26.5*  PLT 301 320     BMET  Basename 08/17/11 0422 08/16/11 0847  NA 135 138  K 3.1* 3.7  CL 100 105  CO2 26 23  GLUCOSE 168* 171*  BUN 11 24*  CREATININE 0.36* 0.37*  CALCIUM 8.7 8.5    Studies/Results: Ct Angio Ao+bifem W/cm &/or Wo/cm  08/15/2011  *RADIOLOGY REPORT*  Clinical Data: Bilateral leg pain, vascular insufficiency  CT ANGIOGRAPHY AOBIFEM WITHOUT AND WITH CONTRAST  Comparison: 07/01/2009  Findings: Abdominal aorta:  There is diffuse calcified atheromatous plaque proximally.  There is infrarenal occlusion, which is new since the previous study.  There is an infrarenal aortobifemoral graft with both limbs occluded.  Celiac axis:  Calcified origin plaque resulting at least mild stenosis, patent distally  Superior mesenteric artery:  Patchy calcified plaque in its proximal aspect without high-grade stenosis, patent distally with classic distal branching  Left renal artery:  Duplicated, inferior moiety dominant, widely patent  Right renal artery:  Single, widely patent  Inferior mesenteric artery:  Occluded proximally, reconstituted distally by visceral collaterals  Left iliac system:  There is collateral reconstitution of the external and internal iliac flow with scattered atheromatous plaque, no aneurysm or high-grade stenosis.  Right iliac system:  There is collateral reconstitution of external and internal iliac flow without aneurysm or high-grade stenosis  Left lower extremity:  Occluded graft to the common femoral artery. The common femoral artery is patent.  There is fairly heavily calcified atheromatous plaque in the proximal SFA, with tandem high- grade stenoses or occlusions in its proximal and midsegment.  There is     segmental reconstitution of the popliteal artery.  There is proximal posterior tibial reconstitution, and this vessel does appear contiguous across the ankle  as the primary blood supply to the foot.  Right lower extremity:  Graft to the common femoral artery is thrombosed.  The common  femoral artery is patent.  There is extensive calcified plaque in the proximal SFA with tandem high- grade stenoses or short segment occlusions in its mid and distal portion, as well as segmental occlusion of the proximal popliteal artery.  There is collateral reconstitution of proximal trifurcation runoff but there is inadequate contrast below the mid calf to assess distal runoff completely.  Venous phase:  Not obtained  Nonvascular findings:  Visualized lung bases clear.  Previous cholecystectomy with stable central intrahepatic biliary ductal dilatation and dilated common bile duct.  Unremarkable arterial phase evaluation of the liver, spleen, adrenal glands, pancreas. Tiny bilateral renal cysts.  The renal parenchymal enhancement is normal otherwise.  Pancreas unremarkable.  Portal vein patent. Small bowel and colon decompressed.  Scattered sigmoid diverticula. Right inguinal hernia containing only mesenteric fat.  No ascites. No free air.  Left hip arthroplasty hardware results in streak artifact degrading portions of the scan.  Postoperative and degenerative changes in the right ankle.  IMPRESSION: 1.  Complete thrombosis of the aortobifemoral graft.  The right limb occlusion is new since the 2010 scan. 2.  Diffuse right femoropopliteal occlusive disease with incomplete evaluation of distal trifurcation runoff as above. 3.  Diffuse left femoropopliteal disease with reconstitution of contiguous posterior tibial runoff. 4.  Right inguinal hernia containing only mesenteric fat. 5.  Scattered sigmoid diverticula.  I telephoned the critical test results to Dr. Myra Gianotti at the time of interpretation.  Original Report Authenticated By: Osa Craver, M.D.   Dg Chest Port 1 View  08/15/2011  *RADIOLOGY REPORT*  Clinical Data: 78 year old female preoperative study for right leg amputation.  Diabetes and hypertension.  PORTABLE CHEST - 1 VIEW  Comparison: 07/01/2009 and earlier.  Findings: AP portable semi  upright view 1450 hours.  Stable hiatal hernia.  Stable cardiac size and mediastinal contours.  Visualized tracheal air column is within normal limits.  The lungs are clear. No pneumothorax or effusion.  IMPRESSION: No acute cardiopulmonary abnormality.  Hiatal hernia.  Original Report Authenticated By: Harley Hallmark, M.D.    Medications:  I have reviewed the patient's current medications. Scheduled:   . cefUROXime (ZINACEF)  IV  1.5 g Intravenous Q12H  . docusate sodium  100 mg Oral Daily  . donepezil  5 mg Oral QHS  . famotidine (PEPCID) IV  20 mg Intravenous Q12H  . hydrochlorothiazide  25 mg Oral Daily  . insulin aspart  0-9 Units Subcutaneous TID WC  . metoprolol  50 mg Oral BID  . nitroGLYCERIN  0.3 mg Transdermal Daily  . potassium chloride  20 mEq Oral BID WC  . potassium chloride  40 mEq Oral Once  . sertraline  50 mg Oral QHS  . simvastatin  40 mg Oral QHS    Assessment/Plan:  Principal Problem:  *PVD (peripheral vascular disease) *Per attending physician team vascular surgery *Disposition and advancement immobilization per attending team  Active Problems:  DM (diabetes mellitus) *CBGs well-controlled between 160 and 190 range *Given underlying history of CAD would like to avoid too tight glycemic the control  *Continue sliding scale insulin 3 times a day with meals   Postoperative anemia due to acute blood loss/chronic anemia *Baseline hemoglobin in November 2012 was around 12 and immediately preoperative hemoglobin was 10 *Current hemoglobin is 8.1. *Consider transfusion of packed red blood cells if hemoglobin drops below  7.5 given history of underlying CAD  Abnormal TSH *TSH checked this admission is low at 0.329 and likely an acute reactant related to sick euthyroid syndrome *Will check free T4 and T3 levels   HTN (hypertension) *Continue metoprolol and hydrochlorothiazide   Hypokalemia *Likely due to thiazide diuretic *Currently on twice a day potassium  replacement but will give an additional dose today *Repeat electrolyte panel in the morning   GERD (gastroesophageal reflux disease) *Asymptomatic, continue famotidine   CAD (coronary artery disease) *Continue nitroglycerin patch and statin     LOS: 2 days   Junious Silk, ANP pager [phone]  [facility] 8 [email or web address] Password: [email or web address]  08/17/2011, 11:41 AM  I have examined the patient, reviewed the chart and discussed the plan with Junious Silk, NP. I agree with the above note.   Calvert Cantor, MD [phone]

## 2011-08-18 LAB — BASIC METABOLIC PANEL WITH GFR
BUN: 10 mg/dL (ref 6–23)
CO2: 28 meq/L (ref 19–32)
Calcium: 9.2 mg/dL (ref 8.4–10.5)
Chloride: 96 meq/L (ref 96–112)
Creatinine, Ser: 0.32 mg/dL — ABNORMAL LOW (ref 0.50–1.10)
GFR calc Af Amer: >90 mL/min
GFR calc non Af Amer: >90 mL/min
Glucose, Bld: 165 mg/dL — ABNORMAL HIGH (ref 70–99)
Potassium: 3.9 meq/L (ref 3.5–5.1)
Sodium: 131 meq/L — ABNORMAL LOW (ref 135–145)

## 2011-08-18 LAB — CBC
HCT: 27.6 % — ABNORMAL LOW (ref 36.0–46.0)
Hemoglobin: 8.3 g/dL — ABNORMAL LOW (ref 12.0–15.0)
MCH: 22.3 pg — ABNORMAL LOW (ref 26.0–34.0)
MCHC: 30.1 g/dL (ref 30.0–36.0)
MCV: 74.2 fL — ABNORMAL LOW (ref 78.0–100.0)
Platelets: 337 10*3/uL (ref 150–400)
RBC: 3.72 MIL/uL — ABNORMAL LOW (ref 3.87–5.11)
RDW: 20 % — ABNORMAL HIGH (ref 11.5–15.5)
WBC: 11.7 10*3/uL — ABNORMAL HIGH (ref 4.0–10.5)

## 2011-08-18 LAB — GLUCOSE, CAPILLARY: Glucose-Capillary: 228 mg/dL — ABNORMAL HIGH (ref 70–99)

## 2011-08-18 LAB — T4, FREE: Free T4: 1.41 ng/dL (ref 0.80–1.80)

## 2011-08-18 MED ORDER — POLYETHYLENE GLYCOL 3350 17 G PO PACK
17.0000 g | PACK | Freq: Every day | ORAL | Status: DC
  Administered 2011-08-18 – 2011-08-21 (×2): 17 g via ORAL
  Filled 2011-08-18 (×4): qty 1

## 2011-08-18 MED ORDER — FLEET ENEMA 7-19 GM/118ML RE ENEM
1.0000 | ENEMA | Freq: Every day | RECTAL | Status: DC | PRN
  Filled 2011-08-18: qty 1

## 2011-08-18 MED ORDER — OXYCODONE HCL 5 MG PO CAPS: 5.0000 mg | ORAL_CAPSULE | ORAL | Status: DC | PRN

## 2011-08-18 NOTE — Progress Notes (Signed)
Pt report called to 2000 RN,  VSS, pulses in R foot are dopplerable, still without motor or sensory function. all meds are up to current time. Family aware of transfer and plans for d/c later.

## 2011-08-18 NOTE — Evaluation (Signed)
Physical Therapy Evaluation Patient Details Name: Mackenzie [name] MRN: [id number] DOB: 1928/03/09 Today's Date: 08/18/2011  Problem List:  Patient Active Problem List  Diagnoses  . THYROID NODULE  . DIABETES MELLITUS II, UNCOMPLICATED  . MACULAR DEGENERATION  . HYPERTENSION, BENIGN SYSTEMIC  . PSORIASIS  . WALKING DIFFICULTY  . INCONTINENCE, MIXED, URGE/STRESS  . Osteoporosis, senile  . Depression  . Dementia  . Wrist pain  . Postoperative anemia due to acute blood loss  . DM (diabetes mellitus)  . PVD (peripheral vascular disease)  . HTN (hypertension)  . GERD (gastroesophageal reflux disease)  . CAD (coronary artery disease)  . Hypokalemia  . Abnormal TSH    Past Medical History:  Past Medical History  Diagnosis Date  . Delirium 12/10/08    hospitalized  . Pelvic fracture 07/01/09    hospitalized  . Coronary artery disease   . Femoral neck fracture   . Anemia   . Hypertension   . Diabetes mellitus    Past Surgical History:  Past Surgical History  Procedure Date  . Cholecystectomy   . Cataract extraction   . Axillary-femoral bypass graft 08/15/2011    Procedure: BYPASS GRAFT AXILLA-BIFEMORAL;  Surgeon: [name], MD;  Location: [facility] OR;  Service: Vascular;  Laterality: Right;  Embolectomy of Right Femoral Artery with Right  Axilla Femoral Bypass Graft.    PT Assessment/Plan/Recommendation Clinical Impression Statement: Pt admitted with Rt leg ischemia and underwent Right Axillary to Femoral bypass.  Upon evaluation, noted Lt hip area staples and found mention in MD office note of 07/31/11 that pt had Lt hip repair at [facility] (note left for MD to assist with clarifying weight-bearing status). Noted on pelvic CT that pt had Lt hip hemiarthroplasty and order sent for MD co-sign for posterior hip precautions.  Pt will benefit from  PT to maximize strength and mobility once weight-bearing status clarified. PT Recommendation/Assessment: Patient will  need skilled PT in the acute care venue PT Problem List: Decreased strength;Decreased range of motion;Decreased activity tolerance;Decreased mobility;Decreased cognition;Decreased knowledge of use of DME;Decreased knowledge of precautions;Cardiopulmonary status limiting activity;Impaired sensation;Pain Barriers to Discharge: None Barriers to Discharge Comments: plan return to SNF PT Therapy Diagnosis : Difficulty walking;Acute pain;Generalized weakness PT Frequency: Min 3X/week PT Treatment/Interventions: DME instruction;Gait training;Functional mobility training;Therapeutic activities;Therapeutic exercise;Cognitive remediation;Patient/family education Follow Up Recommendations: Skilled nursing facility Equipment Recommended: Defer to next venue PT Goals  Acute Rehab PT Goals PT Goal Formulation: Patient unable to participate in goal setting Time For Goal Achievement: 2 weeks Pt will go Supine/Side to Sit: with mod assist;with HOB 0 degrees;with rail PT Goal: Supine/Side to Sit - Progress: Goal set today Pt will Sit at [facility] of Bed: with supervision;with bilateral upper extremity support;1-2 min PT Goal: Sit at Edge Of Bed - Progress: Goal set today Pt will go Sit to Supine/Side: with min assist;with HOB 0 degrees;with rail PT Goal: Sit to Supine/Side - Progress: Goal set today Pt will go Sit to Stand: with +2 total assist;from elevated surface;with upper extremity assist (pt=70%) PT Goal: Sit to Stand - Progress: Goal set today Pt will go Stand to Sit: with +2 total assist;with upper extremity assist (pt=70%) PT Goal: Stand to Sit - Progress: Goal set today Pt will Transfer Bed to Chair/Chair to Bed: with +2 total assist (pt=70%) PT Transfer Goal: Bed to Chair/Chair to Bed - Progress: Goal set today Pt will Perform Home Exercise Program: with min assist PT Goal: Perform Home Exercise Program - Progress: Goal set  today Additional Goals Additional Goal #1: Pt will verbalize hip precautions  with questioning cues  PT Goal: Additional Goal #1 - Progress: Goal set today  PT Evaluation Precautions/Restrictions  Precautions Precautions: Posterior Hip Precaution Comments: Noted pt has Lt hip hemiarthroplasty on CT of pelvis Required Braces or Orthoses: No Restrictions Weight Bearing Restrictions: Yes LLE Weight Bearing:  (unknown; left note for MD re: need for ortho input) Prior Functioning  Home Living Lives With: Other (Comment) Receives Help From: Personal care attendant Type of Home: Skilled Nursing Facility Additional Comments: Pt with h/o dementia and states "I don't know" to most questions Prior Function Comments: Pt with h/o dementia and states "I don't know" to most questions Cognition Cognition Arousal/Alertness: Awake/alert Overall Cognitive Status: History of cognitive impairments Orientation Level: Oriented to person;Oriented to situation;Oriented to place Sensation/Coordination Sensation Light Touch: Impaired by gross assessment (impaired Rt LE) Extremity Assessment RLE Assessment RLE Assessment: Exceptions to [facility] RLE PROM (degrees) RLE Overall PROM Comments: pt with no AROM of Rt foot/Ankle (PROM dorsiflexion to -20 degrees with pain in calf); AAROM hip and knee to 90 degrees (pt with VAC in Rt groin) RLE Strength RLE Overall Strength Comments: ankle/toes 0/5; knee extension 3+/5 LLE Assessment LLE Assessment: Exceptions to WFL LLE AROM (degrees) LLE Overall AROM Comments: ankle dorsiflexion -20 degrees due to heel cord tightness; knee and hip WFL LLE Strength LLE Overall Strength Comments: grossly 3+/5 throughout Mobility (including Balance) Bed Mobility Bed Mobility: Yes Rolling Right: 1: +2 Total assist;Patient percentage (comment);With rail Rolling Right Details (indicate cue type and reason): pt=40%; upon rolling on first trial, noted staples across Lt hip area.  RN contacted and confirmed recent hiip surgery to due hip fracture (although not  noted in H&P); discussed with RN need for hip precautions and ? weight bearing status until clarified by MD; on second trial of rolling to Rt, utilized pillow between knees Right Sidelying to Sit: 1: +2 Total assist;Patient percentage (comment);With rails;HOB elevated (comment degrees) Right Sidelying to Sit Details (indicate cue type and reason): HOB 20; pt=30%; pt able to assist with moving legs over EOB, initiated pushing up onto her Rt elbow; required assist to complete movements due to weakness Sit to Sidelying Right: 1: +2 Total assist;Patient percentage (comment);HOB flat Sit to Sidelying Right Details (indicate cue type and reason): pt=40% Scooting to HOB: 1: +2 Total assist;Patient percentage (comment) Scooting to [facility] Details (indicate cue type and reason): pt=0% Transfers Transfers: No (HR incr to 132 at EOB and wt-bearing status unknown) Ambulation/Gait Ambulation/Gait: No    Exercise    End of Session PT - End of Session Activity Tolerance: Treatment limited secondary to medical complications (Comment);Patient limited by fatigue;Patient limited by pain (HR incr to 132 at EOB) Patient left: in bed;with bed alarm set;with call bell in reach Nurse Communication: Other (comment) (need for posterior hip precautions; need for WB status) General Behavior During Session: [facility] for tasks performed Cognition: Impaired (poor memory; h/o dementia per chart)  Baily Serpe 08/18/2011, 10:31 AM Pager [phone]

## 2011-08-18 NOTE — Progress Notes (Signed)
[facility] [place]  Subjective  - POD #3  No complaints this morning.  No pain   Physical Exam:  Monophasic R DP doppler signal, much improved Calf is soft No motor or sensory function       Assessment/Plan:  POD #3  Pt remains stable.  Her foot looks dramatically better.  Unfortunately she has no function of her foot, which is a result of the duration of ischemia.  I do not think this will improve.  I discussed this with her power of attorney.    She will be transferred to the floor today with plans to return to SNF either today or monday  Jubilee Vivero IV, V. WELLS 08/18/2011 11:51 AM --  Filed Vitals:   08/18/11 0925  BP:   Pulse: 132  Temp:   Resp:     Intake/Output Summary (Last 24 hours) at 08/18/11 1151 Last data filed at 08/17/11 1700  Gross per 24 hour  Intake    120 ml  Output    200 ml  Net    -80 ml     Laboratory CBC    Component Value Date/Time   WBC 11.7* 08/18/2011 0500   HGB 8.3* 08/18/2011 0500   HCT 27.6* 08/18/2011 0500   PLT 337 08/18/2011 0500    BMET    Component Value Date/Time   NA 131* 08/18/2011 0500   K 3.9 08/18/2011 0500   CL 96 08/18/2011 0500   CO2 28 08/18/2011 0500   GLUCOSE 165* 08/18/2011 0500   BUN 10 08/18/2011 0500   CREATININE 0.32* 08/18/2011 0500   CREATININE 0.58 05/24/2011 1046   CALCIUM 9.2 08/18/2011 0500   GFRNONAA >90 08/18/2011 0500   GFRAA >90 08/18/2011 0500    COAG Lab Results  Component Value Date   INR 0.98 08/15/2011   INR 1.03 07/01/2009   No results found for this basename: PTT    Antibiotics Anti-infectives     Start     Dose/Rate Route Frequency Ordered Stop   08/16/11 0800   cefUROXime (ZINACEF) 1.5 g in dextrose 5 % 50 mL IVPB        1.5 g 100 mL/hr over 30 Minutes Intravenous Every 12 hours 08/16/11 0212 08/16/11 2030   08/16/11 0600   cefUROXime (ZINACEF) 1.5 g in dextrose 5 % 50 mL IVPB        1.5 g 100 mL/hr over 30 Minutes Intravenous On call to O.R. 08/15/11  1930 08/15/11 2038           V. Charlena Cross, M.D. [facility] [place] Office: [phone] Pager:  [phone]

## 2011-08-18 NOTE — Consult Note (Signed)
[facility]  Interim history: 78 year old female patient admitted on 08/15/2011 for skin at right lower extremity. Angiogram demonstrated occluded iliac arteries. Patient subsequently has undergone a redo right common femoral exposure with right axillary to femoral bypass graft with 8 mm ring PTFE. Intraoperatively it is noted that an aortic thrombus was seen. Postoperatively she has been stable and as of 08/17/2011 has regained a pulse in the right lower extremity. [facility] have been asked to see this patient in consultation for management of her chronic medical problems.  Subjective: Awake and endorses anorexia/poor appetite. States surgical site pain markedly improved. Denies chest pain or shortness of breath. He endorses heel soreness  Objective: Vital signs in last 24 hours: Temp:  [97.7 F (36.5 C)-99.3 F (37.4 C)] 98.5 F (36.9 C) (02/15 0800) Pulse Rate:  [92-132] 132  (02/15 0925) Resp:  [24-29] 29  (02/14 1535) BP: (140-159)/(36-47) 157/44 mmHg (02/15 0800) SpO2:  [95 %-98 %] 96 % (02/15 0925) Weight change:  Last BM Date: 08/18/11  Intake/Output from previous day: 02/14 0701 - 02/15 0700 In: 1460 [P.O.:360; I.V.:1050; IV Piggyback:50] Out: 375 [Urine:375] Intake/Output this shift:    General appearance: alert, cooperative, appears stated age and no distress Resp: clear to auscultation bilaterally, room air with saturations 97% Cardio: regular rate and rhythm, S1, S2 normal, no murmur, click, rub or gallop, distal right lower extremity pulse present via Doppler GI: soft, non-tender; bowel sounds normal; no masses,  no organomegaly Extremities: extremities normal, atraumatic, no cyanosis or edema Neurologic: Grossly normal Skin: Right anterior chest incision dry and intact with expected ecchymosis and mild redness without drainage or  Lab Results:  Basename 08/18/11 0500 08/17/11 0422  WBC 11.7* 9.2  HGB 8.3* 8.1*  HCT 27.6* 26.6*  PLT 337 301    BMET  Basename 08/18/11 0500 08/17/11 0422  NA 131* 135  K 3.9 3.1*  CL 96 100  CO2 28 26  GLUCOSE 165* 168*  BUN 10 11  CREATININE 0.32* 0.36*  CALCIUM 9.2 8.7    Studies/Results: No results found.  Medications:  I have reviewed the patient's current medications. Scheduled:    . docusate sodium  100 mg Oral Daily  . donepezil  5 mg Oral QHS  . famotidine  20 mg Oral BID  . feeding supplement  237 mL Oral TID BM  . hydrochlorothiazide  25 mg Oral Daily  . influenza  inactive virus vaccine  0.5 mL Intramuscular Tomorrow-1000  . insulin aspart  0-9 Units Subcutaneous TID WC  . metoprolol  50 mg Oral BID  . nitroGLYCERIN  0.3 mg Transdermal Daily  . polyethylene glycol  17 g Oral Daily  . potassium chloride  20 mEq Oral BID WC  . potassium chloride  40 mEq Oral Once  . sertraline  50 mg Oral QHS  . simvastatin  40 mg Oral QHS  . DISCONTD: famotidine (PEPCID) IV  20 mg Intravenous Q12H    Assessment/Plan:  Principal Problem:  *PVD (peripheral vascular disease) *Per attending physician team vascular surgery *Disposition and advancement immobilization per attending team-plans are to transfer patient to unit 2000 today *Endorses he'll soreness with redness - have ordered to keep heels elevated in off the bed  Active Problems:  DM (diabetes mellitus) *CBGs adequately controlled with one peak at 234 yesterday evening around 9 PM *Given underlying history of CAD would like to avoid too tight glycemic the control  *Continue sliding scale insulin 3 times a day with meals *Home Glucophage on  hold, he CBGs more persistently greater than 200 consider adding low-dose Lantus while hospitalized   Postoperative anemia due to acute blood loss/chronic anemia *Baseline hemoglobin in November 2012 was around 12 and immediately preoperative hemoglobin was 10 *Current hemoglobin is 8.3. *Consider transfusion of packed red blood cells if hemoglobin drops below 7.5 given history of  underlying CAD  Abnormal TSH *TSH checked this admission is low at 0.329 and likely an acute reactant related to sick euthyroid syndrome   HTN (hypertension) *Continue metoprolol and hydrochlorothiazide   Hypokalemia *Likely due to thiazide diuretic *Currently on twice a day potassium replacement  *Potassium increased to 3.9 after extra doses on 08/17/2011   GERD (gastroesophageal reflux disease) *Asymptomatic, continue famotidine   CAD (coronary artery disease) *Continue nitroglycerin patch and statin     LOS: 3 days   Junious Silk, ANP pager [phone]  [facility] 8 [email or web address] Password: [email or web address]  08/18/2011, 11:29 AM  I have examined the patient, reviewed the chart and discussed the plan with Junious Silk, NP. I agree with the above note.   Calvert Cantor, MD [phone]

## 2011-08-19 LAB — GLUCOSE, CAPILLARY: Glucose-Capillary: 188 mg/dL — ABNORMAL HIGH (ref 70–99)

## 2011-08-19 NOTE — Progress Notes (Signed)
[facility] [place]  Subjective  - POD #4  No complaints.  Still cant move or feel foot   Physical Exam:  Monophasic doppler signals in R LE Foot warm Palpable pulse within Ax-fem graft  No motor or sensory function in right leg     Assessment/Plan:  POD #4  con't with ambulation D/c to SNF  Early next week Do not expect functional recovery of leg, however it is well [perfused currently  Mackenzie [name] IV, V. WELLS 08/19/2011 8:17 AM --  Filed Vitals:   08/19/11 0428  BP: 115/65  Pulse: 108  Temp: 98.9 F (37.2 C)  Resp: 20    Intake/Output Summary (Last 24 hours) at 08/19/11 0817 Last data filed at 08/18/11 1700  Gross per 24 hour  Intake    120 ml  Output      0 ml  Net    120 ml     Laboratory CBC    Component Value Date/Time   WBC 11.7* 08/18/2011 0500   HGB 8.3* 08/18/2011 0500   HCT 27.6* 08/18/2011 0500   PLT 337 08/18/2011 0500    BMET    Component Value Date/Time   NA 131* 08/18/2011 0500   K 3.9 08/18/2011 0500   CL 96 08/18/2011 0500   CO2 28 08/18/2011 0500   GLUCOSE 165* 08/18/2011 0500   BUN 10 08/18/2011 0500   CREATININE 0.32* 08/18/2011 0500   CREATININE 0.58 05/24/2011 1046   CALCIUM 9.2 08/18/2011 0500   GFRNONAA >90 08/18/2011 0500   GFRAA >90 08/18/2011 0500    COAG Lab Results  Component Value Date   INR 0.98 08/15/2011   INR 1.03 07/01/2009   No results found for this basename: PTT    Antibiotics Anti-infectives     Start     Dose/Rate Route Frequency Ordered Stop   08/16/11 0800   cefUROXime (ZINACEF) 1.5 g in dextrose 5 % 50 mL IVPB        1.5 g 100 mL/hr over 30 Minutes Intravenous Every 12 hours 08/16/11 0212 08/16/11 2030   08/16/11 0600   cefUROXime (ZINACEF) 1.5 g in dextrose 5 % 50 mL IVPB        1.5 g 100 mL/hr over 30 Minutes Intravenous On call to O.R. 08/15/11 1930 08/15/11 2038           V. Charlena Cross, M.D. [facility] [place] Office:  [phone] Pager:  [phone]

## 2011-08-19 NOTE — Consult Note (Signed)
Subjective: Awake, foor pain improved. No CP/SOB.  Objective: Vital signs in last 24 hours: Temp:  [98 F (36.7 C)-99.5 F (37.5 C)] 98.9 F (37.2 C) (02/16 0428) Pulse Rate:  [98-108] 108  (02/16 0428) Resp:  [20] 20  (02/16 0428) BP: (115-139)/(65-76) 115/65 mmHg (02/16 0428) SpO2:  [95 %-97 %] 96 % (02/16 0428) Weight change:  Last BM Date: 08/19/11  Intake/Output from previous day: 02/15 0701 - 02/16 0700 In: 120 [P.O.:120] Out: -  Total I/O In: 120 [P.O.:120] Out: -    Physical Exam: General: Alert, awake, oriented x3, in no acute distress. HEENT: No bruits, no goiter. Heart: Regular rate and rhythm, without murmurs, rubs, gallops. Lungs: Clear to auscultation bilaterally. Abdomen: Soft, nontender, nondistended, positive bowel sounds. Extremities: No clubbing cyanosis with positive pedal pulses. Neuro: Grossly intact, nonfocal.    Lab Results: Basic Metabolic Panel:  Basename 08/18/11 0500 08/17/11 0422  NA 131* 135  K 3.9 3.1*  CL 96 100  CO2 28 26  GLUCOSE 165* 168*  BUN 10 11  CREATININE 0.32* 0.36*  CALCIUM 9.2 8.7  MG -- --  PHOS -- --   CBC:  Basename 08/18/11 0500 08/17/11 0422  WBC 11.7* 9.2  NEUTROABS -- --  HGB 8.3* 8.1*  HCT 27.6* 26.6*  MCV 74.2* 73.5*  PLT 337 301   CBG:  Basename 08/19/11 1154 08/19/11 0643 08/18/11 2215 08/18/11 1618 08/18/11 1241 08/18/11 0744  GLUCAP 204* 189* 228* 130* 267* 168*   Fasting Lipid Panel:  Basename 08/17/11 0422  CHOL 102  HDL 35*  LDLCALC 51  TRIG 81  CHOLHDL 2.9  LDLDIRECT --   Thyroid Function Tests:  Basename 08/18/11 0500 08/17/11 0422  TSH -- 0.329*  T4TOTAL -- --  FREET4 1.41 --  T3FREE -- --  THYROIDAB -- --   Urine Drug Screen: Drugs of Abuse     Component Value Date/Time   LABOPIA NEGATIVE 12/06/2008 0645   COCAINSCRNUR NEGATIVE 12/06/2008 0645   LABBENZ NEGATIVE 12/06/2008 0645   AMPHETMU NEGATIVE 12/06/2008 0645     Recent Results (from the past 240 hour(s))    SURGICAL PCR SCREEN     Status: Normal   Collection Time   08/15/11  6:31 PM      Component Value Range Status Comment   MRSA, PCR NEGATIVE  NEGATIVE  Final    Staphylococcus aureus NEGATIVE  NEGATIVE  Final     Studies/Results: No results found.  Medications: Scheduled Meds:   . docusate sodium  100 mg Oral Daily  . donepezil  5 mg Oral QHS  . famotidine  20 mg Oral BID  . feeding supplement  237 mL Oral TID BM  . hydrochlorothiazide  25 mg Oral Daily  . insulin aspart  0-9 Units Subcutaneous TID WC  . metoprolol  50 mg Oral BID  . nitroGLYCERIN  0.3 mg Transdermal Daily  . polyethylene glycol  17 g Oral Daily  . sertraline  50 mg Oral QHS  . simvastatin  40 mg Oral QHS   Continuous Infusions:  PRN Meds:.acetaminophen, acetaminophen, guaiFENesin-dextromethorphan, hydrALAZINE, labetalol, magnesium hydroxide, metoprolol, morphine injection, ondansetron (ZOFRAN) IV, ondansetron, oxyCODONE, phenol, sodium phosphate, temazepam, DISCONTD: droperidol, DISCONTD: HYDROmorphone  Assessment/Plan:  Principal Problem:  *PVD (peripheral vascular disease) Active Problems:  Postoperative anemia due to acute blood loss  DM (diabetes mellitus)  HTN (hypertension)  GERD (gastroesophageal reflux disease)  CAD (coronary artery disease)  Hypokalemia  Abnormal TSH  #1 PVD: Per vascular surgery. POD #4 right  ax-fem bypass.  #2 HTN: Well controlled.  #3 Anemia: Transfuse only if HB drops below 7.0.  #4 DM: Continue current regimen.  #5 Abnormal TSH: Likely 2/2 sick euthyroid syndrome. Recheck TSH in 4-6 weeks.  #6 CAD: Stable. No Chest Pain.  #7 Disposition: Per vascular, it sounds like plans are for DC SNF early next. Will sign off. Please call back with questions!    LOS: 4 days   HERNANDEZ ACOSTA,ESTELA [facility] Pager: [phone] 08/19/2011, 1:28 PM

## 2011-08-20 LAB — TYPE AND SCREEN: Unit division: 0

## 2011-08-20 LAB — CBC
Hemoglobin: 9 g/dL — ABNORMAL LOW (ref 12.0–15.0)
MCH: 22.7 pg — ABNORMAL LOW (ref 26.0–34.0)
MCV: 72.3 fL — ABNORMAL LOW (ref 78.0–100.0)
Platelets: 323 10*3/uL (ref 150–400)
RBC: 3.97 MIL/uL (ref 3.87–5.11)

## 2011-08-20 LAB — GLUCOSE, CAPILLARY: Glucose-Capillary: 187 mg/dL — ABNORMAL HIGH (ref 70–99)

## 2011-08-20 NOTE — Progress Notes (Signed)
Maximum assist to [facility] to have BM and void. BM was black...mentioned to MD during rounds. Will hemoccult to check for blood. MD also ordering labs for today, to include CBC. Will continue to monitor. Mackenzie [name]

## 2011-08-20 NOTE — Progress Notes (Signed)
[facility] [place]  Subjective  - POD #5  Black stool this am No pain today Can not wt bear on right leg   Physical Exam:  Right leg warm Can lift leg off of bed ? Slight sensation in foot this am       Assessment/Plan:    ? GIB.  Will heme check stool and send CBC Will need SNF Likely to be without function of right leg  Josilyn Shippee IV, V. WELLS 08/20/2011 9:18 AM --  Filed Vitals:   08/20/11 0425  BP: 143/79  Pulse: 95  Temp: 97.9 F (36.6 C)  Resp: 18    Intake/Output Summary (Last 24 hours) at 08/20/11 0918 Last data filed at 08/20/11 1610  Gross per 24 hour  Intake    240 ml  Output      0 ml  Net    240 ml     Laboratory CBC    Component Value Date/Time   WBC 11.7* 08/18/2011 0500   HGB 8.3* 08/18/2011 0500   HCT 27.6* 08/18/2011 0500   PLT 337 08/18/2011 0500    BMET    Component Value Date/Time   NA 131* 08/18/2011 0500   K 3.9 08/18/2011 0500   CL 96 08/18/2011 0500   CO2 28 08/18/2011 0500   GLUCOSE 165* 08/18/2011 0500   BUN 10 08/18/2011 0500   CREATININE 0.32* 08/18/2011 0500   CREATININE 0.58 05/24/2011 1046   CALCIUM 9.2 08/18/2011 0500   GFRNONAA >90 08/18/2011 0500   GFRAA >90 08/18/2011 0500    COAG Lab Results  Component Value Date   INR 0.98 08/15/2011   INR 1.03 07/01/2009   No results found for this basename: PTT    Antibiotics Anti-infectives     Start     Dose/Rate Route Frequency Ordered Stop   08/16/11 0800   cefUROXime (ZINACEF) 1.5 g in dextrose 5 % 50 mL IVPB        1.5 g 100 mL/hr over 30 Minutes Intravenous Every 12 hours 08/16/11 0212 08/16/11 2030   08/16/11 0600   cefUROXime (ZINACEF) 1.5 g in dextrose 5 % 50 mL IVPB        1.5 g 100 mL/hr over 30 Minutes Intravenous On call to O.R. 08/15/11 1930 08/15/11 2038           V. Charlena Cross, M.D. [facility] [place] Office: [phone] Pager:  [phone]

## 2011-08-21 LAB — GLUCOSE, CAPILLARY
Glucose-Capillary: 162 mg/dL — ABNORMAL HIGH (ref 70–99)
Glucose-Capillary: 210 mg/dL — ABNORMAL HIGH (ref 70–99)

## 2011-08-21 LAB — CBC
Hemoglobin: 9.8 g/dL — ABNORMAL LOW (ref 12.0–15.0)
MCH: 22.3 pg — ABNORMAL LOW (ref 26.0–34.0)
MCV: 72.4 fL — ABNORMAL LOW (ref 78.0–100.0)
RBC: 4.39 MIL/uL (ref 3.87–5.11)

## 2011-08-21 LAB — BASIC METABOLIC PANEL
CO2: 28 mEq/L (ref 19–32)
Calcium: 9.4 mg/dL (ref 8.4–10.5)
Creatinine, Ser: 0.34 mg/dL — ABNORMAL LOW (ref 0.50–1.10)
Glucose, Bld: 172 mg/dL — ABNORMAL HIGH (ref 70–99)

## 2011-08-21 MED ORDER — OXYCODONE HCL 5 MG PO CAPS: 5.0000 mg | ORAL_CAPSULE | ORAL | Status: DC | PRN

## 2011-08-21 NOTE — Discharge Summary (Signed)
I agree with the above.  The patient presented with an ischemic right leg.  I was able to revascuarize her with a Ax-Fem bypass, however due to the length of ischemic time (2weeks) she has regained minimal use of her leg.    Durene Cal

## 2011-08-21 NOTE — Progress Notes (Signed)
Wound vac dcd as ordered by Newton Pigg PAC, dry dressign placed over incision with staples. Alexy Heldt, Randall An RN

## 2011-08-21 NOTE — Progress Notes (Signed)
Clinical Social Worker received consult for "returning to facility." Pt is ready for discharge to SNF. CSW met with pt to address consult. Pt shared that he is ready to leave the hospital. Pt reported that she is a resident of [facility]. Pt inquired about how she was going to return. CSW replied that pt will be transported by non-emergent ambulance. CSW contacted pt's niece (and HCPOA) about discharge plan, which niece is agreeable to pt returning to [facility]. CSW contacted facility and sent discharge information to facility. The facility is ready for pt to return. [facility] will provide transport to facility. CSW is signing off as no further social work needs identified.   Dede Query, MSW, Theresia Majors [phone]

## 2011-08-21 NOTE — Discharge Summary (Addendum)
[facility] Discharge Summary  Mackenzie [name] [birth year] 78 y.o. female  [id number]  Admission Date: 08/15/2011  Discharge Date: 08/21/11  Physician: [name], MD  Admission Diagnosis: rt lower leg pain Rt leg pain Cold leg   HPI:   This is a 78 y.o. female seen as a consult in the emergency department for concerns over an ischemic right leg. The patient was brought to the emergency department earlier today with complaints of right leg pain. She was in the emergency department yesterday with right hip pain. There is no mention of issues in her right leg. The patient states that she is been having problems with her right foot for over a week. She does walk however she has not walked in over a week because of her leg. She is unable to move her right foot. She is unable to feel her right foot. It is very painful. There are no relieving factors. The pain is constant.  The patient is in a nursing facility. She suffers from delivery him. Her atherosclerotic risk factors include a history of smoking. She is medically managed for her hypertension and diabetes.     Hospital Course:  The patient was admitted to the hospital and taken to the operating room on 08/15/2011 - 08/16/2011 and underwent Redo R common femoral exposure, Right Axillary to Femoral bypass with 8mm ringed PTFE.  The pt tolerated the procedure well and was transported to the PACU in good condition. By POD 1, her right foot was warmer and the color was improved.  She did have a faint DP/PT signal on the right, but her sensory and motor were not in tact nor did they improve through her hospitalization.  She did have acute surgical blood loss anemia.  Her calf remained soft and non tender throughout her stay.   By POD 5, she was able to lift her right leg off the bed, but she has no weight bearing on this leg.  She did have a black stool on POD 5.  Her fecal occult blood was negative and her hgb did improve by  the next day. The remainder of the hospital course consisted of PT and increasing intake of solids without difficulty.    Basename 08/21/11 0650  NA 132*  K 3.4*  CL 93*  CO2 28  GLUCOSE 172*  BUN 13  CALCIUM 9.4    Basename 08/21/11 0650 08/20/11 1038  WBC 9.6 8.9  HGB 9.8* 9.0*  HCT 31.8* 28.7*  PLT 374 323   No results found for this basename: INR:2 in the last 72 hours   Discharge Instructions:   The patient is discharged to home with extensive instructions on wound care and progressive ambulation.  They are instructed not to drive or perform any heavy lifting until returning to see the physician in his office.  Discharge Orders    Future Orders Please Complete By Expires   Resume previous diet      Call MD for:  temperature >100.5      Call MD for:  redness, tenderness, or signs of infection (pain, swelling, bleeding, redness, odor or green/yellow discharge around incision site)      Call MD for:  severe or increased pain, loss or decreased feeling  in affected limb(s)      may wash over wound with mild soap and water      Scheduling Instructions:   Shower daily with soap and water.   Resume previous diet  Call MD for:  temperature >100.5      Call MD for:  redness, tenderness, or signs of infection (pain, swelling, bleeding, redness, odor or green/yellow discharge around incision site)      Call MD for:  severe or increased pain, loss or decreased feeling  in affected limb(s)      Leave dressing on - Keep it clean, dry, and intact until clinic visit      Scheduling Instructions:   Keep right groin dry with 4x4 gauze and change daily.   may wash over wound with mild soap and water      Scheduling Instructions:   Shower daily with soap and water starting 08/21/11       Discharge Diagnosis:  rt lower leg pain Rt leg pain Cold leg  Secondary Diagnosis: Patient Active Problem List  Diagnoses  . THYROID NODULE  . DIABETES MELLITUS II, UNCOMPLICATED  .  MACULAR DEGENERATION  . HYPERTENSION, BENIGN SYSTEMIC  . PSORIASIS  . WALKING DIFFICULTY  . INCONTINENCE, MIXED, URGE/STRESS  . Osteoporosis, senile  . Depression  . Dementia  . Wrist pain  . Postoperative anemia due to acute blood loss  . DM (diabetes mellitus)  . PVD (peripheral vascular disease)  . HTN (hypertension)  . GERD (gastroesophageal reflux disease)  . CAD (coronary artery disease)  . Hypokalemia  . Abnormal TSH   Past Medical History  Diagnosis Date  . Delirium 12/10/08    hospitalized  . Pelvic fracture 07/01/09    hospitalized  . Coronary artery disease   . Femoral neck fracture   . Anemia   . Hypertension   . Diabetes mellitus        Mackenzie [name], Mackenzie [name]  Home Medication Instructions [id number]   Printed on:08/21/11 0959  Medication Information                    simvastatin (ZOCOR) 40 MG tablet Take 1 tablet (40 mg total) by mouth at bedtime.           metoprolol (LOPRESSOR) 50 MG tablet Take 1 tablet (50 mg total) by mouth 2 (two) times daily.           metFORMIN (GLUCOPHAGE) 1000 MG tablet Take 1 tablet (1,000 mg total) by mouth 2 (two) times daily with a meal.           hydrochlorothiazide (HYDRODIURIL) 25 MG tablet Take 1 tablet (25 mg total) by mouth daily.           nitroGLYCERIN (NITRODUR - DOSED IN MG/24 HR) 0.3 mg/hr Place 1 patch onto the skin daily.           donepezil (ARICEPT) 5 MG tablet Take 5 mg by mouth at bedtime.           sertraline (ZOLOFT) 50 MG tablet Take 50 mg by mouth at bedtime.           OVER THE COUNTER MEDICATION Take 1 capsule by mouth 2 (two) times daily. Vitamin B complex with iron intrinsic factor           magnesium hydroxide (MILK OF MAGNESIA) 400 MG/5ML suspension Take 30 mLs by mouth at bedtime as needed.           alum & mag hydroxide-simeth (MYLANTA DOUBLE-STRENGTH) 400-400-40 MG/5ML suspension Take 30 mLs by mouth every 6 (six) hours as needed. dyspepsia           bisacodyl (DULCOLAX) 10 MG  suppository Place 10 mg rectally as  needed. For constipation           insulin aspart (NOVOLOG) 100 UNIT/ML injection Inject 2-8 Units into the skin 4 (four) times daily - after meals and at bedtime.           enoxaparin (LOVENOX) 30 MG/0.3ML SOLN Inject 30 mg into the skin every 12 (twelve) hours. For 24 days           oxycodone (OXY-IR) 5 MG capsule Take 1 capsule (5 mg total) by mouth every 4 (four) hours as needed. For severe pain             Disposition: SNF  Patient's condition: is Fair  Follow up: 1. Dr. Myra Gianotti in one week.  At that time, we will remove her staples from her right groin.   Mackenzie Pigg, PA-C [facility] [phone] 08/21/2011  9:59 AM  The patient presented with an ischemic leg. Due to the length of time of her ischemic (greater than 2 weeks) she did not gain much function in her leg after axillary femoral bypass graft. She would discharge to skilled nursing facility today I will see her back in one week  Mackenzie [name]

## 2011-08-21 NOTE — Progress Notes (Signed)
[facility] Progress Note  08/21/2011 7:52 AM POD 6  Subjective:  No complaints this am.  Sitting up in bed eating.  Afebrile x 24hrs  97% RA Filed Vitals:   08/21/11 0343  BP: 123/84  Pulse: 97  Temp: 98.2 F (36.8 C)  Resp: 18    Physical Exam: Incisions:  Right groin with wound vac.  RIght chest incision is c/d/i. Erythema improved. Extremities:  Right foot warm.  No motor or sensory function of foot.  She does have feeling at level of the ankle on the right.  CBC    Component Value Date/Time   WBC 9.6 08/21/2011 0650   RBC 4.39 08/21/2011 0650   HGB 9.8* 08/21/2011 0650   HCT 31.8* 08/21/2011 0650   PLT 374 08/21/2011 0650   MCV 72.4* 08/21/2011 0650   MCH 22.3* 08/21/2011 0650   MCHC 30.8 08/21/2011 0650   RDW 19.0* 08/21/2011 0650   LYMPHSABS 1.8 08/15/2011 1434   MONOABS 1.0 08/15/2011 1434   EOSABS 0.1 08/15/2011 1434   BASOSABS 0.1 08/15/2011 1434    BMET    Component Value Date/Time   NA 131* 08/18/2011 0500   K 3.9 08/18/2011 0500   CL 96 08/18/2011 0500   CO2 28 08/18/2011 0500   GLUCOSE 165* 08/18/2011 0500   BUN 10 08/18/2011 0500   CREATININE 0.32* 08/18/2011 0500   CREATININE 0.58 05/24/2011 1046   CALCIUM 9.2 08/18/2011 0500   GFRNONAA >90 08/18/2011 0500   GFRAA >90 08/18/2011 0500   Fecal occult blood:  NEGATIVE  INR    Component Value Date/Time   INR 0.98 08/15/2011 1434     Intake/Output Summary (Last 24 hours) at 08/21/11 8295 Last data filed at 08/20/11 1723  Gross per 24 hour  Intake    360 ml  Output      0 ml  Net    360 ml     Assessment/Plan:  78 y.o. female is s/p Redo R common femoral exposure, Right Axillary to Femoral bypass with 8mm ringed PTFE  POD 6 -Hgb improved from yesterday and fecal occult blood is negative. -still right leg is non-functioning. -will need SNF -? Remove wound vac today    Newton Pigg, PA-C [facility] [phone] 08/21/2011 7:52 AM

## 2011-08-25 ENCOUNTER — Encounter: Payer: Self-pay | Admitting: Surgery

## 2011-08-26 NOTE — ED Provider Notes (Signed)
History     CSN: [id number]  Arrival date & time 08/14/11  1744   First MD Initiated Contact with Patient 08/14/11 2103      Chief Complaint  Patient presents with  . Hip Pain    (Consider location/radiation/quality/duration/timing/severity/associated sxs/prior treatment) HPI  78 year old female from [facility] via EMS. Reports having right hip pain lasting for several hours earlier today. No history of injury or trauma or fall. When EMS arrived to facility, patient was sleeping and denied any pain. History of dementia, so level 5 caveat may apply to accurate historical information.    Past Medical History  Diagnosis Date  . Delirium 12/10/08    hospitalized  . Pelvic fracture 07/01/09    hospitalized  . Coronary artery disease   . Femoral neck fracture   . Anemia   . Hypertension   . Diabetes mellitus     Past Surgical History  Procedure Date  . Cholecystectomy   . Cataract extraction   . Axillary-femoral bypass graft 08/15/2011    Procedure: BYPASS GRAFT AXILLA-BIFEMORAL;  Surgeon: [name], MD;  Location: [facility] OR;  Service: Vascular;  Laterality: Right;  Embolectomy of Right Femoral Artery with Right  Axilla Femoral Bypass Graft.    Family History  Problem Relation Age of Onset  . Heart failure Father     died age 55  . Stroke Mother     died age 27    History  Substance Use Topics  . Smoking status: Former Games developer  . Smokeless tobacco: Never Used  . Alcohol Use: No    OB History    Grav Para Term Preterm Abortions TAB SAB Ect Mult Living                  Review of Systems  Constitutional: Negative for fever and chills.  HENT: Negative for congestion and neck pain.   Eyes: Negative for redness.  Respiratory: Negative for cough and shortness of breath.   Cardiovascular: Negative for chest pain and leg swelling.  Gastrointestinal: Negative for nausea, vomiting and abdominal pain.  Genitourinary: Negative for dysuria.    Musculoskeletal: Negative for back pain.  Skin: Negative for rash.  Neurological: Negative for headaches.  Hematological: Does not bruise/bleed easily.  Psychiatric/Behavioral: Positive for confusion.  ROS Limited by some confusion elicited by the patient. Level 5 caveat pertains.   Allergies  Review of patient's allergies indicates no known allergies.  Home Medications   Current Outpatient Rx  Name Route Sig Dispense Refill  . HYDROCHLOROTHIAZIDE 25 MG PO TABS Oral Take 1 tablet (25 mg total) by mouth daily. 30 tablet 6  . METFORMIN HCL 1000 MG PO TABS Oral Take 1 tablet (1,000 mg total) by mouth 2 (two) times daily with a meal. 60 tablet 6    Please ask patient to make an appointment at our o ...  . METOPROLOL TARTRATE 50 MG PO TABS Oral Take 1 tablet (50 mg total) by mouth 2 (two) times daily. 60 tablet 5  . NITROGLYCERIN 0.3 MG/HR TD PT24 Transdermal Place 1 patch onto the skin daily.    Marland Kitchen SIMVASTATIN 40 MG PO TABS Oral Take 1 tablet (40 mg total) by mouth at bedtime. 30 tablet 5  . ALUM & MAG HYDROXIDE-SIMETH 400-400-40 MG/5ML PO SUSP Oral Take 30 mLs by mouth every 6 (six) hours as needed. dyspepsia    . BISACODYL 10 MG RE SUPP Rectal Place 10 mg rectally as needed. For constipation    .  DONEPEZIL HCL 5 MG PO TABS Oral Take 5 mg by mouth at bedtime.    Marland Kitchen ENOXAPARIN SODIUM 30 MG/0.3ML [place] SOLN Subcutaneous Inject 30 mg into the skin every 12 (twelve) hours. For 24 days    . INSULIN ASPART 100 UNIT/ML [place] SOLN Subcutaneous Inject 2-8 Units into the skin 4 (four) times daily - after meals and at bedtime.    Marland Kitchen MAGNESIUM HYDROXIDE 400 MG/5ML PO SUSP Oral Take 30 mLs by mouth at bedtime as needed.    Marland Kitchen OVER THE COUNTER MEDICATION Oral Take 1 capsule by mouth 2 (two) times daily. Vitamin B complex with iron intrinsic factor    . OXYCODONE HCL 5 MG PO CAPS Oral Take 1-2 capsules (5-10 mg total) by mouth every 4 (four) hours as needed. For severe pain 30 capsule 0  . SERTRALINE HCL 50 MG PO  TABS Oral Take 50 mg by mouth at bedtime.      BP 132/45  Pulse 102  Temp(Src) 98.7 F (37.1 C) (Oral)  Resp 19  SpO2 95%  Physical Exam  Nursing note and vitals reviewed. Constitutional: She appears well-developed and well-nourished. No distress.  HENT:  Head: Normocephalic and atraumatic.  Mouth/Throat: Oropharynx is clear and moist.  Eyes: Conjunctivae are normal. Pupils are equal, round, and reactive to light.  Neck: Normal range of motion. Neck supple.  Cardiovascular: Normal rate, regular rhythm and normal heart sounds.   No murmur heard.      Slight tachycardia.  Pulmonary/Chest: Effort normal and breath sounds normal. No respiratory distress.  Abdominal: Soft. Bowel sounds are normal. There is no tenderness.  Musculoskeletal: Normal range of motion. She exhibits no edema.       With ROM seems to be some right hip discomfort. But in general no sig pain, tenderness or deformity. Good distal cap refill to both feet, although no palpable DP or PT pulse. No color change.   Neurological: She is alert.  Skin: Skin is warm. No rash noted.    ED Course  Procedures (including critical care time)   Labs Reviewed  LAB REPORT - SCANNED   No results found. Results for orders placed in visit on 05/24/11  POCT GLYCOSYLATED HEMOGLOBIN (HGB A1C)      Component Value Range   Hemoglobin A1C 6.5    COMPREHENSIVE METABOLIC PANEL      Component Value Range   Sodium 139  135 - 145 (mEq/L)   Potassium 4.2  3.5 - 5.3 (mEq/L)   Chloride 101  96 - 112 (mEq/L)   CO2 30  19 - 32 (mEq/L)   Glucose, Bld 114 (*) 70 - 99 (mg/dL)   BUN 12  6 - 23 (mg/dL)   Creat 6.04  5.40 - 9.81 (mg/dL)   Total Bilirubin 0.3  0.3 - 1.2 (mg/dL)   Alkaline Phosphatase 89  39 - 117 (U/L)   AST 23  0 - 37 (U/L)   ALT 13  0 - 35 (U/L)   Total Protein 6.8  6.0 - 8.3 (g/dL)   Albumin 3.9  3.5 - 5.2 (g/dL)   Calcium 19.1  8.4 - 10.5 (mg/dL)  CBC      Component Value Range   WBC 6.0  4.0 - 10.5 (K/uL)   RBC  5.66 (*) 3.87 - 5.11 (MIL/uL)   Hemoglobin 12.8  12.0 - 15.0 (g/dL)   HCT 47.8  29.5 - 62.1 (%)   MCV 74.2 (*) 78.0 - 100.0 (fL)   MCH 22.6 (*) 26.0 -  34.0 (pg)   MCHC 30.5  30.0 - 36.0 (g/dL)   RDW 82.9 (*) 56.2 - 15.5 (%)   Platelets 252  150 - 400 (K/uL)   Results for orders placed in visit on 05/24/11  POCT GLYCOSYLATED HEMOGLOBIN (HGB A1C)      Component Value Range   Hemoglobin A1C 6.5    COMPREHENSIVE METABOLIC PANEL      Component Value Range   Sodium 139  135 - 145 (mEq/L)   Potassium 4.2  3.5 - 5.3 (mEq/L)   Chloride 101  96 - 112 (mEq/L)   CO2 30  19 - 32 (mEq/L)   Glucose, Bld 114 (*) 70 - 99 (mg/dL)   BUN 12  6 - 23 (mg/dL)   Creat 1.30  8.65 - 7.84 (mg/dL)   Total Bilirubin 0.3  0.3 - 1.2 (mg/dL)   Alkaline Phosphatase 89  39 - 117 (U/L)   AST 23  0 - 37 (U/L)   ALT 13  0 - 35 (U/L)   Total Protein 6.8  6.0 - 8.3 (g/dL)   Albumin 3.9  3.5 - 5.2 (g/dL)   Calcium 69.6  8.4 - 10.5 (mg/dL)  CBC      Component Value Range   WBC 6.0  4.0 - 10.5 (K/uL)   RBC 5.66 (*) 3.87 - 5.11 (MIL/uL)   Hemoglobin 12.8  12.0 - 15.0 (g/dL)   HCT 29.5  28.4 - 13.2 (%)   MCV 74.2 (*) 78.0 - 100.0 (fL)   MCH 22.6 (*) 26.0 - 34.0 (pg)   MCHC 30.5  30.0 - 36.0 (g/dL)   RDW 44.0 (*) 10.2 - 15.5 (%)   Platelets 252  150 - 400 (K/uL)   Dg Hip Complete Right  08/14/2011  *RADIOLOGY REPORT*  Clinical Data: Right hip pain after fall 1 week ago.  RIGHT HIP - COMPLETE 2+ VIEW  Comparison: 07/01/2009  Findings: Since the previous study, there is been interval placement of a left hip arthroplasty with bipolar femoral component.  Components appear well seated.  Skin clips consistent with recent surgery.  There are old healed fracture deformities of the right superior and inferior pubic rami.  Degenerative changes in the right hip with joint space narrowing, sclerosis, and acetabular hypertrophic changes.  Suggestion of vague cortical irregularities along the superior and inferior aspect of the  acetabulum which may be related to the old fractures but nondisplaced acute fractures not excluded.  No evidence of fracture or dislocation of the right femur.  Old fracture deformity of the right iliac wing.  Vascular calcifications.  IMPRESSION: Postoperative changes in the left hip.  Old healed fracture deformities of the right iliac bone, superior, and inferior pubic rami.  Focal cortical step off demonstrated superior and inferior to the acetabulum may be related to the previous fractures but acute fracture without displacement is not excluded.  The right proximal femur appears intact.  Original Report Authenticated By: Marlon Pel, M.D.   Ct Pelvis Wo Contrast  08/14/2011  *RADIOLOGY REPORT*  Clinical Data:  Fall 2 weeks ago.  Bilateral hip pain.  CT PELVIS WITHOUT CONTRAST  Technique:  Multidetector CT imaging of the pelvis was performed following the standard protocol without intravenous contrast.  Comparison:   CT abdomen and pelvis 07/01/2009.  Findings:  The right hip is located.  No acute fracture is present. There is a remote fracture of the inferior pubic ramus on the right.  The patient is status post left hip hemiarthroplasty.  The  femoral prosthesis is well-seated.  There is no acute fracture.  Mild osteopenia is evident.  The soft tissue windows demonstrate atherosclerotic calcifications at the aortic bifurcation and iliac arteries without aneurysm.  The rectosigmoid colon is within normal limits.  The uterus and adnexa are within normal limits for age. The visualized bowel is otherwise unremarkable.  IMPRESSION:  1.  No acute fracture. 2.  Remote right inferior pubic ramus fracture. 3.  Atherosclerosis. 4.  Left hip hemiarthroplasty.  Original Report Authenticated By: Jamesetta Orleans. MATTERN, M.D.     1. Hip pain, left       MDM  During ED stay patient started to complain of left hip pain and then some right. Although alert suspect component of dementia. Additional hx from  Nursing Home obtained with hx of fall 1 or 2 weeks ago. Work up today of both hips negative for fracture by CT scan ex for right pubic ramus fracture (remote) and this may be responsible for some pain.  Pt comfortable in NAD in ED. Will arrange transport back to Nursing Home.         Shelda Jakes, MD 08/26/11 [phone]

## 2011-08-28 ENCOUNTER — Ambulatory Visit: Payer: Medicare Other | Admitting: Surgery

## 2011-08-29 ENCOUNTER — Ambulatory Visit (INDEPENDENT_AMBULATORY_CARE_PROVIDER_SITE_OTHER): Payer: Medicare Other | Admitting: Vascular Surgery

## 2011-08-29 ENCOUNTER — Inpatient Hospital Stay (HOSPITAL_COMMUNITY)
Admission: EM | Admit: 2011-08-29 | Discharge: 2011-09-06 | DRG: 863 | Disposition: A | Discharge status: Skilled Nursing Facility | Payer: Medicare Other | Attending: Surgery | Admitting: Surgery

## 2011-08-29 ENCOUNTER — Encounter (HOSPITAL_COMMUNITY): Payer: Self-pay | Admitting: General Practice

## 2011-08-29 ENCOUNTER — Encounter: Payer: Self-pay | Admitting: Vascular Surgery

## 2011-08-29 ENCOUNTER — Inpatient Hospital Stay: Admission: AD | Admit: 2011-08-29 | Payer: Medicare Other | Source: Ambulatory Visit | Admitting: Surgery

## 2011-08-29 VITALS — BP 116/96 | HR 99 | Ht 67.0 in | Wt 155.0 lb

## 2011-08-29 DIAGNOSIS — K219 Gastro-esophageal reflux disease without esophagitis: Secondary | ICD-10-CM | POA: Diagnosis present

## 2011-08-29 DIAGNOSIS — T148XXA Other injury of unspecified body region, initial encounter: Secondary | ICD-10-CM

## 2011-08-29 DIAGNOSIS — F329 Major depressive disorder, single episode, unspecified: Secondary | ICD-10-CM | POA: Diagnosis present

## 2011-08-29 DIAGNOSIS — Z794 Long term (current) use of insulin: Secondary | ICD-10-CM

## 2011-08-29 DIAGNOSIS — R197 Diarrhea, unspecified: Secondary | ICD-10-CM | POA: Diagnosis present

## 2011-08-29 DIAGNOSIS — E876 Hypokalemia: Secondary | ICD-10-CM | POA: Diagnosis present

## 2011-08-29 DIAGNOSIS — B372 Candidiasis of skin and nail: Secondary | ICD-10-CM | POA: Diagnosis present

## 2011-08-29 DIAGNOSIS — F0390 Unspecified dementia without behavioral disturbance: Secondary | ICD-10-CM | POA: Diagnosis present

## 2011-08-29 DIAGNOSIS — I2510 Atherosclerotic heart disease of native coronary artery without angina pectoris: Secondary | ICD-10-CM | POA: Diagnosis present

## 2011-08-29 DIAGNOSIS — M810 Age-related osteoporosis without current pathological fracture: Secondary | ICD-10-CM | POA: Diagnosis present

## 2011-08-29 DIAGNOSIS — E785 Hyperlipidemia, unspecified: Secondary | ICD-10-CM | POA: Diagnosis present

## 2011-08-29 DIAGNOSIS — T8140XA Infection following a procedure, unspecified, initial encounter: Principal | ICD-10-CM | POA: Diagnosis present

## 2011-08-29 DIAGNOSIS — I10 Essential (primary) hypertension: Secondary | ICD-10-CM

## 2011-08-29 DIAGNOSIS — I739 Peripheral vascular disease, unspecified: Secondary | ICD-10-CM | POA: Diagnosis present

## 2011-08-29 DIAGNOSIS — F3289 Other specified depressive episodes: Secondary | ICD-10-CM | POA: Diagnosis present

## 2011-08-29 DIAGNOSIS — Y838 Other surgical procedures as the cause of abnormal reaction of the patient, or of later complication, without mention of misadventure at the time of the procedure: Secondary | ICD-10-CM | POA: Diagnosis present

## 2011-08-29 DIAGNOSIS — L03319 Cellulitis of trunk, unspecified: Secondary | ICD-10-CM

## 2011-08-29 DIAGNOSIS — E119 Type 2 diabetes mellitus without complications: Secondary | ICD-10-CM | POA: Diagnosis present

## 2011-08-29 LAB — BASIC METABOLIC PANEL
BUN: 25 mg/dL — ABNORMAL HIGH (ref 6–23)
Chloride: 95 mEq/L — ABNORMAL LOW (ref 96–112)
Creatinine, Ser: 0.49 mg/dL — ABNORMAL LOW (ref 0.50–1.10)
GFR calc Af Amer: >90 mL/min (ref >90)

## 2011-08-29 LAB — CBC
HCT: 32.5 % — ABNORMAL LOW (ref 36.0–46.0)
HCT: 31.9 % — ABNORMAL LOW (ref 36.0–46.0)
Hemoglobin: 9.7 g/dL — ABNORMAL LOW (ref 12.0–15.0)
MCH: 22.4 pg — ABNORMAL LOW (ref 26.0–34.0)
MCH: 22.5 pg — ABNORMAL LOW (ref 26.0–34.0)
MCHC: 29.8 g/dL — ABNORMAL LOW (ref 30.0–36.0)
MCHC: 30.7 g/dL (ref 30.0–36.0)
MCV: 73.3 fL — ABNORMAL LOW (ref 78.0–100.0)
RDW: 19.5 % — ABNORMAL HIGH (ref 11.5–15.5)
WBC: 9.9 10*3/uL (ref 4.0–10.5)

## 2011-08-29 LAB — LACTIC ACID, PLASMA: Lactic Acid, Venous: 4.5 mmol/L — ABNORMAL HIGH (ref 0.5–2.2)

## 2011-08-29 LAB — DIFFERENTIAL
Basophils Relative: 1 % (ref 0–1)
Eosinophils Absolute: 0 10*3/uL (ref 0.0–0.7)
Lymphs Abs: 1.1 10*3/uL (ref 0.7–4.0)
Monocytes Absolute: 0.6 10*3/uL (ref 0.1–1.0)
Neutro Abs: 8.2 10*3/uL — ABNORMAL HIGH (ref 1.7–7.7)

## 2011-08-29 LAB — COMPREHENSIVE METABOLIC PANEL
AST: 20 U/L (ref 0–37)
BUN: 26 mg/dL — ABNORMAL HIGH (ref 6–23)
CO2: 28 mEq/L (ref 19–32)
Chloride: 95 mEq/L — ABNORMAL LOW (ref 96–112)
Creatinine, Ser: 0.53 mg/dL (ref 0.50–1.10)
GFR calc non Af Amer: 87 mL/min — ABNORMAL LOW (ref >90)
Total Bilirubin: 0.2 mg/dL — ABNORMAL LOW (ref 0.3–1.2)

## 2011-08-29 MED ORDER — HYDROCHLOROTHIAZIDE 25 MG PO TABS
25.0000 mg | ORAL_TABLET | Freq: Every day | ORAL | Status: DC
  Administered 2011-08-29 – 2011-09-06 (×9): 25 mg via ORAL
  Filled 2011-08-29 (×9): qty 1

## 2011-08-29 MED ORDER — VANCOMYCIN HCL IN DEXTROSE 1-5 GM/200ML-% IV SOLN
1000.0000 mg | Freq: Once | INTRAVENOUS | Status: AC
  Administered 2011-08-29: 1000 mg via INTRAVENOUS
  Filled 2011-08-29: qty 200

## 2011-08-29 MED ORDER — NITROGLYCERIN 0.3 MG/HR TD PT24
0.3000 mg | MEDICATED_PATCH | Freq: Every day | TRANSDERMAL | Status: DC
  Administered 2011-08-29 – 2011-09-06 (×9): 0.3 mg via TRANSDERMAL
  Filled 2011-08-29 (×9): qty 1

## 2011-08-29 MED ORDER — ACETAMINOPHEN 650 MG RE SUPP: 650.0000 mg | Freq: Four times a day (QID) | RECTAL | Status: DC | PRN

## 2011-08-29 MED ORDER — SODIUM CHLORIDE 0.9 % IV SOLN
1250.0000 mg | INTRAVENOUS | Status: AC
  Administered 2011-08-30 – 2011-09-02 (×4): 1250 mg via INTRAVENOUS
  Filled 2011-08-29 (×5): qty 1250

## 2011-08-29 MED ORDER — METFORMIN HCL 500 MG PO TABS
1000.0000 mg | ORAL_TABLET | Freq: Two times a day (BID) | ORAL | Status: DC
  Administered 2011-08-29 – 2011-09-06 (×16): 1000 mg via ORAL
  Filled 2011-08-29 (×19): qty 2

## 2011-08-29 MED ORDER — PIPERACILLIN-TAZOBACTAM 3.375 G IVPB: 3.3750 g | Freq: Once | INTRAVENOUS | Status: DC

## 2011-08-29 MED ORDER — GUAIFENESIN-DM 100-10 MG/5ML PO SYRP: 15.0000 mL | ORAL_SOLUTION | ORAL | Status: DC | PRN

## 2011-08-29 MED ORDER — ONDANSETRON HCL 4 MG/2ML IJ SOLN: 4.0000 mg | Freq: Four times a day (QID) | INTRAMUSCULAR | Status: DC | PRN

## 2011-08-29 MED ORDER — POTASSIUM CHLORIDE CRYS ER 20 MEQ PO TBCR
20.0000 meq | EXTENDED_RELEASE_TABLET | Freq: Once | ORAL | Status: DC
  Filled 2011-08-29 (×2): qty 2

## 2011-08-29 MED ORDER — PIPERACILLIN SOD-TAZOBACTAM SO 2.25 (2-0.25) G IV SOLR: 3.3750 g | Freq: Four times a day (QID) | INTRAVENOUS | Status: DC

## 2011-08-29 MED ORDER — SODIUM CHLORIDE 0.9 % IV SOLN: 4.0000 mg | Freq: Four times a day (QID) | INTRAVENOUS | Status: DC | PRN

## 2011-08-29 MED ORDER — ACETAMINOPHEN 325 MG PO TABS: 325.0000 mg | ORAL_TABLET | ORAL | Status: DC | PRN

## 2011-08-29 MED ORDER — METOPROLOL TARTRATE 50 MG PO TABS
50.0000 mg | ORAL_TABLET | Freq: Two times a day (BID) | ORAL | Status: DC
  Administered 2011-08-29 – 2011-09-06 (×17): 50 mg via ORAL
  Filled 2011-08-29 (×17): qty 1

## 2011-08-29 MED ORDER — OXYCODONE HCL 5 MG PO TABS: 5.0000 mg | ORAL_TABLET | ORAL | Status: DC | PRN

## 2011-08-29 MED ORDER — SODIUM CHLORIDE 0.9 % IV SOLN
INTRAVENOUS | Status: DC
  Administered 2011-09-02: 20 mL/h via INTRAVENOUS
  Administered 2011-09-04 – 2011-09-05 (×2): via INTRAVENOUS

## 2011-08-29 MED ORDER — PIPERACILLIN-TAZOBACTAM 3.375 G IVPB 30 MIN
3.3750 g | Freq: Three times a day (TID) | INTRAVENOUS | Status: DC
  Administered 2011-08-29 – 2011-09-01 (×8): 3.375 g via INTRAVENOUS
  Filled 2011-08-29 (×10): qty 50

## 2011-08-29 MED ORDER — DONEPEZIL HCL 5 MG PO TABS
5.0000 mg | ORAL_TABLET | Freq: Every day | ORAL | Status: DC
  Administered 2011-08-29 – 2011-09-05 (×8): 5 mg via ORAL
  Filled 2011-08-29 (×9): qty 1

## 2011-08-29 MED ORDER — FAMOTIDINE IN NACL 20-0.9 MG/50ML-% IV SOLN
20.0000 mg | Freq: Two times a day (BID) | INTRAVENOUS | Status: DC
  Administered 2011-08-29: 20 mg via INTRAVENOUS
  Filled 2011-08-29 (×3): qty 50

## 2011-08-29 MED ORDER — PHENOL 1.4 % MT LIQD
1.0000 | OROMUCOSAL | Status: DC | PRN
  Filled 2011-08-29: qty 177

## 2011-08-29 MED ORDER — INSULIN ASPART 100 UNIT/ML ~~LOC~~ SOLN
0.0000 [IU] | Freq: Three times a day (TID) | SUBCUTANEOUS | Status: DC
  Administered 2011-08-30 – 2011-09-05 (×5): 2 [IU] via SUBCUTANEOUS
  Filled 2011-08-29 (×2): qty 3

## 2011-08-29 MED ORDER — INSULIN ASPART 100 UNIT/ML ~~LOC~~ SOLN: 2.0000 [IU] | Freq: Three times a day (TID) | SUBCUTANEOUS | Status: DC

## 2011-08-29 MED ORDER — LABETALOL HCL 5 MG/ML IV SOLN
10.0000 mg | INTRAVENOUS | Status: DC | PRN
  Filled 2011-08-29: qty 4

## 2011-08-29 MED ORDER — ACETAMINOPHEN 325 MG PO TABS: 650.0000 mg | ORAL_TABLET | Freq: Four times a day (QID) | ORAL | Status: DC | PRN

## 2011-08-29 MED ORDER — OXYCODONE HCL 5 MG PO TABS
5.0000 mg | ORAL_TABLET | ORAL | Status: DC | PRN
  Administered 2011-08-29 – 2011-09-04 (×5): 10 mg via ORAL
  Filled 2011-08-29 (×5): qty 2

## 2011-08-29 MED ORDER — SODIUM CHLORIDE 0.9 % IV SOLN
Freq: Once | INTRAVENOUS | Status: AC
  Administered 2011-08-29: 15:00:00 via INTRAVENOUS

## 2011-08-29 MED ORDER — ONDANSETRON HCL 4 MG PO TABS: 4.0000 mg | ORAL_TABLET | Freq: Four times a day (QID) | ORAL | Status: DC | PRN

## 2011-08-29 MED ORDER — METOPROLOL TARTRATE 1 MG/ML IV SOLN: 2.0000 mg | INTRAVENOUS | Status: DC | PRN

## 2011-08-29 MED ORDER — DOCUSATE SODIUM 100 MG PO CAPS
100.0000 mg | ORAL_CAPSULE | Freq: Two times a day (BID) | ORAL | Status: DC
  Administered 2011-08-30 – 2011-09-03 (×8): 100 mg via ORAL
  Filled 2011-08-29 (×16): qty 1

## 2011-08-29 MED ORDER — SIMVASTATIN 40 MG PO TABS
40.0000 mg | ORAL_TABLET | Freq: Every day | ORAL | Status: DC
  Administered 2011-08-29 – 2011-09-05 (×8): 40 mg via ORAL
  Filled 2011-08-29 (×9): qty 1

## 2011-08-29 MED ORDER — SERTRALINE HCL 50 MG PO TABS
50.0000 mg | ORAL_TABLET | Freq: Every day | ORAL | Status: DC
  Administered 2011-08-29 – 2011-09-05 (×8): 50 mg via ORAL
  Filled 2011-08-29 (×9): qty 1

## 2011-08-29 MED ORDER — ACETAMINOPHEN 650 MG RE SUPP: 325.0000 mg | RECTAL | Status: DC | PRN

## 2011-08-29 MED ORDER — HYDRALAZINE HCL 20 MG/ML IJ SOLN
10.0000 mg | INTRAMUSCULAR | Status: DC | PRN
  Filled 2011-08-29: qty 0.5

## 2011-08-29 MED ORDER — VANCOMYCIN HCL 1000 MG IV SOLR
1250.0000 mg | INTRAVENOUS | Status: DC
  Filled 2011-08-29: qty 1250

## 2011-08-29 NOTE — ED Provider Notes (Signed)
History     CSN: [id number]  Arrival date & time 08/29/11  1405   First MD Initiated Contact with Patient 08/29/11 1420      Chief Complaint  Patient presents with  . Wound Infection    (Consider location/radiation/quality/duration/timing/severity/associated sxs/prior treatment) HPI Comments: Patient from vascular surgery office with wound infection.  She had axillary femoral bypass by Dr. Myra Gianotti on 2/13.  She saw Dr. Hart Rochester in clinic today would evaluated her R groin wound and sent he over to be admitted for antibiotics.  Patient has dementia and is unable to provide history.  No report of fever, vomiting.  Patient with very limited motor function in R foot and ankle as prior to surgery.  The history is provided by the patient.    Past Medical History  Diagnosis Date  . Delirium 12/10/08    hospitalized  . Pelvic fracture 07/01/09    hospitalized  . Coronary artery disease   . Femoral neck fracture   . Anemia   . Hypertension   . Diabetes mellitus     Past Surgical History  Procedure Date  . Cholecystectomy   . Cataract extraction   . Axillary-femoral bypass graft 08/15/2011    Procedure: BYPASS GRAFT AXILLA-BIFEMORAL;  Surgeon: [name], MD;  Location: [facility] OR;  Service: Vascular;  Laterality: Right;  Embolectomy of Right Femoral Artery with Right  Axilla Femoral Bypass Graft.    Family History  Problem Relation Age of Onset  . Heart failure Father     died age 81  . Stroke Mother     died age 70    History  Substance Use Topics  . Smoking status: Former Games developer  . Smokeless tobacco: Never Used  . Alcohol Use: No    OB History    Grav Para Term Preterm Abortions TAB SAB Ect Mult Living                  Review of Systems  Unable to perform ROS: Dementia    Allergies  Review of patient's allergies indicates no known allergies.  Home Medications   No current outpatient prescriptions on file.  BP 112/49  Pulse 92  Temp(Src) 97.9 F (36.6 C)  (Oral)  Resp 18  Ht 5\' 7"  (1.702 m)  Wt 170 lb 6.7 oz (77.3 kg)  BMI 26.69 kg/m2  SpO2 96%  Physical Exam  Constitutional: She appears well-developed and well-nourished. No distress.  HENT:  Head: Normocephalic.  Mouth/Throat: Oropharynx is clear and moist. No oropharyngeal exudate.  Eyes: Conjunctivae and EOM are normal. Pupils are equal, round, and reactive to light.  Neck: Normal range of motion.  Cardiovascular: Normal rate and regular rhythm.   No murmur heard. Pulmonary/Chest: Effort normal and breath sounds normal. No respiratory distress.  Abdominal: Soft. There is no tenderness. There is no rebound and no guarding.  Musculoskeletal: Normal range of motion. She exhibits tenderness.       Weak R ankle flexion and extension.  Unable to palpate DP or PT pulses.  Unable to wiggle toes (baseline).   R groin with skin necrosis, purulent drainage from staple line.    Neurological: She is alert. No cranial nerve deficit.  Skin: Skin is warm.    ED Course  Procedures (including critical care time)  Labs Reviewed  CBC - Abnormal; Notable for the following:    Hemoglobin 9.7 (*)    HCT 32.5 (*)    MCV 74.9 (*)    MCH 22.4 (*)  MCHC 29.8 (*)    RDW 19.6 (*)    Platelets 415 (*)    All other components within normal limits  DIFFERENTIAL - Abnormal; Notable for the following:    Neutrophils Relative 82 (*)    Lymphocytes Relative 11 (*)    Neutro Abs 8.2 (*)    All other components within normal limits  COMPREHENSIVE METABOLIC PANEL - Abnormal; Notable for the following:    Sodium 131 (*)    Chloride 95 (*)    Glucose, Bld 155 (*)    BUN 26 (*)    Albumin 2.5 (*)    Alkaline Phosphatase 130 (*)    Total Bilirubin 0.2 (*)    GFR calc non Af Amer 87 (*)    All other components within normal limits  LACTIC ACID, PLASMA - Abnormal; Notable for the following:    Lactic Acid, Venous 4.5 (*)    All other components within normal limits  GLUCOSE, CAPILLARY - Abnormal;  Notable for the following:    Glucose-Capillary 159 (*)    All other components within normal limits  CULTURE, BLOOD (ROUTINE X 2)  CULTURE, BLOOD (ROUTINE X 2)  CBC  BASIC METABOLIC PANEL  PROTIME-INR  MRSA PCR SCREENING  WOUND CULTURE   No results found.   1. Wound infection   2. Essential hypertension, benign   3. Other and unspecified hyperlipidemia       MDM  Postoperative wound infection.  No change in poor distal neurovascular exam.  Labs, cultures, IV antibiotics.  Admit to vascular surgery.        Glynn Octave, MD 08/29/11 [phone]

## 2011-08-29 NOTE — ED Notes (Signed)
MD at bedside. 

## 2011-08-29 NOTE — Progress Notes (Signed)
Subjective:     Patient ID: Mackenzie [name], female   DOB: [birth year], 78 y.o.   MRN: [id number]  HPI this 78 year old female recently had a right axillobifemoral bypass by Dr. Myra Gianotti for limb salvage. She was at [facility] and rehabilitation Center. She returns today with right groin wound problems.  Past Medical History  Diagnosis Date  . Delirium 12/10/08    hospitalized  . Pelvic fracture 07/01/09    hospitalized  . Coronary artery disease   . Femoral neck fracture   . Anemia   . Hypertension   . Diabetes mellitus     History  Substance Use Topics  . Smoking status: Former Games developer  . Smokeless tobacco: Never Used  . Alcohol Use: No    Family History  Problem Relation Age of Onset  . Heart failure Father     died age 45  . Stroke Mother     died age 61    No Known Allergies  Current outpatient prescriptions:alum & mag hydroxide-simeth ([place] DOUBLE-STRENGTH) 400-400-40 MG/5ML suspension, Take 30 mLs by mouth every 6 (six) hours as needed. dyspepsia, Disp: , Rfl: ;  bisacodyl (DULCOLAX) 10 MG suppository, Place 10 mg rectally as needed. For constipation, Disp: , Rfl: ;  donepezil (ARICEPT) 5 MG tablet, Take 5 mg by mouth at bedtime., Disp: , Rfl:  hydrochlorothiazide (HYDRODIURIL) 25 MG tablet, Take 1 tablet (25 mg total) by mouth daily., Disp: 30 tablet, Rfl: 6;  insulin aspart (NOVOLOG) 100 UNIT/ML injection, Inject 2-8 Units into the skin 4 (four) times daily - after meals and at bedtime., Disp: , Rfl: ;  magnesium hydroxide (MILK OF MAGNESIA) 400 MG/5ML suspension, Take 30 mLs by mouth at bedtime as needed., Disp: , Rfl:  metFORMIN (GLUCOPHAGE) 1000 MG tablet, Take 1 tablet (1,000 mg total) by mouth 2 (two) times daily with a meal., Disp: 60 tablet, Rfl: 6;  metoprolol (LOPRESSOR) 50 MG tablet, Take 1 tablet (50 mg total) by mouth 2 (two) times daily., Disp: 60 tablet, Rfl: 5;  nitroGLYCERIN (NITRODUR - DOSED IN MG/24 HR) 0.3 mg/hr, Place 1 patch onto the skin  daily., Disp: , Rfl:  OVER THE COUNTER MEDICATION, Take 1 capsule by mouth 2 (two) times daily. Vitamin B complex with iron intrinsic factor, Disp: , Rfl: ;  oxycodone (OXY-IR) 5 MG capsule, Take 1-2 capsules (5-10 mg total) by mouth every 4 (four) hours as needed. For severe pain, Disp: 30 capsule, Rfl: 0;  sertraline (ZOLOFT) 50 MG tablet, Take 50 mg by mouth at bedtime., Disp: , Rfl:  simvastatin (ZOCOR) 40 MG tablet, Take 1 tablet (40 mg total) by mouth at bedtime., Disp: 30 tablet, Rfl: 5  There were no vitals taken for this visit.  There is no height or weight on file to calculate BMI.         Review of Systems     Objective:   Physical Exam Patient is elderly alert and oriented x3 Chest no rhonchi or wheezing Cardiovascular regular rhythm no murmurs Abdomen soft nontender with no masses Right axillary wound is healing nicely. There is a 2-3+ graft pulse the midaxillary line of rectal femoral graft. The right inguinal wound has extensive skin necrosis. The graft is not exposed. Skin staples were removed and the central area where the skin necrosis extended over a 3 x 4 cm area and about 3 cm in depth. This was sharply debrided.    Assessment:     Right inguinal wound following right axillofemoral bypass  graft on 08/16/2011    Plan:     Admit today for wound care with IV antibiotics

## 2011-08-29 NOTE — Progress Notes (Addendum)
  Infectious Disease Pharmacy     Total Antibiotics Day #1         Vancomycin   2/26--->          Zosyn 2/26  --->             Mackenzie [name] is an 78 y.o. female who presented to [facility] on 08/29/2011 with a chief complaint of  Chief Complaint  Patient presents with  . Wound Infection     Filed Vitals:   08/29/11 1730  BP: 112/49  Pulse: 92  Temp: 97.9 F (36.6 C)  Resp: 18    Lab 08/29/11 1431  NA 131*  K 4.6  CL 95*  CO2 28  GLUCOSE 155*  BUN 26*  CREATININE 0.53  CALCIUM 10.3  MG --  PHOS --    Lab 08/29/11 1431  WBC 10.0    Past Medical History  Diagnosis Date  . Delirium 12/10/08    hospitalized  . Pelvic fracture 07/01/09    hospitalized  . Coronary artery disease   . Femoral neck fracture   . Anemia   . Hypertension   . Diabetes mellitus       Recent Results (from the past 720 hour(s))  SURGICAL PCR SCREEN     Status: Normal   Collection Time   08/15/11  6:31 PM      Component Value Range Status Comment   MRSA, PCR NEGATIVE  NEGATIVE  Final    Staphylococcus aureus NEGATIVE  NEGATIVE  Final     Assessment: 78 year old woman s/p right axillofemoral bypass graft surgery on 2/13 presents today with right groin wound infection.  Patient to be started on IV vancomycin and Zosyn after cultures obtained.  Plan:    Vancomycin 1250mg  IV q24.  Goal trough is 10-15mg /L.  Will follow renal function, cultures, and clinical progress with surgical team.    Celedonio Miyamoto, PharmD, BCPS [phone]

## 2011-08-29 NOTE — ED Notes (Signed)
Pt was transported via EMS from Vascular center. Pt had a axillobifermoral bypass by Dr Myra Gianotti on 2.13.13. Pt is from [facility]

## 2011-08-29 NOTE — H&P (Signed)
[facility] SHORT STAY H&P  No chief complaint on file. :   HPI:  S/P Procedure: Redo R common femoral exposure, Right Axillary to Femoral bypass with 8mm ringed PTFE The patient was seen in the office and was diagnosed with right groin wound infection.  She will be admitted to the hospital for work up.  Past Medical History  Diagnosis Date  . Delirium 12/10/08    hospitalized  . Pelvic fracture 07/01/09    hospitalized  . Coronary artery disease   . Femoral neck fracture   . Anemia   . Hypertension   . Diabetes mellitus     FH:  Non-Contributory  History   Social History  . Marital Status: Married    Spouse Name: Leonette Most    Number of Children: N/A  . Years of Education: 12   Occupational History  . Not on file.   Social History Main Topics  . Smoking status: Former Games developer  . Smokeless tobacco: Never Used  . Alcohol Use: No  . Drug Use: Not on file  . Sexually Active: Not Currently   Other Topics Concern  . Not on file   Social History Narrative   Her husband died in 06-25-11.   She has a younger friend who is helping take care of her checking daily and giving her medications.  Her dtr and son have plans to move her to an assited living situationDaughter, Clemetine Marker, is closest in [place], [place] close to [employer], Laymond Purser lives locallySon Robbie  in [place]    No Known Allergies  Current Facility-Administered Medications  Medication Dose Route Frequency Provider Last Rate Last Dose  . acetaminophen (TYLENOL) tablet 650 mg  650 mg Oral Q6H PRN Mosetta Pigeon, PA       Or  . acetaminophen (TYLENOL) suppository 650 mg  650 mg Rectal Q6H PRN Mosetta Pigeon, PA      . ondansetron [facility]) tablet 4 mg  4 mg Oral Q6H PRN Mosetta Pigeon, PA       Or  . ondansetron (ZOFRAN) 4 mg in sodium chloride 0.9 % 50 mL IVPB  4 mg Intravenous Q6H PRN Mosetta Pigeon, PA      . oxyCODONE (Oxy IR/ROXICODONE) immediate release  tablet 5 mg  5 mg Oral Q4H PRN Mosetta Pigeon, PA       Current Outpatient Prescriptions  Medication Sig Dispense Refill  . alum & mag hydroxide-simeth (MYLANTA DOUBLE-STRENGTH) 400-400-40 MG/5ML suspension Take 30 mLs by mouth every 6 (six) hours as needed. dyspepsia      . bisacodyl (DULCOLAX) 10 MG suppository Place 10 mg rectally as needed. For constipation      . donepezil (ARICEPT) 5 MG tablet Take 5 mg by mouth at bedtime.      . hydrochlorothiazide (HYDRODIURIL) 25 MG tablet Take 1 tablet (25 mg total) by mouth daily.  30 tablet  6  . insulin aspart (NOVOLOG) 100 UNIT/ML injection Inject 2-8 Units into the skin 4 (four) times daily - after meals and at bedtime.      . magnesium hydroxide (MILK OF MAGNESIA) 400 MG/5ML suspension Take 30 mLs by mouth at bedtime as needed.      . metFORMIN (GLUCOPHAGE) 1000 MG tablet Take 1 tablet (1,000 mg total) by mouth 2 (two) times daily with a meal.  60 tablet  6  . metoprolol (LOPRESSOR) 50 MG tablet Take 1 tablet (50 mg total) by mouth 2 (two) times daily.  60 tablet  5  . nitroGLYCERIN (NITRODUR - DOSED IN MG/24 HR) 0.3 mg/hr Place 1 patch onto the skin daily.      Marland Kitchen OVER THE COUNTER MEDICATION Take 1 capsule by mouth 2 (two) times daily. Vitamin B complex with iron intrinsic factor      . oxycodone (OXY-IR) 5 MG capsule Take 1-2 capsules (5-10 mg total) by mouth every 4 (four) hours as needed. For severe pain  30 capsule  0  . sertraline (ZOLOFT) 50 MG tablet Take 50 mg by mouth at bedtime.      . simvastatin (ZOCOR) 40 MG tablet Take 1 tablet (40 mg total) by mouth at bedtime.  30 tablet  5    ROS:  See HPI  PHYSICAL EXAM Physical Exam  Patient is elderly alert and oriented x3  Chest no rhonchi or wheezing  Cardiovascular regular rhythm no murmurs  Abdomen soft nontender with no masses  Right axillary wound is healing nicely. There is a 2-3+ graft pulse the midaxillary line of rectal femoral graft. The right inguinal wound has  extensive skin necrosis. The graft is not exposed. Skin staples were removed and the central area where the skin necrosis extended over a 3 x 4 cm area and about 3 cm in depth. This was sharply debrided.      Plan:  Admit today for wound care with IV antibiotics     [facility] [phone] @TODAY @ 1:45 PM

## 2011-08-29 NOTE — ED Notes (Signed)
2023-01 Ready 

## 2011-08-30 LAB — GLUCOSE, CAPILLARY: Glucose-Capillary: 109 mg/dL — ABNORMAL HIGH (ref 70–99)

## 2011-08-30 MED ORDER — FAMOTIDINE 20 MG PO TABS
20.0000 mg | ORAL_TABLET | Freq: Two times a day (BID) | ORAL | Status: DC
  Administered 2011-08-30 – 2011-09-06 (×15): 20 mg via ORAL
  Filled 2011-08-30 (×16): qty 1

## 2011-08-30 NOTE — Progress Notes (Addendum)
[facility] [place]  Progress Note Bypass Surgery  Date of Surgery: Redo R common femoral exposure, Right Axillary to Femoral bypass with 8mm ringed PTFE    Surgeon:   Dr. Myra Gianotti  History of Present Illness  Mackenzie [name] is a 78 y.o. female who is S/P  right.   VASC. LAB Studies:Redo R common femoral exposure, Right Axillary to Femoral bypass with 8mm ringed PTFE With right groin wound infection.      Imaging: No results found.  Significant Diagnostic Studies: CBC Lab Results  Component Value Date   WBC 9.9 08/29/2011   HGB 9.8* 08/29/2011   HCT 31.9* 08/29/2011   MCV 73.3* 08/29/2011   PLT 368 08/29/2011    BMET @LASTCHEMISTRY @     Component Value Date/Time   NA 132* 08/29/2011 1749   K 4.4 08/29/2011 1749   CL 95* 08/29/2011 1749   CO2 27 08/29/2011 1749   GLUCOSE 110* 08/29/2011 1749   BUN 25* 08/29/2011 1749   CREATININE 0.49* 08/29/2011 1749   CREATININE 0.58 05/24/2011 1046   CALCIUM 9.9 08/29/2011 1749   GFRNONAA 89* 08/29/2011 1749   GFRAA >90 08/29/2011 1749    COAG Lab Results  Component Value Date   INR 1.09 08/29/2011   INR 0.98 08/15/2011   INR 1.03 07/01/2009   No results found for this basename: PTT    Physical Examination  BP Readings from Last 3 Encounters:  08/30/11 139/55  08/29/11 116/96  08/21/11 150/67   Temp Readings from Last 3 Encounters:  08/30/11 98.3 F (36.8 C) Oral  08/21/11 97.9 F (36.6 C) Oral  08/14/11 98.7 F (37.1 C) Oral   SpO2 Readings from Last 3 Encounters:  08/30/11 94%  08/29/11 100%  08/21/11 97%   Pulse Readings from Last 3 Encounters:  08/30/11 85  08/29/11 99  08/21/11 95    Pt is A&O x 3 right lower extremity: Incision/s is/are draining or clean,dry.intact, and  erythematous or draining with hematoma, erythema or drainage at the groin wound Limb is warm; with good color  Right Dorsalis Pedis pulse is present and palpable RightPosterior tibial pulse is  absent and  palpable     Assessment: Pt. Doing well Post-op pain is controlled Wound care.   Plan: Pending wound cultures, wet to dry dressing changes, antibiotics iv. Continue wound care as ordered  [name] [facility] [phone] 08/30/2011 8:07 AM        Agree with above  Durene Cal

## 2011-08-30 NOTE — Progress Notes (Signed)
Pt stated that she wanted her fluid turned off while she slept bc of the constant alarms from the IV pole. I explained the importance  Of the fluids and that  She needed to keep her arm straight due to the positioning of her IV. Pt states the she wants her fluid resumed in the morning.  Mackenzie [name]

## 2011-08-30 NOTE — H&P (Addendum)
Mackenzie Gip, MD 08/29/2011 1:37 PM Signed  Subjective:   Patient ID: Mackenzie [name], female DOB: [birth year], 78 y.o. MRN: [id number]  HPI this 78 year old female recently had a right axillobifemoral bypass by Dr. Myra Gianotti for limb salvage. She was at [facility]. She returns today with right groin wound problems.  Past Medical History   Diagnosis  Date   .  Delirium  12/10/08     hospitalized   .  Pelvic fracture  07/01/09     hospitalized   .  Coronary artery disease    .  Femoral neck fracture    .  Anemia    .  Hypertension    .  Diabetes mellitus     History   Substance Use Topics   .  Smoking status:  Former Games developer   .  Smokeless tobacco:  Never Used   .  Alcohol Use:  No    Family History   Problem  Relation  Age of Onset   .  Heart failure  Father       died age 71    .  Stroke  Mother       died age 83    No Known Allergies  Current outpatient prescriptions:alum & mag hydroxide-simeth (MYLANTA DOUBLE-STRENGTH) 400-400-40 MG/5ML suspension, Take 30 mLs by mouth every 6 (six) hours as needed. dyspepsia, Disp: , Rfl: ; bisacodyl (DULCOLAX) 10 MG suppository, Place 10 mg rectally as needed. For constipation, Disp: , Rfl: ; donepezil (ARICEPT) 5 MG tablet, Take 5 mg by mouth at bedtime., Disp: , Rfl:  hydrochlorothiazide (HYDRODIURIL) 25 MG tablet, Take 1 tablet (25 mg total) by mouth daily., Disp: 30 tablet, Rfl: 6; insulin aspart (NOVOLOG) 100 UNIT/ML injection, Inject 2-8 Units into the skin 4 (four) times daily - after meals and at bedtime., Disp: , Rfl: ; magnesium hydroxide (MILK OF MAGNESIA) 400 MG/5ML suspension, Take 30 mLs by mouth at bedtime as needed., Disp: , Rfl:  metFORMIN (GLUCOPHAGE) 1000 MG tablet, Take 1 tablet (1,000 mg total) by mouth 2 (two) times daily with a meal., Disp: 60 tablet, Rfl: 6; metoprolol (LOPRESSOR) 50 MG tablet, Take 1 tablet (50 mg total) by mouth 2 (two) times daily., Disp: 60 tablet, Rfl: 5; nitroGLYCERIN  (NITRODUR - DOSED IN MG/24 HR) 0.3 mg/hr, Place 1 patch onto the skin daily., Disp: , Rfl:  OVER THE COUNTER MEDICATION, Take 1 capsule by mouth 2 (two) times daily. Vitamin B complex with iron intrinsic factor, Disp: , Rfl: ; oxycodone (OXY-IR) 5 MG capsule, Take 1-2 capsules (5-10 mg total) by mouth every 4 (four) hours as needed. For severe pain, Disp: 30 capsule, Rfl: 0; sertraline (ZOLOFT) 50 MG tablet, Take 50 mg by mouth at bedtime., Disp: , Rfl:  simvastatin (ZOCOR) 40 MG tablet, Take 1 tablet (40 mg total) by mouth at bedtime., Disp: 30 tablet, Rfl: 5  There were no vitals taken for this visit.  There is no height or weight on file to calculate BMI.  Review of Systems   Objective:   Physical Exam  Patient is elderly alert and oriented x3  Chest no rhonchi or wheezing  Cardiovascular regular rhythm no murmurs  Abdomen soft nontender with no masses  Right axillary wound is healing nicely. There is a 2-3+ graft pulse the midaxillary line of rectal femoral graft. The right inguinal wound has extensive skin necrosis. The graft is not exposed. Skin staples were removed and the central area where the skin  necrosis extended over a 3 x 4 cm area and about 3 cm in depth. This was sharply debrided.   Assessment:    Right inguinal wound following right axillofemoral bypass graft on 08/16/2011   Plan:    Admit today for wound care with IV antibiotics

## 2011-08-31 LAB — BASIC METABOLIC PANEL
BUN: 14 mg/dL (ref 6–23)
Calcium: 9.4 mg/dL (ref 8.4–10.5)
Creatinine, Ser: 0.48 mg/dL — ABNORMAL LOW (ref 0.50–1.10)
GFR calc Af Amer: >90 mL/min (ref >90)
GFR calc non Af Amer: 90 mL/min — ABNORMAL LOW (ref >90)
Glucose, Bld: 159 mg/dL — ABNORMAL HIGH (ref 70–99)
Potassium: 3.4 mEq/L — ABNORMAL LOW (ref 3.5–5.1)

## 2011-08-31 LAB — CBC
HCT: 31.1 % — ABNORMAL LOW (ref 36.0–46.0)
MCH: 22.3 pg — ABNORMAL LOW (ref 26.0–34.0)
MCHC: 29.9 g/dL — ABNORMAL LOW (ref 30.0–36.0)
MCV: 74.6 fL — ABNORMAL LOW (ref 78.0–100.0)
RDW: 19.5 % — ABNORMAL HIGH (ref 11.5–15.5)

## 2011-08-31 LAB — GLUCOSE, CAPILLARY: Glucose-Capillary: 134 mg/dL — ABNORMAL HIGH (ref 70–99)

## 2011-08-31 MED ORDER — SODIUM CHLORIDE 0.9 % IJ SOLN
10.0000 mL | INTRAMUSCULAR | Status: DC | PRN
  Administered 2011-08-31 – 2011-09-06 (×8): 10 mL

## 2011-08-31 NOTE — Progress Notes (Deleted)
Blood pressure taken at 9:55

## 2011-08-31 NOTE — Progress Notes (Signed)
[facility] [place]  Subjective  - HD#3  No complaints   Physical Exam:  r groin with pink granulation tissue No drainage or erythema Does not track        Assessment/Plan:    con't abx Wet to dry dsg changes  Mackenzie [name] IV, V. WELLS 08/31/2011 2:28 PM --  Filed Vitals:   08/31/11 1355  BP: 116/58  Pulse: 84  Temp: 97.1 F (36.2 C)  Resp: 16    Intake/Output Summary (Last 24 hours) at 08/31/11 1428 Last data filed at 08/31/11 1331  Gross per 24 hour  Intake   1760 ml  Output    850 ml  Net    910 ml     Laboratory CBC    Component Value Date/Time   WBC 6.4 08/31/2011 0700   HGB 9.3* 08/31/2011 0700   HCT 31.1* 08/31/2011 0700   PLT 331 08/31/2011 0700    BMET    Component Value Date/Time   NA 131* 08/31/2011 0815   K 3.4* 08/31/2011 0815   CL 92* 08/31/2011 0815   CO2 28 08/31/2011 0815   GLUCOSE 159* 08/31/2011 0815   BUN 14 08/31/2011 0815   CREATININE 0.48* 08/31/2011 0815   CREATININE 0.58 05/24/2011 1046   CALCIUM 9.4 08/31/2011 0815   GFRNONAA 90* 08/31/2011 0815   GFRAA >90 08/31/2011 0815    COAG Lab Results  Component Value Date   INR 1.09 08/29/2011   INR 0.98 08/15/2011   INR 1.03 07/01/2009   No results found for this basename: PTT    Antibiotics Anti-infectives     Start     Dose/Rate Route Frequency Ordered Stop   08/30/11 1000   vancomycin (VANCOCIN) 1,250 mg in sodium chloride 0.9 % 250 mL IVPB        1,250 mg 166.7 mL/hr over 90 Minutes Intravenous Every 24 hours 08/29/11 1756     08/29/11 1830   vancomycin (VANCOCIN) 1,250 mg in sodium chloride 0.9 % 250 mL IVPB  Status:  Discontinued        1,250 mg 166.7 mL/hr over 90 Minutes Intravenous Every 24 hours 08/29/11 1754 08/29/11 1755   08/29/11 1830  piperacillin-tazobactam (ZOSYN) IVPB 3.375 g       3.375 g 100 mL/hr over 30 Minutes Intravenous 3 times per day 08/29/11 1818     08/29/11 1430   vancomycin (VANCOCIN) IVPB 1000 mg/200 mL premix          1,000 mg 200 mL/hr over 60 Minutes Intravenous  Once 08/29/11 1427 08/29/11 1609   08/29/11 1430  piperacillin-tazobactam (ZOSYN) IVPB 3.375 g       3.375 g 12.5 mL/hr over 240 Minutes Intravenous  Once 08/29/11 1427             V. Durene Cal IV, M.D. [facility] [place] Office: [phone] Pager:  [phone]

## 2011-08-31 NOTE — Progress Notes (Signed)
Utilization review completed. Annelyse Rey, RN, BSN. 08/31/11  

## 2011-08-31 NOTE — Progress Notes (Signed)
Discontinued contact precautions per infection prevention.  MRSA swab negative as well as wound culture.  Will continue to monitor.

## 2011-08-31 NOTE — Progress Notes (Signed)
CSW received consult for "admitted from a facility" from MD. Pt is a resident of [facility]. CSW met with pt to address consult. Pt appeared to be confused. Pt shared that her main contact is Freddy Jaksch, her niece. CSW will attempt to contact pt's niece. CSW contacted [facility] and they will be ready to accept when she is medically stable. CSW to update FL2 and send information to [facility]. CSW will continue to follow to facilitate discharge to [facility].   Dede Query, MSW, Theresia Majors [phone]

## 2011-09-01 LAB — WOUND CULTURE
Culture: NO GROWTH
Gram Stain: NONE SEEN

## 2011-09-01 LAB — GLUCOSE, CAPILLARY
Glucose-Capillary: 118 mg/dL — ABNORMAL HIGH (ref 70–99)
Glucose-Capillary: 105 mg/dL — ABNORMAL HIGH (ref 70–99)
Glucose-Capillary: 108 mg/dL — ABNORMAL HIGH (ref 70–99)

## 2011-09-01 MED ORDER — PIPERACILLIN-TAZOBACTAM 3.375 G IVPB
3.3750 g | Freq: Three times a day (TID) | INTRAVENOUS | Status: DC
  Administered 2011-09-01 – 2011-09-05 (×12): 3.375 g via INTRAVENOUS
  Filled 2011-09-01 (×13): qty 50

## 2011-09-01 NOTE — Progress Notes (Signed)
CSW completed FL2 and sent it to [facility]. CSW also spoke with pt's niece, Freddy Jaksch, and she is agreeable for pt to return to [facility] when medically ready. CSW will continue to follow and facilitate pt's return to [facility].   Dede Query, MSW, Theresia Majors [phone]

## 2011-09-01 NOTE — Progress Notes (Signed)
[facility] Progress Note  09/01/2011 11:03 AM  HD 4  Subjective:  No complaints   Filed Vitals:   09/01/11 0540  BP: 130/68  Pulse: 84  Temp: 98.1 F (36.7 C)  Resp: 16    Physical Exam: Incisions:  Right groin incision with good pink granulation tissue   CBC    Component Value Date/Time   WBC 6.4 08/31/2011 0700   RBC 4.17 08/31/2011 0700   HGB 9.3* 08/31/2011 0700   HCT 31.1* 08/31/2011 0700   PLT 331 08/31/2011 0700   MCV 74.6* 08/31/2011 0700   MCH 22.3* 08/31/2011 0700   MCHC 29.9* 08/31/2011 0700   RDW 19.5* 08/31/2011 0700   LYMPHSABS 1.1 08/29/2011 1431   MONOABS 0.6 08/29/2011 1431   EOSABS 0.0 08/29/2011 1431   BASOSABS 0.1 08/29/2011 1431    BMET    Component Value Date/Time   NA 131* 08/31/2011 0815   K 3.4* 08/31/2011 0815   CL 92* 08/31/2011 0815   CO2 28 08/31/2011 0815   GLUCOSE 159* 08/31/2011 0815   BUN 14 08/31/2011 0815   CREATININE 0.48* 08/31/2011 0815   CREATININE 0.58 05/24/2011 1046   CALCIUM 9.4 08/31/2011 0815   GFRNONAA 90* 08/31/2011 0815   GFRAA >90 08/31/2011 0815    INR    Component Value Date/Time   INR 1.09 08/29/2011 1749     Intake/Output Summary (Last 24 hours) at 09/01/11 1103 Last data filed at 09/01/11 0800  Gross per 24 hour  Intake   1520 ml  Output    300 ml  Net   1220 ml     Assessment/Plan:  78 y.o. female is s/p right axillobifemoral bypass re-admitted with right groin wound infection HD 3 -right groin wound looks good and healing well. -will need to stay through the weekend.   Newton Pigg, PA-C [facility] [phone] 09/01/2011 11:03 AM

## 2011-09-01 NOTE — Progress Notes (Signed)
ANTIBIOTIC CONSULT NOTE - FOLLOW UP  Pharmacy Consult:  Vanc/Zosyn D#4 Indication: right groin wound infection  No Known Allergies  Patient Measurements: Height: 5\' 7"  (170.2 cm) Weight: 170 lb 6.7 oz (77.3 kg) IBW/kg (Calculated) : 61.6   Vital Signs: Temp: 98.1 F (36.7 C) (03/01 0540) Temp src: Oral (03/01 0540) BP: 130/68 mmHg (03/01 0540) Pulse Rate: 84  (03/01 0540) Intake/Output from previous day: 02/28 0701 - 03/01 0700 In: 1640 [P.O.:840; IV Piggyback:800] Out: 200 [Urine:200] Intake/Output from this shift: Total I/O In: 120 [P.O.:120] Out: 200 [Urine:200]  Labs:  Basename 08/31/11 0815 08/31/11 0700 08/29/11 1749 08/29/11 1431  WBC -- 6.4 9.9 10.0  HGB -- 9.3* 9.8* 9.7*  PLT -- 331 368 415*  LABCREA -- -- -- --  CREATININE 0.48* -- 0.49* 0.53   Estimated Creatinine Clearance: 59.1 ml/min (by C-G formula based on Cr of 0.48). No results found for this basename: VANCOTROUGH:2,VANCOPEAK:2,VANCORANDOM:2,GENTTROUGH:2,GENTPEAK:2,GENTRANDOM:2,TOBRATROUGH:2,TOBRAPEAK:2,TOBRARND:2,AMIKACINPEAK:2,AMIKACINTROU:2,AMIKACIN:2, in the last 72 hours   Microbiology: Recent Results (from the past 720 hour(s))  SURGICAL PCR SCREEN     Status: Normal   Collection Time   08/15/11  6:31 PM      Component Value Range Status Comment   MRSA, PCR NEGATIVE  NEGATIVE  Final    Staphylococcus aureus NEGATIVE  NEGATIVE  Final   CULTURE, BLOOD (ROUTINE X 2)     Status: Normal (Preliminary result)   Collection Time   08/29/11  2:38 PM      Component Value Range Status Comment   Specimen Description BLOOD HAND LEFT   Final    Special Requests BOTTLES DRAWN AEROBIC AND ANAEROBIC 10CC   Final    Culture  Setup Time [id number]   Final    Culture     Final    Value:        BLOOD CULTURE RECEIVED NO GROWTH TO DATE CULTURE WILL BE HELD FOR 5 DAYS BEFORE ISSUING A FINAL NEGATIVE REPORT   Report Status PENDING   Incomplete   CULTURE, BLOOD (ROUTINE X 2)     Status: Normal (Preliminary  result)   Collection Time   08/29/11  2:45 PM      Component Value Range Status Comment   Specimen Description BLOOD HAND RIGHT   Final    Special Requests BOTTLES DRAWN AEROBIC AND ANAEROBIC 10CC   Final    Culture  Setup Time [id number]   Final    Culture     Final    Value:        BLOOD CULTURE RECEIVED NO GROWTH TO DATE CULTURE WILL BE HELD FOR 5 DAYS BEFORE ISSUING A FINAL NEGATIVE REPORT   Report Status PENDING   Incomplete   MRSA PCR SCREENING     Status: Normal   Collection Time   08/29/11  5:38 PM      Component Value Range Status Comment   MRSA by PCR NEGATIVE  NEGATIVE  Final   WOUND CULTURE     Status: Normal   Collection Time   08/29/11  5:50 PM      Component Value Range Status Comment   Specimen Description WOUND GROIN RIGHT   Final    Special Requests NONE   Final    Gram Stain     Final    Value: NO WBC SEEN     NO SQUAMOUS EPITHELIAL CELLS SEEN     NO ORGANISMS SEEN   Culture NO GROWTH 2 DAYS   Final    Report Status 09/01/2011  FINAL   Final     Anti-infectives     Start     Dose/Rate Route Frequency Ordered Stop   08/30/11 1000   vancomycin (VANCOCIN) 1,250 mg in sodium chloride 0.9 % 250 mL IVPB        1,250 mg 166.7 mL/hr over 90 Minutes Intravenous Every 24 hours 08/29/11 1756     08/29/11 1830   vancomycin (VANCOCIN) 1,250 mg in sodium chloride 0.9 % 250 mL IVPB  Status:  Discontinued        1,250 mg 166.7 mL/hr over 90 Minutes Intravenous Every 24 hours 08/29/11 1754 08/29/11 1755   08/29/11 1830  piperacillin-tazobactam (ZOSYN) IVPB 3.375 g       3.375 g 100 mL/hr over 30 Minutes Intravenous 3 times per day 08/29/11 1818     08/29/11 1430   vancomycin (VANCOCIN) IVPB 1000 mg/200 mL premix        1,000 mg 200 mL/hr over 60 Minutes Intravenous  Once 08/29/11 1427 08/29/11 1609   08/29/11 1430  piperacillin-tazobactam (ZOSYN) IVPB 3.375 g       3.375 g 12.5 mL/hr over 240 Minutes Intravenous  Once 08/29/11 1427             Assessment:  83  YOF s/p right axillofemoral bypass graft surgery on 2/13, presents on 2/26 with right groin wound infection. Patient started on IV vancomycin and zosyn after cultures obtained.  Renal function has been stable and wound healing well per MD note.  All cultures negative.  Goals of Therapy: Vanc trough:  10-15 mcg/mL   Plan: - Continue vanc 1250mg  IV Q24H - Change Zosyn 3.375gm IV Q8H (4 hr infusion and not 30 min) - Monitor renal fxn, clinical course - Vanc trough tomorrow - F/U deescalation of abx    Phillips Climes, PharmD 09/01/2011,12:28 PM

## 2011-09-02 LAB — VANCOMYCIN, TROUGH: Vancomycin Tr: 15.3 ug/mL (ref 10.0–20.0)

## 2011-09-02 LAB — GLUCOSE, CAPILLARY
Glucose-Capillary: 115 mg/dL — ABNORMAL HIGH (ref 70–99)
Glucose-Capillary: 101 mg/dL — ABNORMAL HIGH (ref 70–99)

## 2011-09-02 MED ORDER — VANCOMYCIN HCL IN DEXTROSE 1-5 GM/200ML-% IV SOLN
1000.0000 mg | INTRAVENOUS | Status: DC
  Administered 2011-09-03 – 2011-09-04 (×2): 1000 mg via INTRAVENOUS
  Filled 2011-09-02 (×3): qty 200

## 2011-09-02 NOTE — Progress Notes (Signed)
ANTIBIOTIC CONSULT NOTE - FOLLOW UP  Pharmacy Consult:  Vanc/Zosyn D#5 Indication: right groin wound infection  No Known Allergies  Patient Measurements: Height: 5\' 7"  (170.2 cm) Weight: 166 lb 14.2 oz (75.7 kg) IBW/kg (Calculated) : 61.6   Vital Signs: Temp: 97.9 F (36.6 C) (03/02 0558) Temp src: Oral (03/02 0558) BP: 123/69 mmHg (03/02 0558) Pulse Rate: 79  (03/02 0558) Intake/Output from previous day: 03/01 0701 - 03/02 0700 In: 1060 [P.O.:960; IV Piggyback:100] Out: 500 [Urine:500] Intake/Output from this shift: Total I/O In: 240 [P.O.:240] Out: 151 [Urine:150; Stool:1]  Labs:  Basename 08/31/11 0815 08/31/11 0700  WBC -- 6.4  HGB -- 9.3*  PLT -- 331  LABCREA -- --  CREATININE 0.48* --   Estimated Creatinine Clearance: 58.5 ml/min (by C-G formula based on Cr of 0.48).  Basename 09/02/11 1120  VANCOTROUGH 15.3  VANCOPEAK --  VANCORANDOM --  GENTTROUGH --  GENTPEAK --  GENTRANDOM --  TOBRATROUGH --  TOBRAPEAK --  TOBRARND --  AMIKACINPEAK --  AMIKACINTROU --  AMIKACIN --     Microbiology: Recent Results (from the past 720 hour(s))  SURGICAL PCR SCREEN     Status: Normal   Collection Time   08/15/11  6:31 PM      Component Value Range Status Comment   MRSA, PCR NEGATIVE  NEGATIVE  Final    Staphylococcus aureus NEGATIVE  NEGATIVE  Final   CULTURE, BLOOD (ROUTINE X 2)     Status: Normal (Preliminary result)   Collection Time   08/29/11  2:38 PM      Component Value Range Status Comment   Specimen Description BLOOD HAND LEFT   Final    Special Requests BOTTLES DRAWN AEROBIC AND ANAEROBIC 10CC   Final    Culture  Setup Time [id number]   Final    Culture     Final    Value:        BLOOD CULTURE RECEIVED NO GROWTH TO DATE CULTURE WILL BE HELD FOR 5 DAYS BEFORE ISSUING A FINAL NEGATIVE REPORT   Report Status PENDING   Incomplete   CULTURE, BLOOD (ROUTINE X 2)     Status: Normal (Preliminary result)   Collection Time   08/29/11  2:45 PM   Component Value Range Status Comment   Specimen Description BLOOD HAND RIGHT   Final    Special Requests BOTTLES DRAWN AEROBIC AND ANAEROBIC 10CC   Final    Culture  Setup Time [id number]   Final    Culture     Final    Value:        BLOOD CULTURE RECEIVED NO GROWTH TO DATE CULTURE WILL BE HELD FOR 5 DAYS BEFORE ISSUING A FINAL NEGATIVE REPORT   Report Status PENDING   Incomplete   MRSA PCR SCREENING     Status: Normal   Collection Time   08/29/11  5:38 PM      Component Value Range Status Comment   MRSA by PCR NEGATIVE  NEGATIVE  Final   WOUND CULTURE     Status: Normal   Collection Time   08/29/11  5:50 PM      Component Value Range Status Comment   Specimen Description WOUND GROIN RIGHT   Final    Special Requests NONE   Final    Gram Stain     Final    Value: NO WBC SEEN     NO SQUAMOUS EPITHELIAL CELLS SEEN     NO ORGANISMS SEEN   Culture  NO GROWTH 2 DAYS   Final    Report Status 09/01/2011 FINAL   Final     Anti-infectives     Start     Dose/Rate Route Frequency Ordered Stop   09/01/11 1400   piperacillin-tazobactam (ZOSYN) IVPB 3.375 g        3.375 g 12.5 mL/hr over 240 Minutes Intravenous 3 times per day 09/01/11 1238     08/30/11 1000   vancomycin (VANCOCIN) 1,250 mg in sodium chloride 0.9 % 250 mL IVPB        1,250 mg 166.7 mL/hr over 90 Minutes Intravenous Every 24 hours 08/29/11 1756     08/29/11 1830   vancomycin (VANCOCIN) 1,250 mg in sodium chloride 0.9 % 250 mL IVPB  Status:  Discontinued        1,250 mg 166.7 mL/hr over 90 Minutes Intravenous Every 24 hours 08/29/11 1754 08/29/11 1755   08/29/11 1830   piperacillin-tazobactam (ZOSYN) IVPB 3.375 g  Status:  Discontinued        3.375 g 100 mL/hr over 30 Minutes Intravenous 3 times per day 08/29/11 1818 09/01/11 1238   08/29/11 1430   vancomycin (VANCOCIN) IVPB 1000 mg/200 mL premix        1,000 mg 200 mL/hr over 60 Minutes Intravenous  Once 08/29/11 1427 08/29/11 1609   08/29/11 1430    piperacillin-tazobactam (ZOSYN) IVPB 3.375 g  Status:  Discontinued        3.375 g 12.5 mL/hr over 240 Minutes Intravenous  Once 08/29/11 1427 09/01/11 1237           Assessment:  Mackenzie [name] s/p right axillofemoral bypass graft surgery on 2/13, presents on 2/26 with right groin wound infection. Patient started on IV vancomycin and zosyn after cultures obtained.  Renal function has been stable and wound healing well per MD note.  All cultures negative.  Vanc trough acceptable at 15.3 mcg/mL, but it was drawn two hours late, so expect true trough to be above goal.   Goals of Therapy: Vanc trough:  10-15 mcg/mL   Plan: - Decrease vanc to 1gm IV Q24H (note due 09/05/11) - Continue Zosyn 3.375gm IV Q8H (4 hr infusion) - Monitor renal fxn, clinical course - F/U deescalation of abx - BMET on 09/04/11    Phillips Climes, PharmD 09/02/2011,12:16 PM

## 2011-09-02 NOTE — Progress Notes (Addendum)
[facility] [place]  Progress Note Bypass Surgery  History of Present Illness  Mackenzie [name] is a 78 y.o. female who is S/P  Right axillo femoral bypass who was admitted with small right femoral wound dehiscence.  Pt is doing well with no complaints  Wound culture Status: Final result MyChart: Not Released      Component  Value    Specimen Description  WOUND GROIN RIGHT    Special Requests  NONE    Gram Stain  NO WBC SEEN NO SQUAMOUS EPITHELIAL CELLS SEEN NO ORGANISMS SEEN    Culture  NO GROWTH 2 DAYS    Report Status  09/01/2011 FINAL     Physical Examination  BP Readings from Last 3 Encounters:  09/02/11 123/69  08/29/11 116/96  08/21/11 150/67   Temp Readings from Last 3 Encounters:  09/02/11 97.9 F (36.6 C) Oral  08/21/11 97.9 F (36.6 C) Oral  08/14/11 98.7 F (37.1 C) Oral   SpO2 Readings from Last 3 Encounters:  09/02/11 97%  08/29/11 100%  08/21/11 97%   Pulse Readings from Last 3 Encounters:  09/02/11 79  08/29/11 99  08/21/11 95    Pt is A&O x 3 right lower extremity: Incision at groin with good granulation tissue, debrided panus wound of eschar. Limb is warm; with good color  Assessment: Pt. Doing well Post-op pain is controlled Wounds are healing well Wound culture negative for organisms  Plan: PT/OT for ambulation Continue wound care as ordered  [name] [phone] 09/02/2011 10:41 AM       Agree with above Right wound granulating nicely 3+ axillofemoral graft pulse palpable Need to work on nutrition

## 2011-09-03 LAB — BASIC METABOLIC PANEL
BUN: 12 mg/dL (ref 6–23)
GFR calc Af Amer: >90 mL/min (ref >90)
GFR calc non Af Amer: 78 mL/min — ABNORMAL LOW (ref >90)
Potassium: 3 mEq/L — ABNORMAL LOW (ref 3.5–5.1)

## 2011-09-03 LAB — GLUCOSE, CAPILLARY: Glucose-Capillary: 112 mg/dL — ABNORMAL HIGH (ref 70–99)

## 2011-09-03 MED ORDER — BACID PO TABS
2.0000 | ORAL_TABLET | Freq: Three times a day (TID) | ORAL | Status: DC
  Administered 2011-09-03 – 2011-09-06 (×9): 2 via ORAL
  Filled 2011-09-03 (×14): qty 2

## 2011-09-03 NOTE — Progress Notes (Addendum)
[facility] [place]  Progress Note Bypass Surgery  History of Present Illness  Mackenzie [name] is a 78 y.o. female who is S/P  Right axillo-fem bypass who has open right groin wound which is not growing any organisms per culture. She is on Vanc and Zosyn.  She C/O stomach upset sec. To diarrhea this am.  Physical Examination  BP Readings from Last 3 Encounters:  09/03/11 124/55  08/29/11 116/96  08/21/11 150/67   Temp Readings from Last 3 Encounters:  09/03/11 97.8 F (36.6 C) Oral  08/21/11 97.9 F (36.6 C) Oral  08/14/11 98.7 F (37.1 C) Oral   SpO2 Readings from Last 3 Encounters:  09/03/11 97%  08/29/11 100%  08/21/11 97%   Pulse Readings from Last 3 Encounters:  09/03/11 79  08/29/11 99  08/21/11 95    Pt is A&O x 3 right lower extremity: Incision/s is/are clean,dry.intact, and  healing without hematoma, erythema or drainage Groin wound is granulating in nicely Limb is warm; with good color  Assessment:/Plan Pt. Doing well except Diarrhea this am ? Need for antibiotics as cultures of wound are negative. Pt does have gortex graft Post-op pain is controlled Wounds are healing well Will send C. Diff this am and check K+ with BMET PT/OT for ambulation Continue wound care as ordered Probiotics ordered  Mackenzie [name] [phone] 09/03/2011 9:00 AM        Right groin wound healing nicely Started having diarrhea yesterday Cultures pending-on vancomycin and Zosyn

## 2011-09-04 LAB — GLUCOSE, CAPILLARY
Glucose-Capillary: 93 mg/dL (ref 70–99)
Glucose-Capillary: 88 mg/dL (ref 70–99)
Glucose-Capillary: 92 mg/dL (ref 70–99)
Glucose-Capillary: 96 mg/dL (ref 70–99)

## 2011-09-04 LAB — CULTURE, BLOOD (ROUTINE X 2)
Culture  Setup Time: 201302262121
Culture: NO GROWTH

## 2011-09-04 MED ORDER — FLUCONAZOLE 100 MG PO TABS
100.0000 mg | ORAL_TABLET | Freq: Every day | ORAL | Status: AC
  Administered 2011-09-05: 100 mg via ORAL
  Filled 2011-09-04: qty 1

## 2011-09-04 MED ORDER — FLUCONAZOLE 200 MG PO TABS
200.0000 mg | ORAL_TABLET | Freq: Once | ORAL | Status: AC
  Administered 2011-09-04: 200 mg via ORAL
  Filled 2011-09-04: qty 1

## 2011-09-04 MED ORDER — MOXIFLOXACIN HCL 400 MG PO TABS
400.0000 mg | ORAL_TABLET | Freq: Every day | ORAL | Status: DC
  Administered 2011-09-04: 400 mg via ORAL
  Filled 2011-09-04 (×2): qty 1

## 2011-09-04 MED ORDER — METRONIDAZOLE 500 MG PO TABS
500.0000 mg | ORAL_TABLET | Freq: Three times a day (TID) | ORAL | Status: DC
  Administered 2011-09-04 (×3): 500 mg via ORAL
  Filled 2011-09-04 (×6): qty 1

## 2011-09-04 NOTE — Progress Notes (Addendum)
[facility] Progress Note  09/04/2011 7:34 AM HD 7  Subjective:  Says her stomach feels a little better  Afebrile x 24hrs  94%RA Filed Vitals:   09/04/11 0510  BP: 146/58  Pulse: 85  Temp: 98.4 F (36.9 C)  Resp: 18    Physical Exam: Incisions:  Right groin incision with good granulation tissue; there is yeast around the incision.  CBC    Component Value Date/Time   WBC 6.4 08/31/2011 0700   RBC 4.17 08/31/2011 0700   HGB 9.3* 08/31/2011 0700   HCT 31.1* 08/31/2011 0700   PLT 331 08/31/2011 0700   MCV 74.6* 08/31/2011 0700   MCH 22.3* 08/31/2011 0700   MCHC 29.9* 08/31/2011 0700   RDW 19.5* 08/31/2011 0700   LYMPHSABS 1.1 08/29/2011 1431   MONOABS 0.6 08/29/2011 1431   EOSABS 0.0 08/29/2011 1431   BASOSABS 0.1 08/29/2011 1431    BMET    Component Value Date/Time   NA 137 09/03/2011 0930   K 3.0* 09/03/2011 0930   CL 99 09/03/2011 0930   CO2 31 09/03/2011 0930   GLUCOSE 134* 09/03/2011 0930   BUN 12 09/03/2011 0930   CREATININE 0.74 09/03/2011 0930   CREATININE 0.58 05/24/2011 1046   CALCIUM 8.7 09/03/2011 0930   GFRNONAA 78* 09/03/2011 0930   GFRAA >90 09/03/2011 0930    INR    Component Value Date/Time   INR 1.09 08/29/2011 1749     Intake/Output Summary (Last 24 hours) at 09/04/11 0734 Last data filed at 09/04/11 0400  Gross per 24 hour  Intake    120 ml  Output    300 ml  Net   -180 ml     Assessment/Plan:  78 y.o. female is s/p s/p right axillobifemoral bypass re-admitted with right groin wound infection HD 7 -  C. Diff results are pending - wound cx No growth. ? D/c ABx-will d/w Dr. Myra Gianotti - plan for SNF -Will empirically start Flagyl -change to po ABx  Newton Pigg, PA-C [facility] [phone] 09/04/2011 7:34 AM

## 2011-09-04 NOTE — Progress Notes (Signed)
CSW met with pt to address discharge plan to [facility]. Pt is agreeable to return when medically ready. CSW spoke with pt's niece, Freddy Jaksch, who is also agreeable to discharge to [facility]. Pt has been evaluated by PT. CSW will update FL2. CSW will continue to follow to facilitate discharge to SNF when medically ready.  Dede Query, MSW, Theresia Majors [phone]

## 2011-09-04 NOTE — Evaluation (Signed)
Physical Therapy Evaluation Patient Details Name: ONNA NODAL MRN: [id number] DOB: 20-Jan-1928 Today's Date: 09/04/2011  Problem List:  Patient Active Problem List  Diagnoses  . THYROID NODULE  . DIABETES MELLITUS II, UNCOMPLICATED  . MACULAR DEGENERATION  . HYPERTENSION, BENIGN SYSTEMIC  . PSORIASIS  . WALKING DIFFICULTY  . INCONTINENCE, MIXED, URGE/STRESS  . Osteoporosis, senile  . Depression  . Dementia  . Wrist pain  . Postoperative anemia due to acute blood loss  . DM (diabetes mellitus)  . PVD (peripheral vascular disease)  . HTN (hypertension)  . GERD (gastroesophageal reflux disease)  . CAD (coronary artery disease)  . Hypokalemia  . Abnormal TSH  . Cellulitis and abscess of trunk    Past Medical History:  Past Medical History  Diagnosis Date  . Delirium 12/10/08    hospitalized  . Pelvic fracture 07/01/09    hospitalized  . Coronary artery disease   . Femoral neck fracture   . Anemia   . Hypertension   . Diabetes mellitus    Past Surgical History:  Past Surgical History  Procedure Date  . Cholecystectomy   . Cataract extraction   . Axillary-femoral bypass graft 08/15/2011    Procedure: BYPASS GRAFT AXILLA-BIFEMORAL;  Surgeon: [name], MD;  Location: [facility] OR;  Service: Vascular;  Laterality: Right;  Embolectomy of Right Femoral Artery with Right  Axilla Femoral Bypass Graft.    PT Assessment/Plan/Recommendation PT Assessment Clinical Impression Statement: Pt. is an 78 y/o female admitted for R groin infection.  Pt. demonstrates significantly decreased functional mobility and activity tolerance.  Will benefit from PT to maximize functional mobility and decrease caregiver burden. PT Recommendation/Assessment: Patient will need skilled PT in the acute care venue PT Problem List: Decreased strength;Decreased activity tolerance;Decreased balance;Decreased mobility;Decreased cognition;Decreased knowledge of use of DME;Decreased safety awareness;Decreased  knowledge of precautions;Pain Barriers to Discharge: None PT Therapy Diagnosis : Difficulty walking;Generalized weakness;Acute pain PT Plan PT Frequency: Min 2X/week PT Treatment/Interventions: DME instruction;Gait training;Functional mobility training;Therapeutic activities;Therapeutic exercise;Balance training;Patient/family education PT Recommendation Follow Up Recommendations: Skilled nursing facility Equipment Recommended: Defer to next venue PT Goals  Acute Rehab PT Goals PT Goal Formulation: Patient unable to participate in goal setting Time For Goal Achievement: 2 weeks Pt will go Supine/Side to Sit: with mod assist;with rail PT Goal: Supine/Side to Sit - Progress: Goal set today Pt will go Sit to Supine/Side: with supervision;with rail PT Goal: Sit to Supine/Side - Progress: Goal set today Pt will go Sit to Stand: with mod assist;from elevated surface;with upper extremity assist PT Goal: Sit to Stand - Progress: Goal set today Pt will go Stand to Sit: with mod assist;with upper extremity assist PT Goal: Stand to Sit - Progress: Goal set today Pt will Transfer Bed to Chair/Chair to Bed: with +2 total assist;Other (comment) (pt. >40%) PT Transfer Goal: Bed to Chair/Chair to Bed - Progress: Goal set today Pt will Perform Home Exercise Program: with min assist PT Goal: Perform Home Exercise Program - Progress: Goal set today  PT Evaluation Precautions/Restrictions  Precautions Precautions: Fall Required Braces or Orthoses: No Restrictions Weight Bearing Restrictions: No Prior Functioning  Home Living Lives With: Alone Receives Help From: Personal care attendant Type of Home: House Home Layout: One level Home Access: Stairs to enter Entrance Stairs-Rails: Can reach both Entrance Stairs-Number of Steps: 3 Bathroom Shower/Tub: Psychologist, counselling;Door [employer] Toilet: Standard Bathroom Accessibility: Yes How Accessible: Accessible via walker Home Adaptive Equipment: Raised  toilet seat with rails;Shower chair with back;Straight cane;Walker - standard  Prior Function Level of Independence: Needs assistance with tranfers;Needs assistance with gait;Needs assistance with homemaking (pt. from SNF) Able to Take Stairs?: Yes Driving: No Vocation: Retired Producer, television/film/video: Awake/alert Overall Cognitive Status: History of cognitive impairments History of Cognitive Impairment: Appears at baseline functioning Orientation Level: Oriented to person;Oriented to place;Oriented to time;Disoriented to situation Sensation/Coordination Sensation Light Touch: Not tested Stereognosis: Not tested Hot/Cold: Not tested Proprioception: Not tested Coordination Gross Motor Movements are Fluid and Coordinated: Not tested Fine Motor Movements are Fluid and Coordinated: No Extremity Assessment RUE Assessment RUE Assessment: Not tested LUE Assessment LUE Assessment: Not tested RLE Assessment RLE Assessment: Exceptions to [facility] RLE Strength RLE Overall Strength: Deficits (0/5 dorsiflexion; grossly 3/5 functionally elsewhere) LLE Assessment LLE Assessment: Exceptions to [facility] LLE Strength LLE Overall Strength: Deficits (grossly 3+/5 for functional tasks) Mobility (including Balance) Bed Mobility Bed Mobility: Yes Rolling Right: 5: Supervision;With rail Supine to Sit: 1: +1 Total assist;With rails;HOB elevated (Comment degrees) (10 degrees) Supine to Sit Details (indicate cue type and reason): cues for sequencing Sit to Supine: With rail;HOB flat;3: Mod assist Sit to Supine - Details (indicate cue type and reason): mod A for lower extremities only Scooting to HOB: 1: +2 Total assist;Patient percentage (comment) (pt. < 10%) Transfers Transfers: Yes Sit to Stand: 1: +1 Total assist;From elevated surface;With upper extremity assist;From bed Sit to Stand Details (indicate cue type and reason): strong posterior lean needing cues to shift wt. anteriorly Stand to  Sit: 1: +1 Total assist;To bed Stand to Sit Details: total A to control descent Ambulation/Gait Ambulation/Gait: No Stairs: No Wheelchair Mobility Wheelchair Mobility: No  Posture/Postural Control Posture/Postural Control: No significant limitations Balance Balance Assessed: Yes Static Standing Balance Static Standing - Level of Assistance: 2: Max assist Exercise    End of Session PT - End of Session Equipment Utilized During Treatment: Gait belt Activity Tolerance: Patient limited by pain Patient left: in bed;with bed alarm set Nurse Communication: Mobility status for transfers General Behavior During Session: [facility] for tasks performed Cognition: Impaired, at baseline  Feltis, Nicki Reaper 09/04/2011, 2:53 PM  Judeth Cornfield K. Feltis, PT, DPT [phone]

## 2011-09-04 NOTE — Progress Notes (Signed)
Pt was having loose stools yesterday. Stool sample sent down for C. Diff. Results still pending. Pt placed on contact isolation per hospital protocol until results come back.   Alfonso Ellis, RN

## 2011-09-05 LAB — CLOSTRIDIUM DIFFICILE BY PCR: Toxigenic C. Difficile by PCR: NEGATIVE

## 2011-09-05 LAB — GLUCOSE, CAPILLARY
Glucose-Capillary: 131 mg/dL — ABNORMAL HIGH (ref 70–99)
Glucose-Capillary: 93 mg/dL (ref 70–99)
Glucose-Capillary: 83 mg/dL (ref 70–99)

## 2011-09-05 LAB — CLOSTRIDIUM DIFFICILE TOXIN

## 2011-09-05 MED ORDER — OXYCODONE HCL 5 MG PO TABS: 5.0000 mg | ORAL_TABLET | ORAL | Status: DC | PRN

## 2011-09-05 MED ORDER — METRONIDAZOLE 500 MG PO TABS
500.0000 mg | ORAL_TABLET | Freq: Three times a day (TID) | ORAL | Status: DC
  Administered 2011-09-05 – 2011-09-06 (×3): 500 mg via ORAL
  Filled 2011-09-05 (×5): qty 1

## 2011-09-05 MED ORDER — POTASSIUM CHLORIDE CRYS ER 20 MEQ PO TBCR
40.0000 meq | EXTENDED_RELEASE_TABLET | Freq: Once | ORAL | Status: AC
  Administered 2011-09-05: 40 meq via ORAL

## 2011-09-05 NOTE — Progress Notes (Addendum)
[facility] Progress Note  09/05/2011 7:14 AM  HD 8  Subjective:  Not feeling well this am.  Stomach upset, but no diarrhea.   Filed Vitals:   09/05/11 0526  BP: 134/58  Pulse: 78  Temp: 97.3 F (36.3 C)  Resp: 18    Physical Exam: Incisions:  Right groin wound with good granulation tissue.   CBC    Component Value Date/Time   WBC 6.4 08/31/2011 0700   RBC 4.17 08/31/2011 0700   HGB 9.3* 08/31/2011 0700   HCT 31.1* 08/31/2011 0700   PLT 331 08/31/2011 0700   MCV 74.6* 08/31/2011 0700   MCH 22.3* 08/31/2011 0700   MCHC 29.9* 08/31/2011 0700   RDW 19.5* 08/31/2011 0700   LYMPHSABS 1.1 08/29/2011 1431   MONOABS 0.6 08/29/2011 1431   EOSABS 0.0 08/29/2011 1431   BASOSABS 0.1 08/29/2011 1431    BMET    Component Value Date/Time   NA 137 09/03/2011 0930   K 3.0* 09/03/2011 0930   CL 99 09/03/2011 0930   CO2 31 09/03/2011 0930   GLUCOSE 134* 09/03/2011 0930   BUN 12 09/03/2011 0930   CREATININE 0.74 09/03/2011 0930   CREATININE 0.58 05/24/2011 1046   CALCIUM 8.7 09/03/2011 0930   GFRNONAA 78* 09/03/2011 0930   GFRAA >90 09/03/2011 0930    INR    Component Value Date/Time   INR 1.09 08/29/2011 1749     Intake/Output Summary (Last 24 hours) at 09/05/11 0714 Last data filed at 09/04/11 1300  Gross per 24 hour  Intake    290 ml  Output   1001 ml  Net   -711 ml    C. Diff negative   Assessment/Plan:  78 y.o. female is s/p right axillobifemoral bypass re-admitted with right groin wound infection HD 8 -d/c flagyl as C. Diff was negative -d/c to SNF today -d/c Avelox and  Vanc/Zosyn  per Dr. Myra Gianotti -continue BID dressing changes (wet to dry) -d/c diflucan after today's dose. -supplement K+   Mackenzie Pigg, PA-C [facility] [phone] 09/05/2011 7:14 AM   Still with freq watery diarrhea Repeat C.Diff Continue Flagyl Do not discharge  Mackenzie [name]

## 2011-09-05 NOTE — Discharge Summary (Signed)
[facility] Discharge Summary  Mackenzie [name] 1928/02/08 78 y.o. female  [id number]  Admission Date: 08/29/2011  Discharge Date: 09/05/11  Physician: [name], MD  Admission Diagnosis: Other and unspecified hyperlipidemia [272.4] Essential hypertension, benign [401.1] Wound infection [958.3] wound inf   HPI:   This is a 78 y.o. female recently had a right axillobifemoral bypass by Dr. Myra Gianotti for limb salvage. She was at [facility]. She returns today with right groin wound problems.   Hospital Course:  The patient was admitted to the hospital and started on IV ABx for right groin wound infection.  Cultures of the wound were taken and were negative.  PT and OT were ordered for the pt while in the hospital.  She did have diarrhea and C. Diff was sent to the lab and results returned negative.  She did have some yeast around the incision and was started on diflucan for this.  She did have some watery diarrhea and was empirically started on flagyl.  Her C. Diff came back negative and therefore her flagyl was stopped.  The next day, she had some watery stools and was started back on the flagyl and her ABx d/c'd.  Her C. Diff came back negative once again.  We will continue her flagyl empirically for 7 days.  She did have som hypokalemia that is supplemented before discharge. The remainder of the hospital course consisted of increasing ambulation and increasing intake of solids without difficulty.   CBC    Component Value Date/Time   WBC 5.0 09/06/2011 0500   RBC 3.55* 09/06/2011 0500   HGB 8.0* 09/06/2011 0500   HCT 26.7* 09/06/2011 0500   PLT 306 09/06/2011 0500   MCV 75.2* 09/06/2011 0500   MCH 22.5* 09/06/2011 0500   MCHC 30.0 09/06/2011 0500   RDW 19.3* 09/06/2011 0500   LYMPHSABS 1.1 08/29/2011 1431   MONOABS 0.6 08/29/2011 1431   EOSABS 0.0 08/29/2011 1431   BASOSABS 0.1 08/29/2011 1431    BMET    Component Value Date/Time   NA 139  09/06/2011 0500   K 2.9* 09/06/2011 0500   CL 105 09/06/2011 0500   CO2 26 09/06/2011 0500   GLUCOSE 92 09/06/2011 0500   BUN 7 09/06/2011 0500   CREATININE 0.62 09/06/2011 0500   CREATININE 0.58 05/24/2011 1046   CALCIUM 8.5 09/06/2011 0500   GFRNONAA 82* 09/06/2011 0500   GFRAA >90 09/06/2011 0500      Discharge Instructions:   The patient is discharged to home with extensive instructions on wound care and progressive ambulation.  They are instructed not to drive or perform any heavy lifting until returning to see the physician in his office.  Dressing changes should be wet to dry dressing changes to right groin bid.  Discharge Orders    Future Orders Please Complete By Expires   Resume previous diet      Call MD for:  temperature >100.5      Call MD for:  redness, tenderness, or signs of infection (pain, swelling, bleeding, redness, odor or green/yellow discharge around incision site)      Call MD for:  severe or increased pain, loss or decreased feeling  in affected limb(s)      Discharge wound care:      Comments:   Wet to dry dressing to right groin wound bid      Discharge Diagnosis:  Other and unspecified hyperlipidemia [272.4] Essential hypertension, benign [401.1] Wound infection [  958.3] wound inf  Secondary Diagnosis: Patient Active Problem List  Diagnoses  . THYROID NODULE  . DIABETES MELLITUS II, UNCOMPLICATED  . MACULAR DEGENERATION  . HYPERTENSION, BENIGN SYSTEMIC  . PSORIASIS  . WALKING DIFFICULTY  . INCONTINENCE, MIXED, URGE/STRESS  . Osteoporosis, senile  . Depression  . Dementia  . Wrist pain  . Postoperative anemia due to acute blood loss  . DM (diabetes mellitus)  . PVD (peripheral vascular disease)  . HTN (hypertension)  . GERD (gastroesophageal reflux disease)  . CAD (coronary artery disease)  . Hypokalemia  . Abnormal TSH  . Cellulitis and abscess of trunk   Past Medical History  Diagnosis Date  . Delirium 12/10/08    hospitalized  . Pelvic  fracture 07/01/09    hospitalized  . Coronary artery disease   . Femoral neck fracture   . Anemia   . Hypertension   . Diabetes mellitus      Mackenzie [name]  Home Medication Instructions [id number]   Printed on:09/06/11 [id number]  Medication Information                    simvastatin (ZOCOR) 40 MG tablet Take 1 tablet (40 mg total) by mouth at bedtime.           metoprolol (LOPRESSOR) 50 MG tablet Take 1 tablet (50 mg total) by mouth 2 (two) times daily.           metFORMIN (GLUCOPHAGE) 1000 MG tablet Take 1 tablet (1,000 mg total) by mouth 2 (two) times daily with a meal.           hydrochlorothiazide (HYDRODIURIL) 25 MG tablet Take 1 tablet (25 mg total) by mouth daily.           nitroGLYCERIN (NITRODUR - DOSED IN MG/24 HR) 0.3 mg/hr Place 1 patch onto the skin daily.           donepezil (ARICEPT) 5 MG tablet Take 5 mg by mouth at bedtime.           sertraline (ZOLOFT) 50 MG tablet Take 50 mg by mouth at bedtime.           OVER THE COUNTER MEDICATION Take 1 capsule by mouth 2 (two) times daily. Vitamin B complex with iron intrinsic factor           magnesium hydroxide (MILK OF MAGNESIA) 400 MG/5ML suspension Take 30 mLs by mouth at bedtime as needed.           alum & mag hydroxide-simeth ([place] DOUBLE-STRENGTH) 400-400-40 MG/5ML suspension Take 30 mLs by mouth every 6 (six) hours as needed. dyspepsia           bisacodyl (DULCOLAX) 10 MG suppository Place 10 mg rectally as needed. For constipation           insulin aspart (NOVOLOG) 100 UNIT/ML injection Inject 2-8 Units into the skin 4 (four) times daily - after meals and at bedtime.           enoxaparin (LOVENOX) 30 MG/0.3ML SOLN Inject 30 mg into the skin every 12 (twelve) hours. For 24 days starting on 08/02/11           oxyCODONE (OXY IR/ROXICODONE) 5 MG immediate release tablet Take 1 tablet (5 mg total) by mouth every 4 (four) hours as needed. For severe pain #30 NR          metroNIDAZOLE (FLAGYL) 500  MG tablet Take 1 tablet (500 mg total) by mouth 3 (three) times  daily. X 7 days            Disposition: SNF  Patient's condition: is Good  Follow up: 1. Dr. Myra Gianotti 1 week.   Newton Pigg, PA-C [facility] [phone] 09/05/2011  7:33 AM

## 2011-09-05 NOTE — Progress Notes (Signed)
Per care coordinator, pt's discharge will be delayed until tomorrow. [facility]F and pt's niece are aware. CSW will facilitate discharge to [facility] 09/06/11.  Dede Query, MSW, The[facility]ors [phone]

## 2011-09-06 LAB — GLUCOSE, CAPILLARY
Glucose-Capillary: 90 mg/dL (ref 70–99)
Glucose-Capillary: 103 mg/dL — ABNORMAL HIGH (ref 70–99)

## 2011-09-06 LAB — CBC
MCH: 22.5 pg — ABNORMAL LOW (ref 26.0–34.0)
MCHC: 30 g/dL (ref 30.0–36.0)
MCV: 75.2 fL — ABNORMAL LOW (ref 78.0–100.0)
Platelets: 306 10*3/uL (ref 150–400)
RDW: 19.3 % — ABNORMAL HIGH (ref 11.5–15.5)

## 2011-09-06 LAB — BASIC METABOLIC PANEL
Calcium: 8.5 mg/dL (ref 8.4–10.5)
Creatinine, Ser: 0.62 mg/dL (ref 0.50–1.10)
GFR calc non Af Amer: 82 mL/min — ABNORMAL LOW (ref >90)
Sodium: 139 mEq/L (ref 135–145)

## 2011-09-06 MED ORDER — POTASSIUM CHLORIDE CRYS ER 20 MEQ PO TBCR
40.0000 meq | EXTENDED_RELEASE_TABLET | Freq: Once | ORAL | Status: AC
  Administered 2011-09-06: 40 meq via ORAL
  Filled 2011-09-06: qty 2

## 2011-09-06 MED ORDER — METRONIDAZOLE 500 MG PO TABS: 500.0000 mg | ORAL_TABLET | Freq: Three times a day (TID) | ORAL | Status: DC

## 2011-09-06 NOTE — Discharge Summary (Signed)
Agrree with above  Groin looking much better Diarrhea resolved Stable for discharge  [name]

## 2011-09-06 NOTE — Progress Notes (Addendum)
[facility] Progress Note  09/06/2011 7:43 AM HD 9  Subjective:  Feels much better and ready to go.  Eating breakfast.  No more watery stools.   Filed Vitals:   09/06/11 0515  BP: 148/57  Pulse: 89  Temp: 98.1 F (36.7 C)  Resp: 18    Physical Exam: Incisions:  Right groin with good granulation tissue. Extremities:  Right leg warm and well perfused.  CBC    Component Value Date/Time   WBC 5.0 09/06/2011 0500   RBC 3.55* 09/06/2011 0500   HGB 8.0* 09/06/2011 0500   HCT 26.7* 09/06/2011 0500   PLT 306 09/06/2011 0500   MCV 75.2* 09/06/2011 0500   MCH 22.5* 09/06/2011 0500   MCHC 30.0 09/06/2011 0500   RDW 19.3* 09/06/2011 0500   LYMPHSABS 1.1 08/29/2011 1431   MONOABS 0.6 08/29/2011 1431   EOSABS 0.0 08/29/2011 1431   BASOSABS 0.1 08/29/2011 1431    BMET    Component Value Date/Time   NA 139 09/06/2011 0500   K 2.9* 09/06/2011 0500   CL 105 09/06/2011 0500   CO2 26 09/06/2011 0500   GLUCOSE 92 09/06/2011 0500   BUN 7 09/06/2011 0500   CREATININE 0.62 09/06/2011 0500   CREATININE 0.58 05/24/2011 1046   CALCIUM 8.5 09/06/2011 0500   GFRNONAA 82* 09/06/2011 0500   GFRAA >90 09/06/2011 0500    INR    Component Value Date/Time   INR 1.09 08/29/2011 1749     Intake/Output Summary (Last 24 hours) at 09/06/11 0743 Last data filed at 09/06/11 1610  Gross per 24 hour  Intake    900 ml  Output    100 ml  Net    800 ml     Assessment/Plan:  78 y.o. female is s/p right axillobifemoral bypass re-admitted with right groin wound infection  HD 9 -doing well this am without watery diarrhea.  Will continue flagyl empirically for 7 days. -C. Diff negative. -d/c IVF -d/c to SNF today -hypokalemia-supplement  Newton Pigg, PA-C [facility] [phone] 09/06/2011 7:43 AM   Agree with the above  Durene Cal

## 2011-09-06 NOTE — Progress Notes (Signed)
LUA PICC d/c'ed per order for d/c to home.  Tip intact.  Vaseline pressure dressing applied to site.  Instructions given on site care to keep covered 48 hours, apply pressure if bleeding noted and signs of infection.  Verbalized understanding via teach back method.

## 2011-09-06 NOTE — Progress Notes (Signed)
Pt is ready for discharge to [facility] today. [facility] has received discharge paperwork and is ready to accept pt. Pt and family are agreeable to discharge plan. [facility] will provide transportation. CSW is signing off.   Dede Query, MSW, Theresia Majors [phone]

## 2011-09-08 ENCOUNTER — Encounter: Payer: Self-pay | Admitting: Surgery

## 2011-09-10 ENCOUNTER — Inpatient Hospital Stay (HOSPITAL_COMMUNITY)
Admission: EM | Admit: 2011-09-10 | Discharge: 2011-09-13 | DRG: 392 | Disposition: A | Discharge status: Skilled Nursing Facility | Payer: Medicare Other | Attending: Family Medicine | Admitting: Family Medicine

## 2011-09-10 ENCOUNTER — Emergency Department (HOSPITAL_COMMUNITY): Payer: Medicare Other

## 2011-09-10 ENCOUNTER — Encounter (HOSPITAL_COMMUNITY): Payer: Self-pay

## 2011-09-10 DIAGNOSIS — Y838 Other surgical procedures as the cause of abnormal reaction of the patient, or of later complication, without mention of misadventure at the time of the procedure: Secondary | ICD-10-CM | POA: Diagnosis present

## 2011-09-10 DIAGNOSIS — Z9849 Cataract extraction status, unspecified eye: Secondary | ICD-10-CM

## 2011-09-10 DIAGNOSIS — E860 Dehydration: Secondary | ICD-10-CM | POA: Diagnosis present

## 2011-09-10 DIAGNOSIS — F0390 Unspecified dementia without behavioral disturbance: Secondary | ICD-10-CM | POA: Diagnosis present

## 2011-09-10 DIAGNOSIS — Z87891 Personal history of nicotine dependence: Secondary | ICD-10-CM

## 2011-09-10 DIAGNOSIS — K922 Gastrointestinal hemorrhage, unspecified: Secondary | ICD-10-CM | POA: Diagnosis present

## 2011-09-10 DIAGNOSIS — L89609 Pressure ulcer of unspecified heel, unspecified stage: Secondary | ICD-10-CM | POA: Diagnosis present

## 2011-09-10 DIAGNOSIS — R197 Diarrhea, unspecified: Principal | ICD-10-CM | POA: Diagnosis present

## 2011-09-10 DIAGNOSIS — F329 Major depressive disorder, single episode, unspecified: Secondary | ICD-10-CM

## 2011-09-10 DIAGNOSIS — T373X5A Adverse effect of other antiprotozoal drugs, initial encounter: Secondary | ICD-10-CM | POA: Diagnosis present

## 2011-09-10 DIAGNOSIS — R5381 Other malaise: Secondary | ICD-10-CM | POA: Diagnosis present

## 2011-09-10 DIAGNOSIS — T8131XA Disruption of external operation (surgical) wound, not elsewhere classified, initial encounter: Secondary | ICD-10-CM | POA: Diagnosis present

## 2011-09-10 DIAGNOSIS — R000 Tachycardia, unspecified: Secondary | ICD-10-CM | POA: Diagnosis present

## 2011-09-10 DIAGNOSIS — E785 Hyperlipidemia, unspecified: Secondary | ICD-10-CM

## 2011-09-10 DIAGNOSIS — I2510 Atherosclerotic heart disease of native coronary artery without angina pectoris: Secondary | ICD-10-CM | POA: Diagnosis present

## 2011-09-10 DIAGNOSIS — I739 Peripheral vascular disease, unspecified: Secondary | ICD-10-CM | POA: Diagnosis present

## 2011-09-10 DIAGNOSIS — D649 Anemia, unspecified: Secondary | ICD-10-CM | POA: Diagnosis present

## 2011-09-10 DIAGNOSIS — E119 Type 2 diabetes mellitus without complications: Secondary | ICD-10-CM | POA: Diagnosis present

## 2011-09-10 DIAGNOSIS — L8992 Pressure ulcer of unspecified site, stage 2: Secondary | ICD-10-CM | POA: Diagnosis present

## 2011-09-10 DIAGNOSIS — Z79899 Other long term (current) drug therapy: Secondary | ICD-10-CM

## 2011-09-10 DIAGNOSIS — Z794 Long term (current) use of insulin: Secondary | ICD-10-CM

## 2011-09-10 DIAGNOSIS — R531 Weakness: Secondary | ICD-10-CM

## 2011-09-10 DIAGNOSIS — I10 Essential (primary) hypertension: Secondary | ICD-10-CM

## 2011-09-10 DIAGNOSIS — R112 Nausea with vomiting, unspecified: Secondary | ICD-10-CM | POA: Diagnosis present

## 2011-09-10 LAB — CBC
HCT: 33.6 % — ABNORMAL LOW (ref 36.0–46.0)
MCHC: 29.5 g/dL — ABNORMAL LOW (ref 30.0–36.0)
Platelets: 334 10*3/uL (ref 150–400)
RDW: 19.2 % — ABNORMAL HIGH (ref 11.5–15.5)
WBC: 11.8 10*3/uL — ABNORMAL HIGH (ref 4.0–10.5)

## 2011-09-10 LAB — LACTIC ACID, PLASMA: Lactic Acid, Venous: 1.1 mmol/L (ref 0.5–2.2)

## 2011-09-10 LAB — URINALYSIS, ROUTINE W REFLEX MICROSCOPIC
Glucose, UA: NEGATIVE mg/dL
Nitrite: NEGATIVE
Specific Gravity, Urine: 1.025 (ref 1.005–1.030)
pH: 5 (ref 5.0–8.0)

## 2011-09-10 LAB — CLOSTRIDIUM DIFFICILE BY PCR: Toxigenic C. Difficile by PCR: NEGATIVE

## 2011-09-10 LAB — COMPREHENSIVE METABOLIC PANEL
AST: 22 U/L (ref 0–37)
Albumin: 2.4 g/dL — ABNORMAL LOW (ref 3.5–5.2)
BUN: 15 mg/dL (ref 6–23)
Creatinine, Ser: 0.61 mg/dL (ref 0.50–1.10)
Potassium: 3.9 mEq/L (ref 3.5–5.1)
Total Protein: 6.1 g/dL (ref 6.0–8.3)

## 2011-09-10 LAB — DIFFERENTIAL
Basophils Absolute: 0 10*3/uL (ref 0.0–0.1)
Eosinophils Absolute: 0 10*3/uL (ref 0.0–0.7)
Lymphocytes Relative: 5 % — ABNORMAL LOW (ref 12–46)
Lymphs Abs: 0.6 10*3/uL — ABNORMAL LOW (ref 0.7–4.0)
Monocytes Relative: 2 % — ABNORMAL LOW (ref 3–12)
Neutro Abs: 11 10*3/uL — ABNORMAL HIGH (ref 1.7–7.7)

## 2011-09-10 LAB — LIPASE, BLOOD: Lipase: 37 U/L (ref 11–59)

## 2011-09-10 LAB — URINE MICROSCOPIC-ADD ON

## 2011-09-10 MED ORDER — METRONIDAZOLE 500 MG PO TABS
500.0000 mg | ORAL_TABLET | Freq: Once | ORAL | Status: AC
Start: 2011-09-10 — End: 2011-09-10
  Administered 2011-09-10: 500 mg via ORAL
  Filled 2011-09-10: qty 1

## 2011-09-10 MED ORDER — ONDANSETRON HCL 4 MG/2ML IJ SOLN
4.0000 mg | Freq: Once | INTRAMUSCULAR | Status: AC
  Administered 2011-09-10: 4 mg via INTRAVENOUS
  Filled 2011-09-10: qty 2

## 2011-09-10 MED ORDER — IOHEXOL 300 MG/ML  SOLN
100.0000 mL | Freq: Once | INTRAMUSCULAR | Status: AC | PRN
  Administered 2011-09-10: 100 mL via INTRAVENOUS

## 2011-09-10 MED ORDER — SODIUM CHLORIDE 0.9 % IV SOLN
Freq: Once | INTRAVENOUS | Status: AC
  Administered 2011-09-10: 12:00:00 via INTRAVENOUS

## 2011-09-10 MED ORDER — SODIUM CHLORIDE 0.9 % IV BOLUS (SEPSIS)
1000.0000 mL | Freq: Once | INTRAVENOUS | Status: AC
  Administered 2011-09-10: 1000 mL via INTRAVENOUS

## 2011-09-10 NOTE — ED Notes (Signed)
From [facility]TC w/ onset of "none stop emesis and diarrhea" this am. Emesis described as green, diarrhea described as watery.

## 2011-09-10 NOTE — ED Provider Notes (Addendum)
History     CSN: [id number]  Arrival date & time 08/15/11  1341   First MD Initiated Contact with Patient 08/15/11 1355      Chief Complaint  Patient presents with  . Leg Pain    Right    (Consider location/radiation/quality/duration/timing/severity/associated sxs/prior treatment) HPI Per ems- pt c/o right leg pain with dec sensation and limited movement. Neg for pedal pulses on right side  Past Medical History  Diagnosis Date  . Delirium 12/10/08    hospitalized  . Pelvic fracture 07/01/09    hospitalized  . Coronary artery disease   . Femoral neck fracture   . Anemia   . Hypertension   . Diabetes mellitus     Past Surgical History  Procedure Date  . Cholecystectomy   . Cataract extraction   . Axillary-femoral bypass graft 08/15/2011    Procedure: BYPASS GRAFT AXILLA-BIFEMORAL;  Surgeon: [name], MD;  Location: [facility] OR;  Service: Vascular;  Laterality: Right;  Embolectomy of Right Femoral Artery with Right  Axilla Femoral Bypass Graft.    Family History  Problem Relation Age of Onset  . Heart failure Father     died age 64  . Stroke Mother     died age 45    History  Substance Use Topics  . Smoking status: Former Games developer  . Smokeless tobacco: Never Used  . Alcohol Use: No    OB History    Grav Para Term Preterm Abortions TAB SAB Ect Mult Living                  Review of Systems  Unable to perform ROS: Dementia    Allergies  Review of patient's allergies indicates no known allergies.  Home Medications   Current Outpatient Rx  Name Route Sig Dispense Refill  . ALUM & MAG HYDROXIDE-SIMETH 400-400-40 MG/5ML PO SUSP Oral Take 30 mLs by mouth every 6 (six) hours as needed. dyspepsia    . BISACODYL 10 MG RE SUPP Rectal Place 10 mg rectally as needed. For constipation    . DONEPEZIL HCL 5 MG PO TABS Oral Take 5 mg by mouth at bedtime.    Marland Kitchen HYDROCHLOROTHIAZIDE 25 MG PO TABS Oral Take 1 tablet (25 mg total) by mouth daily. 30 tablet 6  . INSULIN  ASPART 100 UNIT/ML [place] SOLN Subcutaneous Inject 0-8 Units into the skin 3 (three) times daily before meals.     Marland Kitchen MAGNESIUM HYDROXIDE 400 MG/5ML PO SUSP Oral Take 30 mLs by mouth at bedtime as needed. For constipation.    Marland Kitchen METOPROLOL TARTRATE 50 MG PO TABS Oral Take 1 tablet (50 mg total) by mouth 2 (two) times daily. 60 tablet 5  . NITROGLYCERIN 0.3 MG/HR TD PT24 Transdermal Place 1 patch onto the skin daily.    Marland Kitchen OVER THE COUNTER MEDICATION Oral Take 1 capsule by mouth 2 (two) times daily. Vitamin B complex with iron intrinsic factor    . SERTRALINE HCL 50 MG PO TABS Oral Take 50 mg by mouth at bedtime.    Marland Kitchen SIMVASTATIN 40 MG PO TABS Oral Take 1 tablet (40 mg total) by mouth at bedtime. 30 tablet 5  . ENOXAPARIN SODIUM 30 MG/0.3ML [place] SOLN Subcutaneous Inject 30 mg into the skin every 12 (twelve) hours.     Marland Kitchen METFORMIN HCL 1000 MG PO TABS Oral Take 1,000 mg by mouth daily.    Marland Kitchen METRONIDAZOLE 500 MG PO TABS Oral Take 1 tablet (500 mg total) by mouth 3 (  three) times daily.    . OXYCODONE HCL 5 MG PO TABS Oral Take 1 tablet (5 mg total) by mouth every 4 (four) hours as needed. For severe pain 30 tablet 0    BP 150/67  Pulse 95  Temp(Src) 97.9 F (36.6 C) (Oral)  Resp 18  Ht 5\' 7"  (1.702 m)  Wt 170 lb 10.2 oz (77.4 kg)  BMI 26.73 kg/m2  SpO2 97%  Physical Exam  Nursing note and vitals reviewed. Constitutional: She appears well-developed and well-nourished. No distress.  HENT:  Head: Normocephalic and atraumatic.  Eyes: Conjunctivae are normal. Pupils are equal, round, and reactive to light.  Neck: Neck supple.  Cardiovascular: Regular rhythm and normal heart sounds.  Tachycardia present.  Exam reveals no gallop and no friction rub.   No murmur heard. Pulses:      Femoral pulses are 1+ on the right side.      Popliteal pulses are 0 on the right side.       Dorsalis pedis pulses are 0 on the right side, and 1+ on the left side.       Posterior tibial pulses are 0 on the right side, and  1+ on the left side.       No DP/PT/pop pulses palp on R leg. Skin from mid shin down to toes is purple and mottled. Decreased sensation over this area. Unable to wiggle toes or move ankle.  Pulmonary/Chest: Effort normal and breath sounds normal. She has no wheezes.  Abdominal: Soft. She exhibits no distension. There is no tenderness. There is no rebound.  Musculoskeletal: She exhibits no edema.  Neurological: She is alert. No cranial nerve deficit.  Skin: Skin is warm and dry. She is not diaphoretic.  Psychiatric: She has a normal mood and affect.        ED Course  Procedures (including critical care time)  Vascular surgery consulted .  Came to ED and evaluated patient.   1. Right leg pain   2. Arterial occlusion   3. Other and unspecified hyperlipidemia   4. Essential hypertension, benign   5. PVD (peripheral vascular disease)       MDM   CRITICAL CARE Performed by: Nelva Nay L   Total critical care time: 30 min   Critical care time was exclusive of separately billable procedures and treating other patients.  Critical care was necessary to treat or prevent imminent or life-threatening deterioration.  Critical care was time spent personally by me on the following activities: development of treatment plan with patient and/or surrogate as well as nursing, discussions with consultants, evaluation of patient's response to treatment, examination of patient, obtaining history from patient or surrogate, ordering and performing treatments and interventions, ordering and review of laboratory studies, ordering and review of radiographic studies, pulse oximetry and re-evaluation of patient's condition.        Nelia Shi, MD 09/10/11 1507  Nelia Shi, MD 09/10/11 [phone]

## 2011-09-10 NOTE — ED Notes (Signed)
Attempted to call report. RN to call back. 

## 2011-09-10 NOTE — ED Provider Notes (Signed)
History     CSN: [id number]  Arrival date & time 09/10/11  4098   First MD Initiated Contact with Patient 09/10/11 [id number]      Chief Complaint  Patient presents with  . Emesis    (Consider location/radiation/quality/duration/timing/severity/associated sxs/prior treatment) HPI Comments: Patient presents from her nursing facility with one day of nausea, vomiting diarrhea. Reports he is vomited 5 times since last night. She's also had loose watery stools have been "constant" she prolonged hospitalization from February 26 2 March 5 for postoperative wound infection of her aortofemoral bypass graft. She is on multiple antibiotics including Zosyn and Avelox which have been stopped.  She was tested in the hospital twice for Clostridium difficile and tested negative. However she did have persistent diarrhea and was discharged on by mouth Flagyl. She complains of lower abdominal pain that started today. She denies any fever, chest pain, shortness of breath.  The history is provided by the patient and the nursing home.    Past Medical History  Diagnosis Date  . Delirium 12/10/08    hospitalized  . Pelvic fracture 07/01/09    hospitalized  . Coronary artery disease   . Femoral neck fracture   . Anemia   . Hypertension   . Diabetes mellitus     Past Surgical History  Procedure Date  . Cholecystectomy   . Cataract extraction   . Axillary-femoral bypass graft 08/15/2011    Procedure: BYPASS GRAFT AXILLA-BIFEMORAL;  Surgeon: [name], MD;  Location: [facility] OR;  Service: Vascular;  Laterality: Right;  Embolectomy of Right Femoral Artery with Right  Axilla Femoral Bypass Graft.    Family History  Problem Relation Age of Onset  . Heart failure Father     died age 64  . Stroke Mother     died age 41    History  Substance Use Topics  . Smoking status: Former Games developer  . Smokeless tobacco: Never Used  . Alcohol Use: No    OB History    Grav Para Term Preterm Abortions TAB SAB Ect  Mult Living                  Review of Systems  Constitutional: Positive for activity change and appetite change. Negative for fever.  HENT: Negative for congestion and rhinorrhea.   Respiratory: Negative for cough, chest tightness and stridor.   Cardiovascular: Negative for chest pain.  Gastrointestinal: Positive for nausea, vomiting, abdominal pain and diarrhea.  Genitourinary: Negative for dysuria, hematuria, vaginal bleeding and vaginal discharge.  Musculoskeletal: Negative for back pain.  Skin: Negative for rash.  Neurological: Negative for headaches.    Allergies  Review of patient's allergies indicates no known allergies.  Home Medications   Current Outpatient Rx  Name Route Sig Dispense Refill  . ALUM & MAG HYDROXIDE-SIMETH 400-400-40 MG/5ML PO SUSP Oral Take 30 mLs by mouth every 6 (six) hours as needed. dyspepsia    . BISACODYL 10 MG RE SUPP Rectal Place 10 mg rectally as needed. For constipation    . DONEPEZIL HCL 5 MG PO TABS Oral Take 5 mg by mouth at bedtime.    Marland Kitchen ENOXAPARIN SODIUM 30 MG/0.3ML [place] SOLN Subcutaneous Inject 30 mg into the skin every 12 (twelve) hours.     Marland Kitchen HYDROCHLOROTHIAZIDE 25 MG PO TABS Oral Take 1 tablet (25 mg total) by mouth daily. 30 tablet 6  . INSULIN ASPART 100 UNIT/ML [place] SOLN Subcutaneous Inject 0-8 Units into the skin 3 (three) times daily before meals.     Marland Kitchen  MAGNESIUM HYDROXIDE 400 MG/5ML PO SUSP Oral Take 30 mLs by mouth at bedtime as needed. For constipation.    Marland Kitchen METFORMIN HCL 1000 MG PO TABS Oral Take 1,000 mg by mouth daily.    Marland Kitchen METOPROLOL TARTRATE 50 MG PO TABS Oral Take 1 tablet (50 mg total) by mouth 2 (two) times daily. 60 tablet 5  . METRONIDAZOLE 500 MG PO TABS Oral Take 1 tablet (500 mg total) by mouth 3 (three) times daily.    Marland Kitchen NITROGLYCERIN 0.3 MG/HR TD PT24 Transdermal Place 1 patch onto the skin daily.    Marland Kitchen OVER THE COUNTER MEDICATION Oral Take 1 capsule by mouth 2 (two) times daily. Vitamin B complex with iron intrinsic  factor    . OXYCODONE HCL 5 MG PO TABS Oral Take 1 tablet (5 mg total) by mouth every 4 (four) hours as needed. For severe pain 30 tablet 0  . SERTRALINE HCL 50 MG PO TABS Oral Take 50 mg by mouth at bedtime.    Marland Kitchen SIMVASTATIN 40 MG PO TABS Oral Take 1 tablet (40 mg total) by mouth at bedtime. 30 tablet 5    BP 126/39  Pulse 110  Temp(Src) 99.3 F (37.4 C) (Oral)  Resp 18  SpO2 94%  Physical Exam  Constitutional: She is oriented to person, place, and time. She appears well-developed and well-nourished. No distress.  HENT:  Head: Normocephalic and atraumatic.  Mouth/Throat: Oropharynx is clear and moist. No oropharyngeal exudate.  Eyes: Conjunctivae are normal. Pupils are equal, round, and reactive to light.  Neck: Normal range of motion. Neck supple.  Cardiovascular: Normal rate, regular rhythm and normal heart sounds.   Pulmonary/Chest: Effort normal and breath sounds normal. No respiratory distress.  Abdominal: Soft. There is tenderness. There is no rebound and no guarding.       Tenderness to palpation diffusely in the lower abdomen with voluntary guarding  Musculoskeletal: Normal range of motion.       Weak right ankle flexion and extension. Unable to palpate DP or PT pulses. Unable to wiggle toes which is baseline for patient  Neurological: She is alert and oriented to person, place, and time. No cranial nerve deficit.  Skin: Skin is warm.    ED Course  Procedures (including critical care time)  Labs Reviewed  CBC - Abnormal; Notable for the following:    WBC 11.8 (*)    Hemoglobin 9.9 (*)    HCT 33.6 (*)    MCV 74.5 (*)    MCH 22.0 (*)    MCHC 29.5 (*)    RDW 19.2 (*)    All other components within normal limits  DIFFERENTIAL - Abnormal; Notable for the following:    Neutrophils Relative 93 (*)    Lymphocytes Relative 5 (*)    Monocytes Relative 2 (*)    Neutro Abs 11.0 (*)    Lymphs Abs 0.6 (*)    All other components within normal limits  COMPREHENSIVE  METABOLIC PANEL - Abnormal; Notable for the following:    Glucose, Bld 192 (*)    Albumin 2.4 (*)    Alkaline Phosphatase 123 (*)    Total Bilirubin 0.2 (*)    GFR calc non Af Amer 83 (*)    All other components within normal limits  URINALYSIS, ROUTINE W REFLEX MICROSCOPIC - Abnormal; Notable for the following:    APPearance HAZY (*)    Ketones, ur 15 (*)    Leukocytes, UA SMALL (*)    All other components  within normal limits  LIPASE, BLOOD  LACTIC ACID, PLASMA  CLOSTRIDIUM DIFFICILE BY PCR  URINE MICROSCOPIC-ADD ON  STOOL CULTURE   Ct Abdomen Pelvis W Contrast  09/10/2011  *RADIOLOGY REPORT*  Clinical Data: Emesis.  CT ABDOMEN AND PELVIS WITH CONTRAST  Technique:  Multidetector CT imaging of the abdomen and pelvis was performed following the standard protocol during bolus administration of intravenous contrast.  Contrast: OMNIPAQUE IOHEXOL 300 MG/ML IJ SOLN  Comparison: Multiple priors, most recently a CT of pelvis 08/14/2011, and CT of the abdomen and pelvis dated 07/01/2009.None.  Findings:  Lung Bases: There is a large hiatal hernia. Atherosclerosis in the descending thoracic aorta.  Abdomen/Pelvis:  The patient is status post cholecystectomy.  There is moderate intrahepatic biliary ductal dilatation (similar to remote prior study from 07/01/2009.  No extrahepatic biliary ductal dilatation (common bile duct measures 7 mm in the porta hepatis). There are a few calcifications scattered throughout the liver and spleen, consistent with calcified granulomas.  No other focal hepatic or splenic lesions are noted.  The enhanced appearance of the pancreas, bilateral adrenal glands and right kidney is unremarkable.  There is a punctate 1-2 mm calcification in the interpolar region of the left kidney (image 30 of series 2), favored to be of vascular origin (less likely represent a small nonobstructive calculus).  Numerous colonic diverticula are noted, most pronounced in the region of the sigmoid  colon, without surrounding inflammatory changes to suggest acute diverticulitis at this time.  Uterus and ovaries are atrophic.  In the left ovary there is a 1.4 cm intermediate attenuation (28 HU) lesion which is unchanged in retrospect compared to the prior study 07/01/2009, presumably benign.  Portions of the pelvis are largely obscured by extensive beam hardening artifact from the patient's left-sided total hip prosthesis, however, the visualized portions of the urinary bladder are unremarkable.  Laxity of the anterior abdominal wall musculature, without focal hernia.  The patient is status post aortobifemoral bypass graft.  There is complete occlusion of the native infrarenal abdominal aorta and bilateral common iliac arteries. There is there is also complete occlusion of the aortabifemoral bypass graft. There is reconstitution of the arterial flow in the left external and internal iliac artery branches (this is less confident on the right side, however, a recent CT 02/02/2011 2013 and did demonstrate reconstitution of flow within the native vessels on the right). The patient is status post axillofemoral bypass to right common femoral artery (visualized portions of the bypass graft are patent).  The celiac axis, SMA, bilateral renal arteries and inferior mesenteric artery are all patent.  Notably, the origin of the inferior mesenteric artery is occluded, however, distal branches are reconstituted, presumably from collateral flow.  Musculoskeletal: Status post left total hip arthroplasty.  Healed fracture of the right inferior pubic ramus.  Mild osteopenia. There are no aggressive appearing lytic or blastic lesions noted in the visualized portions of the skeleton.  Multiple healed lower right rib fractures laterally incidentally noted.  There are compression fractures at T11 and L2 with approximately 50% loss of height at both levels (increased compared to prior examination 07/01/2009). However, axial images do  not demonstrate any surrounding soft tissue swelling to suggest acute compression fractures on today's examination (and findings are similar to prior study 08/15/2011).  IMPRESSION: 1. 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  There has been interval placement of a  right-sided axillary femoral bypass graft which appears patent and supplies the right common femoral artery.  Reconstitution of flow in the left internal and external iliac arteries is noted.  Although reconstitution of flow in the right external and internal iliac arteries was noted on prior CT angiogram dated 08/15/2011, flow cannot be confirmed within these vessels on today's examination (this may be an artifact of bolus timing). 2.  No definite acute findings in the abdomen or pelvis to account for the patient's symptoms.  Specifically, no signs of bowel obstruction. 3.  Colonic diverticulosis without findings to suggest acute diverticulitis. 4.  Status post cholecystectomy and left total hip arthroplasty. 5.  Compression fractures at T11 and L2 with approximately 50% loss of height at both levels.  Original Report Authenticated By: Florencia Reasons, M.D.     1. Diarrhea   2. Weakness       MDM  Nausea, vomiting, diarrhea abdominal pain. Recent hospitalization. Empiric treatment of for Clostridium difficile.  Stable postoperative findings on CT without evidence for colitis. Stable anemia.  Patient remains tachycardic and orthostatic and unable to stand. She says she was walking up until today. She's had no further episodes of vomiting or diarrhea. Will admit for observation and continued IV fluids and empiric C. difficile treatment. D/w family practice resident.    Glynn Octave, MD 09/10/11 [phone]

## 2011-09-10 NOTE — ED Notes (Signed)
Pt transferred [facility]

## 2011-09-11 ENCOUNTER — Ambulatory Visit: Payer: Medicare Other | Admitting: Surgery

## 2011-09-11 DIAGNOSIS — D62 Acute posthemorrhagic anemia: Secondary | ICD-10-CM

## 2011-09-11 DIAGNOSIS — I739 Peripheral vascular disease, unspecified: Secondary | ICD-10-CM

## 2011-09-11 DIAGNOSIS — R197 Diarrhea, unspecified: Secondary | ICD-10-CM

## 2011-09-11 DIAGNOSIS — E119 Type 2 diabetes mellitus without complications: Secondary | ICD-10-CM

## 2011-09-11 LAB — CBC
Platelets: 286 10*3/uL (ref 150–400)
RBC: 3.76 MIL/uL — ABNORMAL LOW (ref 3.87–5.11)
RDW: 19.4 % — ABNORMAL HIGH (ref 11.5–15.5)
WBC: 4.3 10*3/uL (ref 4.0–10.5)

## 2011-09-11 LAB — BASIC METABOLIC PANEL
BUN: 13 mg/dL (ref 6–23)
CO2: 25 mEq/L (ref 19–32)
Calcium: 8.1 mg/dL — ABNORMAL LOW (ref 8.4–10.5)
Calcium: 8.2 mg/dL — ABNORMAL LOW (ref 8.4–10.5)
Creatinine, Ser: 0.54 mg/dL (ref 0.50–1.10)
GFR calc Af Amer: >90 mL/min (ref >90)
GFR calc Af Amer: >90 mL/min (ref >90)
GFR calc non Af Amer: 85 mL/min — ABNORMAL LOW (ref >90)
GFR calc non Af Amer: 86 mL/min — ABNORMAL LOW (ref >90)
Glucose, Bld: 109 mg/dL — ABNORMAL HIGH (ref 70–99)
Sodium: 142 mEq/L (ref 135–145)

## 2011-09-11 LAB — GLUCOSE, CAPILLARY
Glucose-Capillary: 88 mg/dL (ref 70–99)
Glucose-Capillary: 89 mg/dL (ref 70–99)

## 2011-09-11 MED ORDER — ENOXAPARIN SODIUM 40 MG/0.4ML ~~LOC~~ SOLN
40.0000 mg | SUBCUTANEOUS | Status: DC
  Administered 2011-09-11 – 2011-09-12 (×2): 40 mg via SUBCUTANEOUS
  Filled 2011-09-11 (×2): qty 0.4

## 2011-09-11 MED ORDER — INSULIN ASPART 100 UNIT/ML ~~LOC~~ SOLN: 0.0000 [IU] | Freq: Every day | SUBCUTANEOUS | Status: DC

## 2011-09-11 MED ORDER — ALUM & MAG HYDROXIDE-SIMETH 400-400-40 MG/5ML PO SUSP: 30.0000 mL | Freq: Four times a day (QID) | ORAL | Status: DC | PRN

## 2011-09-11 MED ORDER — POTASSIUM CHLORIDE CRYS ER 20 MEQ PO TBCR
40.0000 meq | EXTENDED_RELEASE_TABLET | Freq: Two times a day (BID) | ORAL | Status: AC
  Administered 2011-09-11 (×2): 40 meq via ORAL
  Filled 2011-09-11 (×2): qty 2

## 2011-09-11 MED ORDER — ONDANSETRON HCL 4 MG/2ML IJ SOLN: 4.0000 mg | Freq: Four times a day (QID) | INTRAMUSCULAR | Status: DC | PRN

## 2011-09-11 MED ORDER — OXYCODONE HCL 5 MG PO TABS: 5.0000 mg | ORAL_TABLET | ORAL | Status: DC | PRN

## 2011-09-11 MED ORDER — HYDROCHLOROTHIAZIDE 25 MG PO TABS
25.0000 mg | ORAL_TABLET | Freq: Every day | ORAL | Status: DC
  Administered 2011-09-11 – 2011-09-13 (×3): 25 mg via ORAL
  Filled 2011-09-11 (×3): qty 1

## 2011-09-11 MED ORDER — POTASSIUM CHLORIDE CRYS ER 20 MEQ PO TBCR
40.0000 meq | EXTENDED_RELEASE_TABLET | Freq: Once | ORAL | Status: AC
  Administered 2011-09-11: 40 meq via ORAL
  Filled 2011-09-11: qty 2

## 2011-09-11 MED ORDER — INSULIN ASPART 100 UNIT/ML ~~LOC~~ SOLN
0.0000 [IU] | Freq: Three times a day (TID) | SUBCUTANEOUS | Status: DC
  Filled 2011-09-11: qty 3

## 2011-09-11 MED ORDER — SIMVASTATIN 40 MG PO TABS
40.0000 mg | ORAL_TABLET | Freq: Every day | ORAL | Status: DC
  Administered 2011-09-11 – 2011-09-12 (×2): 40 mg via ORAL
  Filled 2011-09-11 (×3): qty 1

## 2011-09-11 MED ORDER — DONEPEZIL HCL 5 MG PO TABS
5.0000 mg | ORAL_TABLET | Freq: Every day | ORAL | Status: DC
  Administered 2011-09-11 – 2011-09-12 (×2): 5 mg via ORAL
  Filled 2011-09-11 (×3): qty 1

## 2011-09-11 MED ORDER — METOPROLOL TARTRATE 50 MG PO TABS
50.0000 mg | ORAL_TABLET | Freq: Two times a day (BID) | ORAL | Status: DC
  Administered 2011-09-11 – 2011-09-13 (×5): 50 mg via ORAL
  Filled 2011-09-11 (×6): qty 1

## 2011-09-11 MED ORDER — SODIUM CHLORIDE 0.9 % IV SOLN
INTRAVENOUS | Status: DC
  Administered 2011-09-11 – 2011-09-12 (×4): via INTRAVENOUS

## 2011-09-11 MED ORDER — ONDANSETRON HCL 4 MG PO TABS: 4.0000 mg | ORAL_TABLET | Freq: Four times a day (QID) | ORAL | Status: DC | PRN

## 2011-09-11 MED ORDER — SERTRALINE HCL 50 MG PO TABS
50.0000 mg | ORAL_TABLET | Freq: Every day | ORAL | Status: DC
  Administered 2011-09-11: 50 mg via ORAL
  Filled 2011-09-11 (×2): qty 1

## 2011-09-11 NOTE — Consult Note (Signed)
WOC consult Note Reason for Consult: Consult requested for several wounds.  Pt had vascular surgery in Feb and still has staples to right groin.   This site has dehisced in middle of wound to full thickness 2X2X2cm.  Beefy red with large amt green-tinged drainage, no odor.  Staples remain intact and well approximated to lower groin.   Partial thickness wound above full thickness 1X1X.1cm 90% red, 10% yellow slough, small tan drainage.  Left foot with previous blister which has ruptured and evolved into stage 2 wound 50% yellow, 50% red, sm yellow drainage, no odor Left groin with 2 partial thickness wounds, each .2X.2X.1cm, dark red, small yellow drainage, no odor.  Left foot pressure ulcer present on admission. Dressing procedure/placement/frequency: Foam dressing to left foot and left groin areas to protect and absorb  drainage.  Prevalon boot to reduce pressure to left heel. Moist gauze to right groin until further input from [facility].  According to chart they have been consulted for this site since it is a post-op incision.  Will not plan to follow further unless re-consulted.  [address] RN, MSN, [employer]  [phone]

## 2011-09-11 NOTE — Progress Notes (Signed)
Pt's potassium was 2.9 this am, Md was paged who just called back and stated she was going to put some orders in for the patient. Md was also made aware that pt has been so weak that she was not able to stand long enough for staff to get the ordered orthostatic vital signs and that staff will try again to get it when she is able to tolerate standing----Alleene Stoy, D, rn

## 2011-09-11 NOTE — Progress Notes (Signed)
Pt is so weak and unable to stand for orthostatic vital signs even with 2+ assistance. Will pass it off to the on-coming Rn and tech to follow up/try later.------Kyree Fedorko, D, rn

## 2011-09-11 NOTE — H&P (Signed)
[facility] Attending Admission Note: Denny Levy MD [phone] pager office [phone] I  have seen and examined this patient, reviewed their chart. I have discussed this patient with the resident. I agree with the resident's findings, assessment and care plan. No emesis since admission to Ed / hospital and she was enjoying eating breakfast of scrambled eggs when i saw her this am. She is moderately tender in her B lower quadrants, underneath panus area. No masses. No rebound.No skin redness or warmth. I reviewed her CT scan---some artifact due to hip replacement on left but intra-abdominal organs are seen fairly well. Will have surgery eval as she has had recent B fem -pop. Check for norovirus by pcr. She seems quite comfortbale and very personable and happy at the time of my rounds.

## 2011-09-11 NOTE — H&P (Signed)
Mackenzie [name] is an 78 y.o. female.   Chief Complaint: nausea, vomiting and diarrhea HPI:  78 yo female with h/o DM, HTN, PVD who was recently hospitalized on 08/29/11 and discharged on 03/05 for right groin infection from a recent right axillobifemoral bypass. She was reportedly brought to the [facility] ED by her nursing facility for nausea, vomiting and diarrhea. In the ED, she was found to have orthostatic hypotension, tachycardia and difficulty standing and was admitted for IV hydration.   She reports having nausea and vomiting ever since she was discharged from the hospital. She describes 2 episodes of non bloody, bilious emesis per day. She has had decreased oral intake. Last time she ate was day before last when she had ice cream. She did however drink some water earlier in the afternoon. The vomiting is not associated with food intake. She was started on Flagyl on discharge from the hospital and thinks her nausea and vomiting might have started with it.  She also has had some watery, non bloody stools that occur 2-3 times daily. This was present during her last hospitalization. C-diff at the time was negative twice. She was empirically started on flagyl and discharged on 7 day course. She doesn't find any improvement in her symptoms.  She denies any associated abdominal pain.   She denies any weakness or dizziness with walking. She uses a walker to walk and has some limited mobility in her right leg.   Past Medical History  Diagnosis Date  . Delirium 12/10/08    hospitalized  . Pelvic fracture 07/01/09    hospitalized  . Coronary artery disease   . Femoral neck fracture   . Anemia   . Hypertension   . Diabetes mellitus     Past Surgical History  Procedure Date  . Cholecystectomy   . Cataract extraction   . Axillary-femoral bypass graft 08/15/2011    Procedure: BYPASS GRAFT AXILLA-BIFEMORAL;  Surgeon: [name], MD;  Location: [facility] OR;  Service: Vascular;  Laterality: Right;   Embolectomy of Right Femoral Artery with Right  Axilla Femoral Bypass Graft.    Family History  Problem Relation Age of Onset  . Heart failure Father     died age 43  . Stroke Mother     died age 21   Social History:  reports that she has quit smoking more than 20 years ago. She has never used smokeless tobacco. She reports that she does not drink alcohol or use illicit drugs.  Allergies: No Known Allergies  Medications Prior to Admission  Medication Sig Dispense Refill  . alum & mag hydroxide-simeth (MYLANTA DOUBLE-STRENGTH) 400-400-40 MG/5ML suspension Take 30 mLs by mouth every 6 (six) hours as needed. dyspepsia      . bisacodyl (DULCOLAX) 10 MG suppository Place 10 mg rectally as needed. For constipation      . donepezil (ARICEPT) 5 MG tablet Take 5 mg by mouth at bedtime.      . enoxaparin (LOVENOX) 30 MG/0.3ML SOLN Inject 30 mg into the skin every 12 (twelve) hours.       . hydrochlorothiazide (HYDRODIURIL) 25 MG tablet Take 1 tablet (25 mg total) by mouth daily.  30 tablet  6  . insulin aspart (NOVOLOG) 100 UNIT/ML injection Inject 0-8 Units into the skin 3 (three) times daily before meals.       . magnesium hydroxide (MILK OF MAGNESIA) 400 MG/5ML suspension Take 30 mLs by mouth at bedtime as needed. For constipation.      Marland Kitchen  metoprolol (LOPRESSOR) 50 MG tablet Take 1 tablet (50 mg total) by mouth 2 (two) times daily.  60 tablet  5  . metroNIDAZOLE (FLAGYL) 500 MG tablet Take 1 tablet (500 mg total) by mouth 3 (three) times daily.      . nitroGLYCERIN (NITRODUR - DOSED IN MG/24 HR) 0.3 mg/hr Place 1 patch onto the skin daily.      Marland Kitchen OVER THE COUNTER MEDICATION Take 1 capsule by mouth 2 (two) times daily. Vitamin B complex with iron intrinsic factor      . oxyCODONE (OXY IR/ROXICODONE) 5 MG immediate release tablet Take 1 tablet (5 mg total) by mouth every 4 (four) hours as needed. For severe pain  30 tablet  0  . sertraline (ZOLOFT) 50 MG tablet Take 50 mg by mouth at bedtime.        . simvastatin (ZOCOR) 40 MG tablet Take 1 tablet (40 mg total) by mouth at bedtime.  30 tablet  5    Results for orders placed during the hospital encounter of 09/10/11 (from the past 48 hour(s))  CBC     Status: Abnormal   Collection Time   09/10/11  9:30 AM      Component Value Range Comment   WBC 11.8 (*) 4.0 - 10.5 (K/uL)    RBC 4.51  3.87 - 5.11 (MIL/uL)    Hemoglobin 9.9 (*) 12.0 - 15.0 (g/dL)    HCT 09.8 (*) 11.9 - 46.0 (%)    MCV 74.5 (*) 78.0 - 100.0 (fL)    MCH 22.0 (*) 26.0 - 34.0 (pg)    MCHC 29.5 (*) 30.0 - 36.0 (g/dL)    RDW 14.7 (*) 82.9 - 15.5 (%)    Platelets 334  150 - 400 (K/uL)   DIFFERENTIAL     Status: Abnormal   Collection Time   09/10/11  9:30 AM      Component Value Range Comment   Neutrophils Relative 93 (*) 43 - 77 (%)    Lymphocytes Relative 5 (*) 12 - 46 (%)    Monocytes Relative 2 (*) 3 - 12 (%)    Eosinophils Relative 0  0 - 5 (%)    Basophils Relative 0  0 - 1 (%)    Neutro Abs 11.0 (*) 1.7 - 7.7 (K/uL)    Lymphs Abs 0.6 (*) 0.7 - 4.0 (K/uL)    Monocytes Absolute 0.2  0.1 - 1.0 (K/uL)    Eosinophils Absolute 0.0  0.0 - 0.7 (K/uL)    Basophils Absolute 0.0  0.0 - 0.1 (K/uL)    RBC Morphology POLYCHROMASIA PRESENT     COMPREHENSIVE METABOLIC PANEL     Status: Abnormal   Collection Time   09/10/11  9:30 AM      Component Value Range Comment   Sodium 135  135 - 145 (mEq/L)    Potassium 3.9  3.5 - 5.1 (mEq/L)    Chloride 99  96 - 112 (mEq/L)    CO2 27  19 - 32 (mEq/L)    Glucose, Bld 192 (*) 70 - 99 (mg/dL)    BUN 15  6 - 23 (mg/dL)    Creatinine, Ser 5.62  0.50 - 1.10 (mg/dL)    Calcium 9.4  8.4 - 10.5 (mg/dL)    Total Protein 6.1  6.0 - 8.3 (g/dL)    Albumin 2.4 (*) 3.5 - 5.2 (g/dL)    AST 22  0 - 37 (U/L) HEMOLYSIS AT THIS LEVEL MAY AFFECT RESULT   ALT 9  0 - 35 (U/L)    Alkaline Phosphatase 123 (*) 39 - 117 (U/L)    Total Bilirubin 0.2 (*) 0.3 - 1.2 (mg/dL)    GFR calc non Af Amer 83 (*) >90 (mL/min)    GFR calc Af Amer >90  >90  (mL/min)   LIPASE, BLOOD     Status: Normal   Collection Time   09/10/11  9:30 AM      Component Value Range Comment   Lipase 37  11 - 59 (U/L)   LACTIC ACID, PLASMA     Status: Normal   Collection Time   09/10/11  9:30 AM      Component Value Range Comment   Lactic Acid, Venous 1.1  0.5 - 2.2 (mmol/L)   URINALYSIS, ROUTINE W REFLEX MICROSCOPIC     Status: Abnormal   Collection Time   09/10/11  9:46 AM      Component Value Range Comment   Color, Urine YELLOW  YELLOW     APPearance HAZY (*) CLEAR     Specific Gravity, Urine 1.025  1.005 - 1.030     pH 5.0  5.0 - 8.0     Glucose, UA NEGATIVE  NEGATIVE (mg/dL)    Hgb urine dipstick NEGATIVE  NEGATIVE     Bilirubin Urine NEGATIVE  NEGATIVE     Ketones, ur 15 (*) NEGATIVE (mg/dL)    Protein, ur NEGATIVE  NEGATIVE (mg/dL)    Urobilinogen, UA 0.2  0.0 - 1.0 (mg/dL)    Nitrite NEGATIVE  NEGATIVE     Leukocytes, UA SMALL (*) NEGATIVE    URINE MICROSCOPIC-ADD ON     Status: Normal   Collection Time   09/10/11  9:46 AM      Component Value Range Comment   WBC, UA 7-10  <3 (WBC/hpf)    Bacteria, UA RARE  RARE    CLOSTRIDIUM DIFFICILE BY PCR     Status: Normal   Collection Time   09/10/11 10:14 AM      Component Value Range Comment   C difficile by pcr NEGATIVE  NEGATIVE     Ct Abdomen Pelvis W Contrast  09/10/2011  *RADIOLOGY REPORT*  Clinical Data: Emesis.  CT ABDOMEN AND PELVIS WITH CONTRAST  Technique:  Multidetector CT imaging of the abdomen and pelvis was performed following the standard protocol during bolus administration of intravenous contrast.  Contrast: OMNIPAQUE IOHEXOL 300 MG/ML IJ SOLN  Comparison: Multiple priors, most recently a CT of pelvis 08/14/2011, and CT of the abdomen and pelvis dated 07/01/2009.None.  Findings:  Lung Bases: There is a large hiatal hernia. Atherosclerosis in the descending thoracic aorta.  Abdomen/Pelvis:  The patient is status post cholecystectomy.  There is moderate intrahepatic biliary ductal  dilatation (similar to remote prior study from 07/01/2009.  No extrahepatic biliary ductal dilatation (common bile duct measures 7 mm in the porta hepatis). There are a few calcifications scattered throughout the liver and spleen, consistent with calcified granulomas.  No other focal hepatic or splenic lesions are noted.  The enhanced appearance of the pancreas, bilateral adrenal glands and right kidney is unremarkable.  There is a punctate 1-2 mm calcification in the interpolar region of the left kidney (image 30 of series 2), favored to be of vascular origin (less likely represent a small nonobstructive calculus).  Numerous colonic diverticula are noted, most pronounced in the region of the sigmoid colon, without surrounding inflammatory changes to suggest acute diverticulitis at this time.  Uterus and ovaries are atrophic.  In the left ovary there is a 1.4 cm intermediate attenuation (28 HU) lesion which is unchanged in retrospect compared to the prior study 07/01/2009, presumably benign.  Portions of the pelvis are largely obscured by extensive beam hardening artifact from the patient's left-sided total hip prosthesis, however, the visualized portions of the urinary bladder are unremarkable.  Laxity of the anterior abdominal wall musculature, without focal hernia.  The patient is status post aortobifemoral bypass graft.  There is complete occlusion of the native infrarenal abdominal aorta and bilateral common iliac arteries. There is there is also complete occlusion of the aortabifemoral bypass graft. There is reconstitution of the arterial flow in the left external and internal iliac artery branches (this is less confident on the right side, however, a recent CT 02/02/2011 2013 and did demonstrate reconstitution of flow within the native vessels on the right). The patient is status post axillofemoral bypass to right common femoral artery (visualized portions of the bypass graft are patent).  The celiac axis,  SMA, bilateral renal arteries and inferior mesenteric artery are all patent.  Notably, the origin of the inferior mesenteric artery is occluded, however, distal branches are reconstituted, presumably from collateral flow.  Musculoskeletal: Status post left total hip arthroplasty.  Healed fracture of the right inferior pubic ramus.  Mild osteopenia. There are no aggressive appearing lytic or blastic lesions noted in the visualized portions of the skeleton.  Multiple healed lower right rib fractures laterally incidentally noted.  There are compression fractures at T11 and L2 with approximately 50% loss of height at both levels (increased compared to prior examination 07/01/2009). However, axial images do not demonstrate any surrounding soft tissue swelling to suggest acute compression fractures on today's examination (and findings are similar to prior study 08/15/2011).  IMPRESSION: 1. 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  There has been interval placement of a right-sided axillary femoral bypass graft which appears patent and supplies the right common femoral artery.  Reconstitution of flow in the left internal and external iliac arteries is noted.  Although reconstitution of flow in the right external and internal iliac arteries was noted on prior CT angiogram dated 08/15/2011, flow cannot be confirmed within these vessels on today's examination (this may be an artifact of bolus timing). 2.  No definite acute findings in the abdomen or pelvis to account for the patient's symptoms.  Specifically, no signs of bowel obstruction. 3.  Colonic diverticulosis without findings to suggest acute diverticulitis. 4.  Status post cholecystectomy and left total hip arthroplasty. 5.  Compression fractures at T11 and L2 with approximately 50% loss of height at both levels.  Original Report  Authenticated By: Florencia Reasons, M.D.    ROS Negative except per HPI Blood pressure 143/67, pulse 116, temperature 99.7 F (37.6 C), temperature source Oral, resp. rate 18, SpO2 94.00%. Physical Exam  Physical Examination: General appearance - alert, well appearing, elderly lady, in no distress Mental status - alert and oriented to person and place. Can state what day and month it is, but has trouble with year. She follows commands and answers questions appropriately although sometimes has some trouble remembering some things.  Eyes - pupils equal and reactive, extraocular eye movements intact Mouth - dry mucous membranes, decaying front lower teeth Chest - clear to auscultation, no wheezes, rales or rhonchi, symmetric air entry Heart - S1S2, regular rhythm, mildly tachycardic, 3/6 systolic murmur  heard diffusely over chest.  Abdomen - soft, nontender, nondistended, no masses or organomegaly Neurological - CN2-12 grossly intact, 5/5 strength in upper extremities bilaterally, 3/5 strength in right leg, 4/5 strength in left leg.  Extremities - dorsalis pedis pulses difficult to palpate in both legs, no edema.  Skin - echymoses on arms bilaterally. Right groin area: would healing by secondary intention, with granulation tissue present Small ulcer on left heel  Assessment/Plan 78 yo female with h/o DM, HTN, PVD who was recently hospitalized on 08/29/11 and discharged on 03/05 for right groin infection from a recent right axillobifemoral bypass who presents with nausea, vomiting and diarrhea and admitted for IV hydration.   1. Diarrhea: that appears to have lasted for about a week now. c-diff was negative in ED. She was c-diff negative last week as well. Flagyl doesn't seem to have helped with diarrhea symptoms. This could be due to norovirus although it should perhaps have cleared by now.  There is no evidence of electrolyte abnormality at this time. - check norovirus PCR - IV fluids -  repeat BMP in am  2. Nausea/vomiting: this could be explained by flagyl given that symptoms coincide with start of medication. CT scan did not show any evidence of small bowel obstruction. Pancreatitis is also unlikely given normal lipase and absence of pain associated with symptoms.  - d/c flagyl - zofran 4mg  for nausea - IV fluids: NS at 110 cc/hr  3. Right femoral wound: appears in the healing stages although is still very much an open wound. Mild elevation in WBC with neutrophil predominance may be due to this chronic infection. No fever. There doesn't seem to be a need for antibiotics at this time. - consult wound care  4. Deconditioning: - no clear documentation of orthostatic hypotension. Will repeat orthostatics - PT/OT consult   5. DM:  - will hold home metformin since this can exacerbate stomach upset - Insulin sliding scale - check A1C  6.  HTN:  Patient's BP's have been in the 130s'-140's lying. Will not try to reduce blood pressure especially in the setting of potential orthostatic hypotension secondary to dehydration.  - continue home medications: HCTZ 25mg  daily, metoprolol 50mg  bid. Will readjust if there is significant orthostatic hypotension 7. Tachycardia: most likely secondary to dehydration. Will continue IV fluids.  8. Microcytic anemia: appears to be stable since 08/15/11. Was not anemic last year. Unclear etiology other than potential recovery from blood loss from recent vascular surgery. Can consider working this up with iron studies as an outpatient. No evidence of active bleeding.  9. Dementia: patient appears to have some mild memory loss. Will continue home aricept 5mg  10. Fen/Gi: - carb modified diet - NS at 110cc/hr - mylanta as per home regimen for GERD 11. PPx:  - lovenox subq 12. Social: patient was at [facility] before her last hospitalization. Given ED reports it looks like she is there now as well, although when [facility] was called today, they  reported not having her in their census listing. Will consult social work to help [place] sort this out.  13. Dispo: pending clinical improvement  LOSQ, STEPHANIE 09/11/2011, 2:01 AM  PGY-3 addendum: I have seen and examined the above patient with Dr. Gwenlyn Saran and I agree with her history, physical and assessment and plan as written above. In brief this is a 78 y.o. Female who presents to the [facility] ER with history of nausea/vomiting and weakness.  N/V resolved on arrival to [place].  Pt endorses persistent weakness.  Difficult to obtain history since we were not able to find current nursing home listed in chart ([facility] said that she was no longer a resident there) and pt was able to give history but she does have history of dementia and there were some inconsistencies in exam.  Physical exam is as per above in Dr. Gwenlyn Saran note. Right groin wound approximately quarter sized, no drainage, no pus. Plan is to admit pt to med surg.  Will give po challenge.  Will get wound consult to see right groin wound.  Will Have pt work with patient with walker (what she uses to ambulate at baseline).  Will monitor patient closely and hopefully discharge to rehab in the next day or so once these things are complete.   Libbi Towner S. Edmonia James, MD [facility] family medicine PGY-3

## 2011-09-11 NOTE — Evaluation (Addendum)
Occupational Therapy Evaluation Patient Details Name: Mackenzie [name] MRN: [id number] DOB: 03-17-1928 Today's Date: 09/11/2011  Problem List:  Patient Active Problem List  Diagnoses  . THYROID NODULE  . DIABETES MELLITUS II, UNCOMPLICATED  . MACULAR DEGENERATION  . HYPERTENSION, BENIGN SYSTEMIC  . PSORIASIS  . WALKING DIFFICULTY  . INCONTINENCE, MIXED, URGE/STRESS  . Osteoporosis, senile  . Depression  . Dementia  . Wrist pain  . Postoperative anemia due to acute blood loss  . DM (diabetes mellitus)  . PVD (peripheral vascular disease)  . HTN (hypertension)  . GERD (gastroesophageal reflux disease)  . CAD (coronary artery disease)  . Hypokalemia  . Abnormal TSH  . Cellulitis and abscess of trunk    Past Medical History:  Past Medical History  Diagnosis Date  . Delirium 12/10/08    hospitalized  . Pelvic fracture 07/01/09    hospitalized  . Coronary artery disease   . Femoral neck fracture   . Anemia   . Hypertension   . Diabetes mellitus    Past Surgical History:  Past Surgical History  Procedure Date  . Cholecystectomy   . Cataract extraction   . Axillary-femoral bypass graft 08/15/2011    Procedure: BYPASS GRAFT AXILLA-BIFEMORAL;  Surgeon: [name], MD;  Location: [facility] OR;  Service: Vascular;  Laterality: Right;  Embolectomy of Right Femoral Artery with Right  Axilla Femoral Bypass Graft.    OT Assessment/Plan/Recommendation OT Assessment Clinical Impression Statement: Pt. presents from SNF with N/V/D and with recent admission to the hospital. Pt. will benefit from skilled OT to increase functional independence with ADLs to min assist-supervision level at D/C. See flowsheet for orthostatic BP's-pt. Not orthostatic during session.   OT Recommendation/Assessment: Patient will need skilled OT in the acute care venue OT Problem List: Decreased strength;Decreased activity tolerance;Impaired balance (sitting and/or standing);Decreased safety awareness;Decreased  knowledge of use of DME or AE;Decreased cognition;Decreased knowledge of precautions Barriers to Discharge: None OT Therapy Diagnosis : Generalized weakness OT Plan OT Frequency: Min 2X/week OT Treatment/Interventions: Self-care/ADL training;DME and/or AE instruction;Therapeutic activities;Patient/family education;Balance training OT Recommendation Follow Up Recommendations: Skilled nursing facility Equipment Recommended: Defer to next venue Individuals Consulted Consulted and Agree with Results and Recommendations: Patient OT Goals Acute Rehab OT Goals OT Goal Formulation: With patient Time For Goal Achievement: 2 weeks ADL Goals Pt Will Perform Upper Body Bathing: with set-up;with supervision;Sitting, edge of bed ADL Goal: Upper Body Bathing - Progress: Goal set today Pt Will Perform Upper Body Dressing: with set-up;with supervision;Sitting, bed ADL Goal: Upper Body Dressing - Progress: Goal set today Pt Will Transfer to Toilet: with min assist;with DME;Ambulation;3-in-1 ADL Goal: Toilet Transfer - Progress: Goal set today Pt Will Perform Toileting - Hygiene: with set-up;Sitting on 3-in-1 or toilet ADL Goal: Toileting - Hygiene - Progress: Goal set today Additional ADL Goal #1: Pt. will complete EOB ADL tasks for ~10 mins with supervision to increase activity tolerance with ADLs. ADL Goal: Additional Goal #1 - Progress: Goal set today  OT Evaluation Precautions/Restrictions  Precautions Precautions: Fall Restrictions Weight Bearing Restrictions: No Prior Functioning Home Living Lives With: Alone Receives Help From: Personal care attendant Type of Home: House Home Layout: One level Home Access: Stairs to enter Entrance Stairs-Rails: Can reach both Entrance Stairs-Number of Steps: 3 Bathroom Shower/Tub: Psychologist, counselling;Door [employer] Toilet: Standard Bathroom Accessibility: Yes How Accessible: Accessible via walker Home Adaptive Equipment: Raised toilet seat with  rails;Shower chair with back;Straight cane;Walker - standard Additional Comments: Pt. with h/o dementia; at SNF after recent d/c  for rehab Prior Function Level of Independence: Needs assistance with tranfers;Needs assistance with gait;Needs assistance with homemaking Vocation: Retired ADL ADL Eating/Feeding: Performed;Set up Where Assessed - Eating/Feeding: Chair Grooming: Performed;Wash/dry face;Set up Where Assessed - Grooming: Sitting, bed Upper Body Bathing: Simulated;Chest;Right arm;Left arm;Abdomen;Minimal assistance Where Assessed - Upper Body Bathing: Sitting, bed Lower Body Bathing: Simulated;Maximal assistance Where Assessed - Lower Body Bathing: Sit to stand from bed Upper Body Dressing: Performed;Minimal assistance Upper Body Dressing Details (indicate cue type and reason): with donning gown Where Assessed - Upper Body Dressing: Sitting, bed Lower Body Dressing: Simulated;Maximal assistance Where Assessed - Lower Body Dressing: Sit to stand from bed Toilet Transfer: Simulated;+2 Total assistance;Comment for patient % Toilet Transfer Details (indicate cue type and reason): pt=50% with mod verbal cues for hand placement Toilet Transfer Method: Stand pivot Toileting - Clothing Manipulation: Simulated;Maximal assistance Where Assessed - Toileting Clothing Manipulation: Sit on 3-in-1 or toilet Toileting - Hygiene: Simulated;Maximal assistance Where Assessed - Toileting Hygiene: Sit to stand from 3-in-1 or toilet Ambulation Related to ADLs: NA Vision/Perception  Vision - History Baseline Vision: No visual deficits   Extremity Assessment RUE Assessment RUE Assessment: Within Functional Limits LUE Assessment LUE Assessment: Within Functional Limits Mobility  Bed Mobility Bed Mobility: Yes Rolling Right: 5: Supervision;With rail Right Sidelying to Sit: 3: Mod assist Right Sidelying to Sit Details (indicate cue type and reason): Assist to elevate trunk off bed and for bil  LE off EOB Transfers Transfers: Yes Sit to Stand: 1: +2 Total assist;Patient percentage (comment);From elevated surface;From bed (pt=50%) Sit to Stand Details (indicate cue type and reason): Mod verbal cues for hand placement, upright position, and manual facilitation at hips to promote upright position    End of Session OT - End of Session Equipment Utilized During Treatment: Gait belt Activity Tolerance: Patient tolerated treatment well Patient left: in bed;with call bell in reach Nurse Communication: Mobility status for transfers General Behavior During Session: [facility] for tasks performed Cognition: Impaired, at baseline  Co-evaluation with Flora Lipps, OTR/L Pager [phone] 09/11/2011, 2:55 PM

## 2011-09-11 NOTE — Evaluation (Signed)
Physical Therapy Evaluation Patient Details Name: Mackenzie [name] MRN: [id number] DOB: 10/19/1927 Today's Date: 09/11/2011  Problem List:  Patient Active Problem List  Diagnoses  . THYROID NODULE  . DIABETES MELLITUS II, UNCOMPLICATED  . MACULAR DEGENERATION  . HYPERTENSION, BENIGN SYSTEMIC  . PSORIASIS  . WALKING DIFFICULTY  . INCONTINENCE, MIXED, URGE/STRESS  . Osteoporosis, senile  . Depression  . Dementia  . Wrist pain  . Postoperative anemia due to acute blood loss  . DM (diabetes mellitus)  . PVD (peripheral vascular disease)  . HTN (hypertension)  . GERD (gastroesophageal reflux disease)  . CAD (coronary artery disease)  . Hypokalemia  . Abnormal TSH  . Cellulitis and abscess of trunk    Past Medical History:  Past Medical History  Diagnosis Date  . Delirium 12/10/08    hospitalized  . Pelvic fracture 07/01/09    hospitalized  . Coronary artery disease   . Femoral neck fracture   . Anemia   . Hypertension   . Diabetes mellitus    Past Surgical History:  Past Surgical History  Procedure Date  . Cholecystectomy   . Cataract extraction   . Axillary-femoral bypass graft 08/15/2011    Procedure: BYPASS GRAFT AXILLA-BIFEMORAL;  Surgeon: [name], MD;  Location: [facility] OR;  Service: Vascular;  Laterality: Right;  Embolectomy of Right Femoral Artery with Right  Axilla Femoral Bypass Graft.    PT Assessment/Plan/Recommendation PT Assessment Clinical Impression Statement: pt adm with N/V/diarrhea, ?Norovirus.  Pt remains significantly weak with decr activity tolerance, decr. balance and mobility in general..Pt  willl benefit from ST SNF PT Recommendation/Assessment: Patient will need skilled PT in the acute care venue PT Problem List: Decreased strength;Decreased range of motion;Decreased activity tolerance;Decreased balance;Decreased mobility;Decreased knowledge of use of DME;Impaired sensation PT Therapy Diagnosis : Difficulty walking;Generalized weakness PT  Plan PT Frequency: Min 3X/week PT Treatment/Interventions: DME instruction;Gait training;Functional mobility training;Therapeutic activities;Balance training;Patient/family education PT Recommendation Follow Up Recommendations: Skilled nursing facility Equipment Recommended: Defer to next venue PT Goals  Acute Rehab PT Goals PT Goal Formulation: With patient Time For Goal Achievement: 2 weeks Pt will go Supine/Side to Sit: with min assist PT Goal: Supine/Side to Sit - Progress: Goal set today Pt will Sit at [facility] of Bed: with supervision;with no upper extremity support;3-5 min PT Goal: Sit at Edge Of Bed - Progress: Goal set today Pt will go Sit to Supine/Side: with min assist PT Goal: Sit to Supine/Side - Progress: Goal set today Pt will go Sit to Stand: with min assist PT Goal: Sit to Stand - Progress: Goal set today Pt will go Stand to Sit: with supervision PT Goal: Stand to Sit - Progress: Goal set today Pt will Transfer Bed to Chair/Chair to Bed: with min assist PT Transfer Goal: Bed to Chair/Chair to Bed - Progress: Goal set today Pt will Ambulate: 1 - 15 feet;with mod assist;with least restrictive assistive device PT Goal: Ambulate - Progress: Goal set today  PT Evaluation Precautions/Restrictions  Precautions Precautions: Fall Precaution Comments: Noted pt has Lt hip hemiarthroplasty on CT of pelvis Restrictions Weight Bearing Restrictions: No Prior Functioning  Home Living Lives With: Alone Receives Help From: Personal care attendant Type of Home: House Home Layout: One level Home Access: Stairs to enter Entrance Stairs-Rails: Can reach both Entrance Stairs-Number of Steps: 3 Bathroom Shower/Tub: Psychologist, counselling;Door [employer] Toilet: Standard Bathroom Accessibility: Yes How Accessible: Accessible via walker Home Adaptive Equipment: Raised toilet seat with rails;Shower chair with back;Straight cane;Walker - standard Additional Comments:  Pt. with h/o dementia; at  SNF after recent d/c for rehab Prior Function Level of Independence: Needs assistance with tranfers;Needs assistance with gait;Needs assistance with homemaking Vocation: Retired Financial risk analyst Arousal/Alertness: Awake/alert Overall Cognitive Status: Appears within functional limits for tasks assessed History of Cognitive Impairment: Appears at baseline functioning Orientation Level: Oriented X4 Sensation/Coordination Sensation Light Touch: Appears Intact Coordination Gross Motor Movements are Fluid and Coordinated: Yes Fine Motor Movements are Fluid and Coordinated: Not tested Extremity Assessment RUE Assessment RUE Assessment: Within Functional Limits LUE Assessment LUE Assessment: Within Functional Limits RLE Assessment RLE Assessment: Exceptions to [facility] RLE Strength RLE Overall Strength: Deficits RLE Overall Strength Comments: much more functional grossly 3+/5 LLE Assessment LLE Assessment: Exceptions to [facility] LLE Strength LLE Overall Strength: Deficits LLE Overall Strength Comments: grossly 3+/5, df limited to about neutral Mobility (including Balance) Bed Mobility Bed Mobility: Yes Rolling Right: 5: Supervision Right Sidelying to Sit: 3: Mod assist Right Sidelying to Sit Details (indicate cue type and reason): mod truncal assist; vc for hand placement Sit to Supine: With rail;HOB flat;3: Mod assist Sit to Supine - Details (indicate cue type and reason): assist mostly to help LE's  Transfers Transfers: Yes Sit to Stand: From elevated surface;From bed;1: +2 Total assist;Patient percentage (comment) (pt=50%) Sit to Stand Details (indicate cue type and reason): vc's for hand placment; manual assist to help patient come forward over her BOS Stand to Sit: To bed;4: Min assist Ambulation/Gait Ambulation/Gait: No Stairs: No  Posture/Postural Control Posture/Postural Control: No significant limitations Balance Balance Assessed: Yes Static Sitting Balance Static  Sitting - Balance Support: Right upper extremity supported;Left upper extremity supported;Feet supported Static Sitting - Level of Assistance: 5: Stand by assistance Static Sitting - Comment/# of Minutes: 5 Static Standing Balance Static Standing - Balance Support: Left upper extremity supported;Right upper extremity supported;During functional activity Static Standing - Level of Assistance: 3: Mod assist Exercise    End of Session PT - End of Session Activity Tolerance: Patient tolerated treatment well;Patient limited by fatigue Patient left: in bed;with call bell in reach Nurse Communication: Mobility status for transfers General Behavior During Session: [facility] for tasks performed Cognition: Impaired, at baseline  Jovanna Hodges, Eliseo Gum 09/11/2011, 3:30 PM  09/11/2011  [place] Bing, PT [phone] (pager)

## 2011-09-12 LAB — CBC
HCT: 29.3 % — ABNORMAL LOW (ref 36.0–46.0)
Hemoglobin: 8.6 g/dL — ABNORMAL LOW (ref 12.0–15.0)
RBC: 3.91 MIL/uL (ref 3.87–5.11)
RDW: 19.9 % — ABNORMAL HIGH (ref 11.5–15.5)
WBC: 4.8 10*3/uL (ref 4.0–10.5)

## 2011-09-12 LAB — GLUCOSE, CAPILLARY
Glucose-Capillary: 82 mg/dL (ref 70–99)
Glucose-Capillary: 91 mg/dL (ref 70–99)

## 2011-09-12 LAB — BASIC METABOLIC PANEL
BUN: 12 mg/dL (ref 6–23)
Calcium: 8 mg/dL — ABNORMAL LOW (ref 8.4–10.5)
Creatinine, Ser: 0.54 mg/dL (ref 0.50–1.10)
GFR calc Af Amer: >90 mL/min (ref >90)
GFR calc non Af Amer: 86 mL/min — ABNORMAL LOW (ref >90)
Glucose, Bld: 88 mg/dL (ref 70–99)

## 2011-09-12 MED ORDER — NYSTATIN 100000 UNIT/GM EX POWD
Freq: Two times a day (BID) | CUTANEOUS | Status: DC
  Administered 2011-09-12 – 2011-09-13 (×3): via TOPICAL
  Filled 2011-09-12 (×4): qty 15

## 2011-09-12 MED ORDER — MIRTAZAPINE 7.5 MG PO TABS
7.5000 mg | ORAL_TABLET | Freq: Every day | ORAL | Status: DC
  Administered 2011-09-12: 7.5 mg via ORAL
  Filled 2011-09-12 (×2): qty 1

## 2011-09-12 NOTE — Progress Notes (Signed)
Clinical Child psychotherapist (CSW) completed psychosocial assessment which can be found in pt shadow chart. CSW confirmed with pt that she is from [facility] and the plan is for her to return when medically stable. CSW contacted [facility] who informed CSW that pt is okay to return at dc. CSW received consent to contact pt daughter as pt was unsure if she knew pt was in the hospital.  CSW left a message for daughter Mackenzie [name] and contacted pt niece who stated she was already aware that pt was in the hospital and confirmed pt is to return to [facility] at [employer]. CSW will complete FL2 and continue to follow.  Mackenzie [name], MSW, Mackenzie [name] [phone]

## 2011-09-12 NOTE — Progress Notes (Signed)
I interviewed and examined this patient and discussed the care plan with Dr. Aviva Signs and the [facility] team and agree with assessment and plan as documented in the progress note for today. She reports poor appetite and considering her low albumin and insomnia, I would replace Sertraline with Mirtazepine 7.5 -15 mg. Hemacult of stool is still pending.    Haward Pope A. Sheffield Slider, MD Family Medicine Teaching Service Attending  09/12/2011 3:51 PM

## 2011-09-12 NOTE — Consult Note (Signed)
WOC consult Note Vascular team now following for assessment and plan of care.   Will not plan to follow further unless re-consulted.  [address] RN, MSN, [employer]  [phone]

## 2011-09-12 NOTE — Consult Note (Signed)
[facility] [place] CONSULT NOTE 09/12/2011 [id number]  CC right groin wound with retained staples. Referring Physician: Dr. Donnetta Hail  History of Present Illness: This is a 78 y.o. female recently had a right axillobifemoral bypass by Dr. Myra Gianotti for limb salvage. She was at [facility] and rehabilitation Center. She returns today with right groin wound problems 08-29-11.  She was in stable condition and discharged back to the SNF with dressing change instructions and flagyl for 7 days.  She was going to follow up in our office in 1 week after discharge.   She was brought to the ER 09-10-11 from her nursing facility with one day of nausea, vomiting diarrhea. Reports he is vomited 5 times since last night. She's also had loose watery stools have been "constant" she prolonged hospitalization from February 26 2 March 5 for postoperative wound infection of her aortofemoral bypass graft. She is on multiple antibiotics including Zosyn and Avelox which have been stopped. She was tested in the hospital twice for Clostridium difficile and tested negative. However she did have persistent diarrhea and was discharged on by mouth Flagyl. She complains of lower abdominal pain that started today. She denies any fever, chest pain, shortness of breath.       Past Medical History  Diagnosis Date  . Delirium 12/10/08    hospitalized  . Pelvic fracture 07/01/09    hospitalized  . Coronary artery disease   . Femoral neck fracture   . Anemia   . Hypertension   . Diabetes mellitus     Past Surgical History  Procedure Date  . Cholecystectomy   . Cataract extraction   . Axillary-femoral bypass graft 08/15/2011    Procedure: BYPASS GRAFT AXILLA-BIFEMORAL;  Surgeon: [name], MD;  Location: [facility] OR;  Service: Vascular;  Laterality: Right;  Embolectomy of Right Femoral Artery with Right  Axilla Femoral Bypass Graft.     ROS: [x]  Positive  [ ]  Denies    General: [ ]   Weight loss, [ ]  Fever, [ ]  chills Neurologic: [ ]  Dizziness, [ ]  Blackouts, [ ]  Seizure [ ]  Stroke, [ ]  "Mini stroke", [ ]  Slurred speech, [ ]  Temporary blindness; [ ]  weakness in arms or legs, [ ]  Hoarseness Cardiac: [ ]  Chest pain/pressure, [ ]  Shortness of breath at rest [ ]  Shortness of breath with exertion, [ ]  Atrial fibrillation or irregular heartbeat Vascular: [ ]  Pain in legs with walking, [ ]  Pain in legs at rest, [ ]  Pain in legs at night,  [ ]  Non-healing ulcer, [ ]  Blood clot in vein/DVT,   Pulmonary: [ ]  Home oxygen, [ ]  Productive cough, [ ]  Coughing up blood, [ ]  Asthma,  [ ]  Wheezing Musculoskeletal:  [ ]  Arthritis, [ ]  Low back pain, [ ]  Joint pain Hematologic: [ ]  Easy Bruising, [ ]  Anemia; [ ]  Hepatitis Gastrointestinal: [ ]  Blood in stool, [ ]  Gastroesophageal Reflux/heartburn, [ ]  Trouble swallowing Urinary: [ ]  chronic Kidney disease, [ ]  on HD - [ ]  MWF or [ ]  TTHS, [ ]  Burning with urination, [ ]  Difficulty urinating Skin: [ ]  Rashes, [ ]  Wounds Psychological: [ ]  Anxiety, [ ]  Depression  Social History History  Substance Use Topics  . Smoking status: Former Games developer  . Smokeless tobacco: Never Used  . Alcohol Use: No    Family History Family History  Problem Relation Age of Onset  . Heart failure Father     died age  57  . Stroke Mother     died age 45    No Known Allergies  Current Facility-Administered Medications  Medication Dose Route Frequency Provider Last Rate Last Dose  . 0.9 %  sodium chloride infusion   Intravenous Continuous Lonia Skinner, MD 110 mL/hr at 09/12/11 0715    . alum & mag hydroxide-simeth (MAALOX PLUS) 400-400-40 MG/5ML suspension 30 mL  30 mL Oral Q6H PRN Lonia Skinner, MD      . donepezil (ARICEPT) tablet 5 mg  5 mg Oral QHS Lonia Skinner, MD   5 mg at 09/11/11 2133  . enoxaparin (LOVENOX) injection 40 mg  40 mg Subcutaneous Q24H Lonia Skinner, MD   40 mg at 09/11/11 0950  . hydrochlorothiazide (HYDRODIURIL) tablet  25 mg  25 mg Oral Daily Lonia Skinner, MD   25 mg at 09/11/11 0950  . insulin aspart (novoLOG) injection 0-15 Units  0-15 Units Subcutaneous TID WC Lonia Skinner, MD      . insulin aspart (novoLOG) injection 0-5 Units  0-5 Units Subcutaneous QHS Lonia Skinner, MD      . metoprolol (LOPRESSOR) tablet 50 mg  50 mg Oral BID Lonia Skinner, MD   50 mg at 09/11/11 2133  . ondansetron (ZOFRAN) tablet 4 mg  4 mg Oral Q6H PRN Lonia Skinner, MD       Or  . ondansetron (ZOFRAN) injection 4 mg  4 mg Intravenous Q6H PRN Lonia Skinner, MD      . oxyCODONE (Oxy IR/ROXICODONE) immediate release tablet 5 mg  5 mg Oral Q4H PRN Lonia Skinner, MD      . potassium chloride SA (K-DUR,KLOR-CON) CR tablet 40 mEq  40 mEq Oral Once Lonia Skinner, MD   40 mEq at 09/11/11 0950  . potassium chloride SA (K-DUR,KLOR-CON) CR tablet 40 mEq  40 mEq Oral BID Dayarmys Pil[place] de Criselda Peaches, MD   40 mEq at 09/11/11 2133  . sertraline (ZOLOFT) tablet 50 mg  50 mg Oral QHS Lonia Skinner, MD   50 mg at 09/11/11 2133  . simvastatin (ZOCOR) tablet 40 mg  40 mg Oral QHS Lonia Skinner, MD   40 mg at 09/11/11 2133   Facility-Administered Medications Ordered in Other Encounters  Medication Dose Route Frequency Provider Last Rate Last Dose  . ondansetron (ZOFRAN) tablet 4 mg  4 mg Oral Q6H PRN Lars Mage, PA       Or  . ondansetron [facility]) 4 mg in sodium chloride 0.9 % 50 mL IVPB  4 mg Intravenous Q6H PRN Lars Mage, PA         Imaging: Ct Abdomen Pelvis W Contrast  09/10/2011  *RADIOLOGY REPORT*  Clinical Data: Emesis.  CT ABDOMEN AND PELVIS WITH CONTRAST  Technique:  Multidetector CT imaging of the abdomen and pelvis was performed following the standard protocol during bolus administration of intravenous contrast.  Contrast: OMNIPAQUE IOHEXOL 300 MG/ML IJ SOLN  Comparison: Multiple priors, most recently a CT of pelvis 08/14/2011, and CT of the abdomen and pelvis dated 07/01/2009.None.  Findings:   Lung Bases: There is a large hiatal hernia. Atherosclerosis in the descending thoracic aorta.  Abdomen/Pelvis:  The patient is status post cholecystectomy.  There is moderate intrahepatic biliary ductal dilatation (similar to remote prior study from 07/01/2009.  No extrahepatic biliary ductal dilatation (common bile duct measures 7 mm in the porta hepatis). There are a few calcifications scattered throughout  the liver and spleen, consistent with calcified granulomas.  No other focal hepatic or splenic lesions are noted.  The enhanced appearance of the pancreas, bilateral adrenal glands and right kidney is unremarkable.  There is a punctate 1-2 mm calcification in the interpolar region of the left kidney (image 30 of series 2), favored to be of vascular origin (less likely represent a small nonobstructive calculus).  Numerous colonic diverticula are noted, most pronounced in the region of the sigmoid colon, without surrounding inflammatory changes to suggest acute diverticulitis at this time.  Uterus and ovaries are atrophic.  In the left ovary there is a 1.4 cm intermediate attenuation (28 HU) lesion which is unchanged in retrospect compared to the prior study 07/01/2009, presumably benign.  Portions of the pelvis are largely obscured by extensive beam hardening artifact from the patient's left-sided total hip prosthesis, however, the visualized portions of the urinary bladder are unremarkable.  Laxity of the anterior abdominal wall musculature, without focal hernia.  The patient is status post aortobifemoral bypass graft.  There is complete occlusion of the native infrarenal abdominal aorta and bilateral common iliac arteries. There is there is also complete occlusion of the aortabifemoral bypass graft. There is reconstitution of the arterial flow in the left external and internal iliac artery branches (this is less confident on the right side, however, a recent CT 02/02/2011 2013 and did demonstrate reconstitution  of flow within the native vessels on the right). The patient is status post axillofemoral bypass to right common femoral artery (visualized portions of the bypass graft are patent).  The celiac axis, SMA, bilateral renal arteries and inferior mesenteric artery are all patent.  Notably, the origin of the inferior mesenteric artery is occluded, however, distal branches are reconstituted, presumably from collateral flow.  Musculoskeletal: Status post left total hip arthroplasty.  Healed fracture of the right inferior pubic ramus.  Mild osteopenia. There are no aggressive appearing lytic or blastic lesions noted in the visualized portions of the skeleton.  Multiple healed lower right rib fractures laterally incidentally noted.  There are compression fractures at T11 and L2 with approximately 50% loss of height at both levels (increased compared to prior examination 07/01/2009). However, axial images do not demonstrate any surrounding soft tissue swelling to suggest acute compression fractures on today's examination (and findings are similar to prior study 08/15/2011).  IMPRESSION: 1. 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  There has been interval placement of a right-sided axillary femoral bypass graft which appears patent and supplies the right common femoral artery.  Reconstitution of flow in the left internal and external iliac arteries is noted.  Although reconstitution of flow in the right external and internal iliac arteries was noted on prior CT angiogram dated 08/15/2011, flow cannot be confirmed within these vessels on today's examination (this may be an artifact of bolus timing). 2.  No definite acute findings in the abdomen or pelvis to account for the patient's symptoms.  Specifically, no signs of bowel obstruction. 3.  Colonic diverticulosis without findings to suggest  acute diverticulitis. 4.  Status post cholecystectomy and left total hip arthroplasty. 5.  Compression fractures at T11 and L2 with approximately 50% loss of height at both levels.  Original Report Authenticated By: Florencia Reasons, M.D.    Significant Diagnostic Studies: CBC Lab Results  Component Value Date   WBC 4.3 09/11/2011   HGB 8.3* 09/11/2011  HCT 28.1* 09/11/2011   MCV 74.7* 09/11/2011   PLT 286 09/11/2011    BMET    Component Value Date/Time   NA 141 09/12/2011 0525   K 4.5 09/12/2011 0525   CL 111 09/12/2011 0525   CO2 24 09/12/2011 0525   GLUCOSE 88 09/12/2011 0525   BUN 12 09/12/2011 0525   CREATININE 0.54 09/12/2011 0525   CREATININE 0.58 05/24/2011 1046   CALCIUM 8.0* 09/12/2011 0525   GFRNONAA 86* 09/12/2011 0525   GFRAA >90 09/12/2011 0525    COAG Lab Results  Component Value Date   INR 1.09 08/29/2011   INR 0.98 08/15/2011   INR 1.03 07/01/2009   No results found for this basename: PTT     Physical Examination  Patient Vitals for the past 24 hrs:  BP Temp Temp src Pulse Resp SpO2 Weight  09/12/11 0328 153/72 mmHg 98.3 F (36.8 C) Oral 85  22  94 % 167 lb 12.3 oz (76.1 kg)  09/11/11 2119 149/74 mmHg 99.1 F (37.3 C) - 88  20  97 % -  09/11/11 1408 186/73 mmHg - - - - - -  09/11/11 1405 166/77 mmHg - - - - - -  09/11/11 1403 138/71 mmHg - - - - - -  09/11/11 1345 135/70 mmHg 98.8 F (37.1 C) Oral 90  20  99 % -   Pulse Readings from Last 3 Encounters:  09/12/11 85  09/06/11 89  08/29/11 99    General:  WDWN in NAD    Pulmonary: normal non-labored breathing , without Rales, rhonchi,  wheezing Cardiac: RRR, without  Murmurs, rubs or gallops; No carotid bruits Abdomen: soft, NT, no masses Skin: no rashes extremity skin color normal and warm to touch Vascular Exam/Pulses:Extremities without ischemic changes, no Gangrene ;  open wounds right groin no active drainage, still has yeast appearance around medial wound Musculoskeletal: no muscle wasting  or atrophy  Neurologic: A&O X 3; Appropriate Affect ;  SENSATION: normal; MOTOR FUNCTION:  moving all extremities equally.  Speech is fluent/normal  Pending final stool cultures for C-diff Will start nystatin powder with dry dressing ASSESSMENT:Chronic right groin wound PLAN:will discuss plan with Dr. Myra Gianotti    Agree with above  Durene Cal

## 2011-09-12 NOTE — Progress Notes (Signed)
Daily Progress Note Family Medicine Teaching Service PGY-1   Patient name: Mackenzie [name]  Medical record number:[id number] Date of birth:10-16-1927 Age: 78 y.o. Gender: female  LOS: 2 days   Subjective: Feeling better, she does not recall diarrheas but we saw her today with dark soft stools on pad. Afebrile.  Objective: Vitals: Patient Vitals for the past 24 hrs:  BP Temp Temp  P R SpO Weight  09/12/11 0328 153/72 mmHg 98.3 F (36.8 C) Oral 85  22  94 % 167 lb 12.3 oz (76.1 kg)    Physical Exam: Gen:  NAD HEENT: Moist mucous membranes CV: Regular rate and rhythm, II/VI systolic murmurs audible on precordium. No rubs or gallops PULM: Clear to auscultation bilaterally. No wheezes/rales/rhonchi ABD: Soft, non tender, non distended, normal bowel sounds EXT: No edema. Pedal pulses are diminished bilaterally.   Right Groin surgical incision with staples still present. Upper portion of incision with deshicence and new granular tissue present. Serous and greenish drainage present. Left heal with pressure ulcer present. Neuro: Alert and oriented to person, place and time(year/month). 1 recalled object ot of three and more than 10 seconds to answer it. No neurologic(motor or sensitive) focalization.   Labs and imaging:  CBC  Lab 09/11/11 0600 09/10/11 0930 09/06/11 0500  WBC 4.3 11.8* 5.0  HGB 8.3* 9.9* 8.0*  HCT 28.1* 33.6* 26.7*  PLT 286 334 306   BMET  Lab 09/12/11 0525 09/11/11 1506 09/11/11 0600  NA 141 142 142  K 4.5 3.5 2.9*  CL 111 108 105  CO2 24 26 25   BUN 12 13 14   CREATININE 0.54 0.54 0.56  LABGLOM -- -- --  GLUCOSE 88 -- --  CALCIUM 8.0* 8.2* 8.1*   CBG (last 3)   Basename 09/12/11 0634 09/11/11 2120 09/11/11 1631  GLUCAP 82 87 103*      Medications: Scheduled Meds:   . donepezil  5 mg Oral QHS  . enoxaparin  40 mg Subcutaneous Q24H  . hydrochlorothiazide  25 mg Oral Daily  . insulin aspart  0-15 Units Subcutaneous TID WC  . insulin aspart  0-5  Units Subcutaneous QHS  . metoprolol  50 mg Oral BID  . nystatin   Topical BID  . potassium chloride  40 mEq Oral BID  . sertraline  50 mg Oral QHS  . simvastatin  40 mg Oral QHS   Continuous Infusions:   . sodium chloride 110 mL/hr at 09/12/11 0715   PRN Meds:.alum & mag hydroxide-simeth, ondansetron (ZOFRAN) IV, ondansetron, oxyCODONE Assessment and Plan:  78 yo female with h/o DM, HTN, PVD who was recently hospitalized on 08/29/11 and discharged on 03/05 for right groin infection from a recent right axillobifemoral bypass who presents with nausea, vomiting and diarrhea and admitted for IV hydration.   1. Diarrhea: that appears to have lasted for about a week now. Have partially resolved in frequency, today 1 dark stool. c-diff was negative X 2 We are concerned for Gi bleed since pt is anemic since surgery. - Norovirus pending - Fecal Hemocult. -CBC  2. Nausea/vomiting:  Resolved after Flagyl had stopped. Hypokalemia resolved with 3 Kcl. 40 mg. Magnesium borderline low. Could be residual from diarrhea. Will not replace at this time. Pt has good PO.  3. Right femoral wound: appears in the healing stages of an open wound. Mild elevation in WBC with neutrophil predominance may be due to this chronic infection. No fever. There doesn't seem to be a need for antibiotics at this  time. - Wound care and Vascular Surgery involved in her care. We appreciate their consult and will f/u recommendations.  4. Deconditioning: - PT/OT recommended SNF. SW contacted her SNF and they will like to take pt back once she is medically stable.  5. DM:  Normal CBG's - hold home metformin - SSI  6.  HTN:   BP's have been in the 130s'-140's. - continue home medications: HCTZ 25mg  daily, metoprolol 50mg  bid.   7. Tachycardia: mild not sustained. Could be secondary to anemia. Dehydration resolved.  -CBC to trend Hb  8. Microcytic anemia: appears to be stable since 08/15/11. Was not anemic last year.  -  Fecal Hemocult   -CBC monitoring  9. Dementia: Moderated memory loss.  - Continue home aricept 5mg   FEN/GI:  CHO modified diet. Saline lock IV Prophylaxis:  Lovenox. Will probably transition to SCDs Disposition: pending improvement. Social: [facility] will take patient back once is medically stable.  D. [place] Rolene Arbour, MD PGY1, [facility] Medicine Teaching Service Pager [phone] 09/12/2011

## 2011-09-13 ENCOUNTER — Encounter: Payer: Self-pay | Admitting: Internal Medicine

## 2011-09-13 LAB — GLUCOSE, CAPILLARY
Glucose-Capillary: 71 mg/dL (ref 70–99)
Glucose-Capillary: 90 mg/dL (ref 70–99)

## 2011-09-13 LAB — URINE CULTURE
Colony Count: NO GROWTH
Culture  Setup Time: 201303130324

## 2011-09-13 LAB — CBC
Hemoglobin: 8.9 g/dL — ABNORMAL LOW (ref 12.0–15.0)
MCH: 22.1 pg — ABNORMAL LOW (ref 26.0–34.0)
RBC: 4.02 MIL/uL (ref 3.87–5.11)

## 2011-09-13 LAB — STOOL CULTURE

## 2011-09-13 MED ORDER — NYSTATIN 100000 UNIT/GM EX POWD: CUTANEOUS | Status: DC

## 2011-09-13 MED ORDER — MIRTAZAPINE 7.5 MG PO TABS: 7.5000 mg | ORAL_TABLET | Freq: Every day | ORAL | Status: AC

## 2011-09-13 MED ORDER — DONEPEZIL HCL 5 MG PO TABS: 5.0000 mg | ORAL_TABLET | Freq: Every day | ORAL | Status: DC

## 2011-09-13 MED ORDER — FERROUS SULFATE 325 (65 FE) MG PO TABS: 325.0000 mg | ORAL_TABLET | Freq: Three times a day (TID) | ORAL | Status: DC

## 2011-09-13 NOTE — Progress Notes (Signed)
Physical Therapy Treatment Patient Details Name: Mackenzie [name] MRN: [id number] DOB: 14-Oct-1927 Today's Date: 09/13/2011  PT Assessment/Plan  PT - Assessment/Plan Comments on Treatment Session: slowly improving strength, balance a problem with decr. ability to w/shift L.  Quick to fatigue, but noticeable improvement. Follow Up Recommendations: Skilled nursing facility Equipment Recommended: Defer to next venue PT Goals  Acute Rehab PT Goals PT Goal: Supine/Side to Sit - Progress: Progressing toward goal PT Goal: Sit at [facility] Of Bed - Progress: Progressing toward goal PT Goal: Sit to Stand - Progress: Progressing toward goal PT Goal: Stand to Sit - Progress: Progressing toward goal PT Transfer Goal: Bed to Chair/Chair to Bed - Progress: Progressing toward goal PT Goal: Ambulate - Progress: Progressing toward goal  PT Treatment Precautions/Restrictions  Precautions Precautions: Fall Precaution Comments: Noted pt has Lt hip hemiarthroplasty on CT of pelvis Restrictions Weight Bearing Restrictions: No Mobility (including Balance) Bed Mobility Bed Mobility: Yes Right Sidelying to Sit: 3: Mod assist Right Sidelying to Sit Details (indicate cue type and reason): via R elbow; mod truncal assist at Pt<=75%; vc/tc's for R hand placement for best technique Transfers Sit to Stand: With upper extremity assist;From bed;1: +2 Total assist;Patient percentage (comment);Other (comment) (pt =50-60 %) Sit to Stand Details (indicate cue type and reason): vc's for hand placement; assist to help pt come forward over BOS Stand to Sit: 4: Min assist;To bed;With upper extremity assist Stand to Sit Details: vc's for hand placement and to wait until legs touch seating surface Ambulation/Gait Ambulation/Gait: Yes Ambulation/Gait Assistance: 1: +2 Total assist;Patient percentage (comment) (pt =50%) Ambulation/Gait Assistance Details (indicate cue type and reason): +2 assist for stability and help maneuvering  the RW; gait was effortful with decr ability to W/shift to the Left Ambulation Distance (Feet): 4 Feet (feet forward and back (times 2)) Assistive device: Rolling walker Gait Pattern: Step-through pattern;Step-to pattern;Decreased step length - right;Decreased step length - left;Decreased stride length;Decreased stance time - left;Shuffle Stairs: No  Posture/Postural Control Posture/Postural Control: No significant limitations Balance Balance Assessed: Yes Static Sitting Balance Static Sitting - Balance Support: Right upper extremity supported;Left upper extremity supported;Feet supported Static Sitting - Level of Assistance: 5: Stand by assistance Static Standing Balance Static Standing - Balance Support: Bilateral upper extremity supported;During functional activity Static Standing - Level of Assistance: 3: Mod assist Exercise    End of Session PT - End of Session Activity Tolerance: Patient tolerated treatment well;Patient limited by fatigue Patient left: in chair;with call bell in reach Nurse Communication: Mobility status for transfers General Behavior During Session: [facility] for tasks performed Cognition: Impaired, at baseline (though functional for the task)  Wilburta Milbourn, Eliseo Gum 09/13/2011, 2:38 PM  09/13/2011  Henderson Bing, PT [phone] (pager)

## 2011-09-13 NOTE — Progress Notes (Signed)
Clinical Child psychotherapist (CSW) informed that pt ready for dc today. CSW informed pt and confirmed with [facility] that pt okay to go today. CSW will facilitate with dc to [facility] when dc summary complete.  Theresia Bough, MSW, Theresia Majors [phone]

## 2011-09-13 NOTE — Discharge Summary (Signed)
Physician Discharge Summary  Patient ID: Mackenzie [name] [id number] June 14, 1928 78 y.o.  Admit date: 09/10/2011 Discharge date: 09/13/2011  PCP: Mackenzie Living, MD, MD   Discharge Diagnosis: 1. Diarrhea 2. GI bleed 3. DM 4. Pressure ulcer on left heal. 5. R femoral wound s/p axilary-femoral bypass. 6. Dementia 7. Microcytic anemia 8. Deconditioning.   Discharge Medications  Mackenzie [name], Mackenzie [name]  Home Medication Instructions [id number]   Printed on:09/13/11 1423  Medication Information                    simvastatin (ZOCOR) 40 MG tablet Take 1 tablet (40 mg total) by mouth at bedtime.           metoprolol (LOPRESSOR) 50 MG tablet Take 1 tablet (50 mg total) by mouth 2 (two) times daily.           hydrochlorothiazide (HYDRODIURIL) 25 MG tablet Take 1 tablet (25 mg total) by mouth daily.           nitroGLYCERIN (NITRODUR - DOSED IN MG/24 HR) 0.3 mg/hr Place 1 patch onto the skin daily.           donepezil (ARICEPT) 5 MG tablet Take 5 mg by mouth at bedtime.           sertraline (ZOLOFT) 50 MG tablet Take 50 mg by mouth at bedtime.           OVER THE COUNTER MEDICATION Take 1 capsule by mouth 2 (two) times daily. Vitamin B complex with iron intrinsic factor           magnesium hydroxide (MILK OF MAGNESIA) 400 MG/5ML suspension Take 30 mLs by mouth at bedtime as needed. For constipation.           alum & mag hydroxide-simeth (MYLANTA DOUBLE-STRENGTH) 400-400-40 MG/5ML suspension Take 30 mLs by mouth every 6 (six) hours as needed. dyspepsia           bisacodyl (DULCOLAX) 10 MG suppository Place 10 mg rectally as needed. For constipation           insulin aspart (NOVOLOG) 100 UNIT/ML injection Inject 0-8 Units into the skin 3 (three) times daily before meals.            enoxaparin (LOVENOX) 30 MG/0.3ML SOLN Inject 30 mg into the skin every 12 (twelve) hours.            oxyCODONE (OXY IR/ROXICODONE) 5 MG immediate release tablet Take 1 tablet (5 mg total) by  mouth every 4 (four) hours as needed. For severe pain           metroNIDAZOLE (FLAGYL) 500 MG tablet Take 1 tablet (500 mg total) by mouth 3 (three) times daily.           metFORMIN (GLUCOPHAGE) 1000 MG tablet Take 1,000 mg by mouth daily.              Consults: GI will f/u as outpatinet. Child psychotherapist.   Labs: CBC  Lab 09/13/11 1132 09/12/11 1253 09/11/11 0600  WBC 4.8 4.8 4.3  HGB 8.9* 8.6* 8.3*  HCT 29.8* 29.3* 28.1*  PLT 271 282 286   BMET  Lab 09/12/11 0525 09/11/11 1506 09/11/11 0600 09/10/11 0930  NA 141 142 142 --  K 4.5 3.5 2.9* --  CL 111 108 105 --  CO2 24 26 25  --  BUN 12 13 14  --  CREATININE 0.54 0.54 0.56 --  CALCIUM 8.0* 8.2* 8.1* --  PROT -- -- -- 6.1  BILITOT -- -- -- 0.2*  ALKPHOS -- -- -- 123*  ALT -- -- -- 9  AST -- -- -- 22  GLUCOSE 88 109* 82 --     Procedures/Imaging:  Ct Abdomen Pelvis W Contrast 09/10/2011.  IMPRESSION: 1. 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  There has been interval placement of a right-sided axillary femoral bypass graft which appears patent and supplies the right common femoral artery.  Reconstitution of flow in the left internal and external iliac arteries is noted.  Although reconstitution of flow in the right external and internal iliac arteries was noted on prior CT angiogram dated 08/15/2011, flow cannot be confirmed within these vessels on today's examination (this may be an artifact of bolus timing). 2.  No definite acute findings in the abdomen or pelvis to account for the patient's symptoms.  Specifically, no signs of bowel obstruction. 3.  Colonic diverticulosis without findings to suggest acute diverticulitis. 4.  Status post cholecystectomy and left total hip arthroplasty. 5.  Compression fractures at T11 and L2 with approximately 50% loss of height at both levels.  Original  Report Authenticated By: Mackenzie [name], M.D.   Brief Hospital Course: 78 yo female with h/o DM, HTN, PVD who was recently hospitalized on 08/29/11 and discharged on 03/05 for right groin infection from a recent right axillobifemoral bypass who presents with nausea, vomiting and diarrhea and admitted for IV hydration.  1. Diarrhea, nausea/vomiting and tachycardia: that resolved after stopping flagyl. c-diff was negative X 2 and stool cultures were also  negative. Pt was Hemocult positive. Gi consulted. They will like to f/u as outpatient since pt is stable. - Norovirus pending   2. Right femoral wound: appears in the healing stages of an open wound. Normal WBC. No fever. Staples were removed. - Wound care and Vascular Surgery involved in her care. Recommended  dry dressings and nystatin powder BID. Pt should F/u with vascular as outpatient.   3. Pressure ulcer on left heal: wound care consulted.  4. Deconditioning:  - PT/OT recommended SNF. SW contacted her SNF and they will like to take pt back once she is medically stable.  - Mirtazepine started on this hospitalization. Discontinued prior SSRI   5. DM: Normal CBG's  -on SSI while inpatient A1C 6.5. -continue with metformin and outpatient f/u.  6. HTN:  Isolated systolic hypertension.  - no changes on home medications: HCTZ 25mg  daily, metoprolol 50mg  bid.   7. Microcytic anemia: appears to be stable since 08/15/11. Was not anemic last year. CBC stable around 8-9. Discharged home on Ferrous Sulfate TID and f/u as outpatient.  8. Dementia: Moderated memory loss.  - Continue home aricept 5mg    Patient condition at time of discharge/disposition:  Patient is discharge to SNF on stable medical condition.   Follow up issues: 1.GI Bleed.  Pt has a[ppyt with Dr. Juanda [name] April 16th at 1:45pm.  Mackenzie [name] Mackenzie Arbour, MD  [facility] 09/13/2011

## 2011-09-13 NOTE — Progress Notes (Signed)
DC Tele, DC IV, DC SNF. Discharge instructions and home medications information send with patient to SNF. Discharge paperwork handled by Child psychotherapist. Patient leaving unit via ambulance and appears in no acute distress.

## 2011-09-13 NOTE — Progress Notes (Signed)
I interviewed and examined this patient and discussed the care plan with Dr. Aviva Signs and the [facility] team and agree with assessment and plan as documented in the progress note for today. She denies being lightheaded when she stands. Her appetite is good. She does complain of left heel pain when she stands due to the pressure sore. On exam that sore has superficial desquamation with underlying deep tissue injury.    Courtney Bellizzi A. Sheffield Slider, MD Family Medicine Teaching Service Attending  09/13/2011 1:44 PM

## 2011-09-13 NOTE — Progress Notes (Signed)
Daily Progress Note Family Medicine Teaching Service PGY-1   Patient name: Mackenzie [name]  Medical record number:[id number] Date of birth:September 19, 1927 Age: 78 y.o. Gender: female  LOS: 3 days   Subjective: Feeling better. No diarrhea, nausea or vomiting.  Objective: Vitals: Filed Vitals:   09/13/11 0649  BP: 167/70  Pulse: 89  Temp: 98.1 F (36.7 C)  Resp: 20   Physical Exam: Gen:  NAD HEENT: Moist mucous membranes CV: Regular rate and rhythm, II/VI systolic murmurs audible on precordium. No rubs or gallops PULM: Clear to auscultation bilaterally. No wheezes/rales/rhonchi ABD: Soft, non tender, non distended, normal bowel sounds EXT: No edema. Pedal pulses are diminished bilaterally.   Right Groin surgical incision with staples still present. Upper portion of incision with deshicence and new granular tissue present. Serous and greenish drainage present. Left heal with pressure ulcer present. Neuro: Alert and oriented to person, place and time(year/month). 1 recalled object ot of three and more than 10 seconds to answer it. No neurologic(motor or sensitive) focalization.   Labs and imaging:  CBC  Lab 09/12/11 1253 09/11/11 0600 09/10/11 0930  WBC 4.8 4.3 11.8*  HGB 8.6* 8.3* 9.9*  HCT 29.3* 28.1* 33.6*  PLT 282 286 334   CBG (last 3)   Basename 09/12/11 2225 09/12/11 1627 09/12/11 1129  GLUCAP 78 94 91   Medications: Scheduled Meds:    . donepezil  5 mg Oral QHS  . hydrochlorothiazide  25 mg Oral Daily  . insulin aspart  0-15 Units Subcutaneous TID WC  . insulin aspart  0-5 Units Subcutaneous QHS  . metoprolol  50 mg Oral BID  . mirtazapine  7.5 mg Oral QHS  . nystatin   Topical BID  . simvastatin  40 mg Oral QHS  . DISCONTD: enoxaparin  40 mg Subcutaneous Q24H  . DISCONTD: sertraline  50 mg Oral QHS   Continuous Infusions:    . DISCONTD: sodium chloride 110 mL/hr at 09/12/11 0715   PRN Meds:.alum & mag hydroxide-simeth, ondansetron (ZOFRAN) IV,  ondansetron, oxyCODONE Assessment and Plan:  78 yo female with h/o DM, HTN, PVD who was recently hospitalized on 08/29/11 and discharged on 03/05 for right groin infection from a recent right axillobifemoral bypass who presents with nausea, vomiting and diarrhea and admitted for IV hydration.   1. Diarrhea, nausea/vomiting and tachycardia: had resolved. c-diff was negative X 2 and stool cultures are negative.   - Norovirus pending - Fecal Hemoccult performed this morning pending results.  2. Right femoral wound: appears in the healing stages of an open wound. Normal WBC.  No fever. - Wound care and Vascular Surgery involved in her care. We appreciate their consult and will f/u recommendations.  3. Deconditioning: - PT/OT recommended SNF. SW contacted her SNF and they will like to take pt back once she is medically stable. - Mirtazepine started yesterday. Discontinued prior SSRI   4. DM:  Normal CBG's - hold home metformin - SSI  5.  HTN:   Isolated systolic hypertension.  - continue home medications: HCTZ 25mg  daily, metoprolol 50mg  bid.   6. Microcytic anemia: appears to be stable since 08/15/11. Was not anemic last year.  - Fecal Hemocult   -CBC monitoring  7. Dementia: Moderated memory loss.  - Continue home aricept 5mg   FEN/GI:  CHO modified diet. Saline lock IV Prophylaxis:  Lovenox. Will probably transition to SCDs Disposition: possible d/c to SNF today. Social: [facility] will [facility] patient back once is medically stable.  D. [place] Tommi Rumps  Criselda Peaches, MD PGY1, Family Medicine Teaching Service Pager [phone] 09/13/2011

## 2011-09-13 NOTE — Discharge Summary (Signed)
I interviewed and examined this patient and discussed the care plan with Dr. Aviva Signs and the [facility] team and agree with assessment and plan as documented in the discharge note for today.    Jamison Soward A. Sheffield Slider, MD Family Medicine Teaching Service Attending  09/13/2011 5:48 PM

## 2011-10-06 ENCOUNTER — Telehealth: Payer: Self-pay | Admitting: *Deleted

## 2011-10-06 ENCOUNTER — Encounter: Payer: Self-pay | Admitting: *Deleted

## 2011-10-06 NOTE — Telephone Encounter (Signed)
I have spoken to Rolly Pancake @ [facility]. I have advised her that since Ms.Carino has dementia, she MUST have her power of attorney present with her for her visit with [place] on 10/17/11 @ 1:15 pm in order for [place] to see her. Michelle verbalizes understanding.

## 2011-10-17 ENCOUNTER — Telehealth: Payer: Self-pay | Admitting: Internal Medicine

## 2011-10-17 ENCOUNTER — Ambulatory Visit: Payer: Medicare Other | Admitting: Internal Medicine

## 2011-10-17 NOTE — Telephone Encounter (Signed)
Dr Juanda Chance- Do you want to charge late cancellation fee?

## 2011-10-17 NOTE — Telephone Encounter (Signed)
Do not charge  

## 2011-10-26 ENCOUNTER — Encounter (HOSPITAL_COMMUNITY): Payer: Self-pay | Admitting: Emergency Medicine

## 2011-10-26 ENCOUNTER — Emergency Department (HOSPITAL_COMMUNITY): Payer: Medicare Other

## 2011-10-26 ENCOUNTER — Emergency Department (HOSPITAL_COMMUNITY)
Admission: EM | Admit: 2011-10-26 | Discharge: 2011-10-26 | Disposition: A | Discharge status: Home / Self Care | Payer: Medicare Other | Attending: Emergency Medicine | Admitting: Emergency Medicine

## 2011-10-26 DIAGNOSIS — Z23 Encounter for immunization: Secondary | ICD-10-CM | POA: Insufficient documentation

## 2011-10-26 DIAGNOSIS — S81009A Unspecified open wound, unspecified knee, initial encounter: Secondary | ICD-10-CM | POA: Insufficient documentation

## 2011-10-26 DIAGNOSIS — I2510 Atherosclerotic heart disease of native coronary artery without angina pectoris: Secondary | ICD-10-CM | POA: Insufficient documentation

## 2011-10-26 DIAGNOSIS — W268XXA Contact with other sharp object(s), not elsewhere classified, initial encounter: Secondary | ICD-10-CM | POA: Insufficient documentation

## 2011-10-26 DIAGNOSIS — IMO0002 Reserved for concepts with insufficient information to code with codable children: Secondary | ICD-10-CM

## 2011-10-26 DIAGNOSIS — S81809A Unspecified open wound, unspecified lower leg, initial encounter: Secondary | ICD-10-CM | POA: Insufficient documentation

## 2011-10-26 DIAGNOSIS — F329 Major depressive disorder, single episode, unspecified: Secondary | ICD-10-CM | POA: Insufficient documentation

## 2011-10-26 DIAGNOSIS — I10 Essential (primary) hypertension: Secondary | ICD-10-CM | POA: Insufficient documentation

## 2011-10-26 DIAGNOSIS — F411 Generalized anxiety disorder: Secondary | ICD-10-CM | POA: Insufficient documentation

## 2011-10-26 DIAGNOSIS — E119 Type 2 diabetes mellitus without complications: Secondary | ICD-10-CM | POA: Insufficient documentation

## 2011-10-26 DIAGNOSIS — F3289 Other specified depressive episodes: Secondary | ICD-10-CM | POA: Insufficient documentation

## 2011-10-26 MED ORDER — LIDOCAINE HCL 2 % IJ SOLN
10.0000 mL | Freq: Once | INTRAMUSCULAR | Status: AC
  Administered 2011-10-26: 200 mg via INTRADERMAL

## 2011-10-26 MED ORDER — ACETAMINOPHEN 325 MG PO TABS
650.0000 mg | ORAL_TABLET | Freq: Once | ORAL | Status: AC
  Administered 2011-10-26: 650 mg via ORAL
  Filled 2011-10-26: qty 2

## 2011-10-26 MED ORDER — OXYCODONE-ACETAMINOPHEN 5-325 MG PO TABS: 1.0000 | ORAL_TABLET | ORAL | Status: AC | PRN

## 2011-10-26 MED ORDER — TETANUS-DIPHTHERIA TOXOIDS TD 5-2 LFU IM INJ: 0.5000 mL | INJECTION | Freq: Once | INTRAMUSCULAR | Status: DC

## 2011-10-26 MED ORDER — TETANUS-DIPHTH-ACELL PERTUSSIS 5-2.5-18.5 LF-MCG/0.5 IM SUSP
0.5000 mL | Freq: Once | INTRAMUSCULAR | Status: AC
  Administered 2011-10-26: 0.5 mL via INTRAMUSCULAR
  Filled 2011-10-26: qty 0.5

## 2011-10-26 NOTE — ED Provider Notes (Signed)
History     CSN: [id number]  Arrival date & time 10/26/11  1328   First MD Initiated Contact with Patient 10/26/11 1330      Chief Complaint  Patient presents with  . Extremity Laceration    (Consider location/radiation/quality/duration/timing/severity/associated sxs/prior treatment) Patient is a 78 y.o. female presenting with skin laceration. The history is provided by the patient. No language interpreter was used.  Laceration  The incident occurred 1 to 2 hours ago. The laceration is located on the right leg. The laceration is 8 cm in size. The laceration mechanism was a a metal edge. The pain is at a severity of 0/10. The patient is experiencing no pain. She reports no foreign bodies present. Her tetanus status is unknown.  8cm laceration to RLE at nursing facility.  Was transferring her from w/c to bed.  Bleeding controlled. Unknown tetanus.  No defomity.  Past Medical History  Diagnosis Date  . Delirium 12/10/08    hospitalized  . Pelvic fracture 07/01/09    hospitalized  . Coronary artery disease   . Femoral neck fracture   . Anemia   . Hypertension   . Diabetes mellitus   . Dementia   . GI bleed   . Microcytic anemia   . Diverticulosis   . Compression fracture   . Gastric ulcer   . AVM (arteriovenous malformation)     of rectum  . Gastroparesis   . Hiatal hernia 1997  . Esophageal stricture 1997  . Anxiety   . Depression   . GERD (gastroesophageal reflux disease)   . Gout   . Psoriasis   . DJD (degenerative joint disease)   . Vascular disease     Past Surgical History  Procedure Date  . Cholecystectomy   . Cataract extraction   . Axillary-femoral bypass graft 08/15/2011    Procedure: BYPASS GRAFT AXILLA-BIFEMORAL;  Surgeon: [name], MD;  Location: [facility] OR;  Service: Vascular;  Laterality: Right;  Embolectomy of Right Femoral Artery with Right  Axilla Femoral Bypass Graft.  . Total hip arthroplasty     left  . Hemorrhoid surgery   . Incisional  hernia repair   . Aortobifemoral bypass graft 10/12/1992  . Common iliac thrombectomy 10/12/1992    with right profunda femoris embolectomy and left popliteal artery transfemoral embolectomy`    Family History  Problem Relation Age of Onset  . Heart failure Father     died age 78  . Stroke Mother     died age 11  . Heart attack Father   . Heart disease Sister   . Heart disease Brother   . Colon cancer Neg Hx   . Diabetes      History  Substance Use Topics  . Smoking status: Former Games developer  . Smokeless tobacco: Never Used  . Alcohol Use: No    OB History    Grav Para Term Preterm Abortions TAB SAB Ect Mult Living                  Review of Systems  Constitutional: Negative.  Negative for fever.  HENT: Negative.   Eyes: Negative.   Respiratory: Negative.   Cardiovascular: Negative.   Gastrointestinal: Negative.   Skin: Positive for wound.  Neurological: Negative.   Psychiatric/Behavioral: Negative.   All other systems reviewed and are negative.    Allergies  Review of patient's allergies indicates no known allergies.  Home Medications   Current Outpatient Rx  Name Route Sig Dispense Refill  .  ALUM & MAG HYDROXIDE-SIMETH 400-400-40 MG/5ML PO SUSP Oral Take 30 mLs by mouth every 6 (six) hours as needed. dyspepsia    . DONEPEZIL HCL 5 MG PO TABS Oral Take 1 tablet (5 mg total) by mouth at bedtime. 30 tablet 0  . ENOXAPARIN SODIUM 30 MG/0.3ML [place] SOLN Subcutaneous Inject 30 mg into the skin every 12 (twelve) hours. For 24 days    . ENOXAPARIN SODIUM 30 MG/0.3ML [place] SOLN Subcutaneous Inject 30 mg into the skin every 12 (twelve) hours.     Di Kindle SULFATE 325 (65 FE) MG PO TABS Oral Take 1 tablet (325 mg total) by mouth 3 (three) times daily with meals. 30 tablet 11  . HYDROCHLOROTHIAZIDE 25 MG PO TABS Oral Take 1 tablet (25 mg total) by mouth daily. 30 tablet 6  . INSULIN ASPART 100 UNIT/ML [place] SOLN Subcutaneous Inject 0-8 Units into the skin 3 (three) times daily  before meals.     Marland Kitchen METFORMIN HCL 1000 MG PO TABS Oral Take 1,000 mg by mouth daily.    Marland Kitchen METOPROLOL TARTRATE 50 MG PO TABS Oral Take 1 tablet (50 mg total) by mouth 2 (two) times daily. 60 tablet 5  . NITROGLYCERIN 0.3 MG/HR TD PT24 Transdermal Place 1 patch onto the skin daily.    . NYSTOP 100000 UNIT/GM EX POWD  Apply topically 2 (two) times daily. Apply to right groin area with dry dressing BID. 1 Bottle 0  . OVER THE COUNTER MEDICATION Oral Take 1 capsule by mouth 2 (two) times daily. Vitamin B complex with iron intrinsic factor    . OXYCODONE HCL 5 MG PO TABS Oral Take 1 tablet (5 mg total) by mouth every 4 (four) hours as needed. For severe pain 30 tablet 0  . SIMVASTATIN 40 MG PO TABS Oral Take 1 tablet (40 mg total) by mouth at bedtime. 30 tablet 5    BP 139/64  Pulse 76  Temp(Src) 97.8 F (36.6 C) (Oral)  Resp 16  SpO2 97%  Physical Exam  Nursing note and vitals reviewed. Constitutional: She is oriented to person, place, and time. She appears well-developed and well-nourished.  HENT:  Head: Normocephalic and atraumatic.  Eyes: Conjunctivae and EOM are normal. Pupils are equal, round, and reactive to light.  Neck: Normal range of motion. Neck supple.  Cardiovascular: Normal rate.   Pulmonary/Chest: Effort normal and breath sounds normal.  Abdominal: Soft.  Musculoskeletal: Normal range of motion. She exhibits no edema and no tenderness.  Neurological: She is alert and oriented to person, place, and time. She has normal reflexes.  Skin: Skin is warm and dry.       Laceration RLE  Good R pedal pulse. Decreased sensation due to diabetes.  Good rom below injury.  Psychiatric: She has a normal mood and affect.    ED Course  LACERATION REPAIR Date/Time: 10/26/2011 2:30 PM Performed by: Remi Haggard Authorized by: Remi Haggard Consent: Verbal consent obtained. Written consent not obtained. Risks and benefits: risks, benefits and alternatives were discussed Consent given  by: patient Patient understanding: patient states understanding of the procedure being performed Imaging studies: imaging studies available Patient identity confirmed: verbally with patient, arm band, provided demographic data and hospital-assigned identification number Time out: Immediately prior to procedure a "time out" was called to verify the correct patient, procedure, equipment, support staff and site/side marked as required. Body area: lower extremity Location details: right lower leg Laceration length: 8 cm Foreign bodies: no foreign bodies Tendon involvement: none Nerve  involvement: none Vascular damage: no Anesthesia: local infiltration Local anesthetic: lidocaine 2% with epinephrine Anesthetic total: 6 ml Patient sedated: no Preparation: Patient was prepped and draped in the usual sterile fashion. Irrigation solution: saline Irrigation method: jet lavage Amount of cleaning: standard Debridement: none Degree of undermining: none Skin closure: staples Number of sutures: 18 Approximation difficulty: simple Patient tolerance: Patient tolerated the procedure well with no immediate complications.   (including critical care time)  Labs Reviewed - No data to display No results found.   No diagnosis found.    MDM  LLE laceration repair.  Tetanus updated.  Return in 7-10 days for staple removal.        Remi Haggard, NP 10/26/11 1723

## 2011-10-26 NOTE — ED Notes (Signed)
Patient has a right lateral laceration right lateral lower extremity 6 inch x 1 inch no bleeding present at this time.  Laceration well approximated denies any pain at this time however requested tylenol pain 0/10.  Pedal pulse +1 bilateral.

## 2011-10-26 NOTE — Discharge Instructions (Signed)
Ms Bango we repaired your laceration today with staples.  We also updated your tetanus shot.  Return in 7-10 days for staple removal.  The x-ray film showed no fracture.  Keep the wound dry x 24 hours.  Return to the ER for any concerns.   Laceration Care, Adult A laceration is a cut or lesion that goes through all layers of the skin and into the tissue just beneath the skin. TREATMENT  Some lacerations may not require closure. Some lacerations may not be able to be closed due to an increased risk of infection. It is important to see your caregiver as soon as possible after an injury to minimize the risk of infection and maximize the opportunity for successful closure. If closure is appropriate, pain medicines may be given, if needed. The wound will be cleaned to help prevent infection. Your caregiver will use stitches (sutures), staples, wound glue (adhesive), or skin adhesive strips to repair the laceration. These tools bring the skin edges together to allow for faster healing and a better cosmetic outcome. However, all wounds will heal with a scar. Once the wound has healed, scarring can be minimized by covering the wound with sunscreen during the day for 1 full year. HOME CARE INSTRUCTIONS  For sutures or staples:  Keep the wound clean and dry.   If you were given a bandage (dressing), you should change it at least once a day. Also, change the dressing if it becomes wet or dirty, or as directed by your caregiver.   Wash the wound with soap and water 2 times a day. Rinse the wound off with water to remove all soap. Pat the wound dry with a clean towel.   After cleaning, apply a thin layer of the antibiotic ointment as recommended by your caregiver. This will help prevent infection and keep the dressing from sticking.   You may shower as usual after the first 24 hours. Do not soak the wound in water until the sutures are removed.   Only take over-the-counter or prescription medicines for pain,  discomfort, or fever as directed by your caregiver.   Get your sutures or staples removed as directed by your caregiver.  For skin adhesive strips:  Keep the wound clean and dry.   Do not get the skin adhesive strips wet. You may bathe carefully, using caution to keep the wound dry.   If the wound gets wet, pat it dry with a clean towel.   Skin adhesive strips will fall off on their own. You may trim the strips as the wound heals. Do not remove skin adhesive strips that are still stuck to the wound. They will fall off in time.  For wound adhesive:  You may briefly wet your wound in the shower or bath. Do not soak or scrub the wound. Do not swim. Avoid periods of heavy perspiration until the skin adhesive has fallen off on its own. After showering or bathing, gently pat the wound dry with a clean towel.   Do not apply liquid medicine, cream medicine, or ointment medicine to your wound while the skin adhesive is in place. This may loosen the film before your wound is healed.   If a dressing is placed over the wound, be careful not to apply tape directly over the skin adhesive. This may cause the adhesive to be pulled off before the wound is healed.   Avoid prolonged exposure to sunlight or tanning lamps while the skin adhesive is in place. Exposure  to ultraviolet light in the first year will darken the scar.   The skin adhesive will usually remain in place for 5 to 10 days, then naturally fall off the skin. Do not pick at the adhesive film.  You may need a tetanus shot if:  You cannot remember when you had your last tetanus shot.   You have never had a tetanus shot.  If you get a tetanus shot, your arm may swell, get red, and feel warm to the touch. This is common and not a problem. If you need a tetanus shot and you choose not to have one, there is a rare chance of getting tetanus. Sickness from tetanus can be serious. SEEK MEDICAL CARE IF:   You have redness, swelling, or increasing  pain in the wound.   You see a red line that goes away from the wound.   You have yellowish-white fluid (pus) coming from the wound.   You have a fever.   You notice a bad smell coming from the wound or dressing.   Your wound breaks open before or after sutures have been removed.   You notice something coming out of the wound such as wood or glass.   Your wound is on your hand or foot and you cannot move a finger or toe.  SEEK IMMEDIATE MEDICAL CARE IF:   Your pain is not controlled with prescribed medicine.   You have severe swelling around the wound causing pain and numbness or a change in color in your arm, hand, leg, or foot.   Your wound splits open and starts bleeding.   You have worsening numbness, weakness, or loss of function of any joint around or beyond the wound.   You develop painful lumps near the wound or on the skin anywhere on your body.  MAKE SURE YOU:   Understand these instructions.   Will watch your condition.   Will get help right away if you are not doing well or get worse.  Document Released: 06/19/2005 Document Revised: 06/08/2011 Document Reviewed: 12/13/2010 [facility] Patient Information 2012 [place], [place].Laceration Care, Adult A laceration is a cut or lesion that goes through all layers of the skin and into the tissue just beneath the skin. TREATMENT  Some lacerations may not require closure. Some lacerations may not be able to be closed due to an increased risk of infection. It is important to see your caregiver as soon as possible after an injury to minimize the risk of infection and maximize the opportunity for successful closure. If closure is appropriate, pain medicines may be given, if needed. The wound will be cleaned to help prevent infection. Your caregiver will use stitches (sutures), staples, wound glue (adhesive), or skin adhesive strips to repair the laceration. These tools bring the skin edges together to allow for faster healing and a  better cosmetic outcome. However, all wounds will heal with a scar. Once the wound has healed, scarring can be minimized by covering the wound with sunscreen during the day for 1 full year. HOME CARE INSTRUCTIONS  For sutures or staples:  Keep the wound clean and dry.   If you were given a bandage (dressing), you should change it at least once a day. Also, change the dressing if it becomes wet or dirty, or as directed by your caregiver.   Wash the wound with soap and water 2 times a day. Rinse the wound off with water to remove all soap. Pat the wound dry with a clean  towel.   After cleaning, apply a thin layer of the antibiotic ointment as recommended by your caregiver. This will help prevent infection and keep the dressing from sticking.   You may shower as usual after the first 24 hours. Do not soak the wound in water until the sutures are removed.   Only take over-the-counter or prescription medicines for pain, discomfort, or fever as directed by your caregiver.   Get your sutures or staples removed as directed by your caregiver.  For skin adhesive strips:  Keep the wound clean and dry.   Do not get the skin adhesive strips wet. You may bathe carefully, using caution to keep the wound dry.   If the wound gets wet, pat it dry with a clean towel.   Skin adhesive strips will fall off on their own. You may trim the strips as the wound heals. Do not remove skin adhesive strips that are still stuck to the wound. They will fall off in time.  For wound adhesive:  You may briefly wet your wound in the shower or bath. Do not soak or scrub the wound. Do not swim. Avoid periods of heavy perspiration until the skin adhesive has fallen off on its own. After showering or bathing, gently pat the wound dry with a clean towel.   Do not apply liquid medicine, cream medicine, or ointment medicine to your wound while the skin adhesive is in place. This may loosen the film before your wound is healed.     If a dressing is placed over the wound, be careful not to apply tape directly over the skin adhesive. This may cause the adhesive to be pulled off before the wound is healed.   Avoid prolonged exposure to sunlight or tanning lamps while the skin adhesive is in place. Exposure to ultraviolet light in the first year will darken the scar.   The skin adhesive will usually remain in place for 5 to 10 days, then naturally fall off the skin. Do not pick at the adhesive film.  You may need a tetanus shot if:  You cannot remember when you had your last tetanus shot.   You have never had a tetanus shot.  If you get a tetanus shot, your arm may swell, get red, and feel warm to the touch. This is common and not a problem. If you need a tetanus shot and you choose not to have one, there is a rare chance of getting tetanus. Sickness from tetanus can be serious. SEEK MEDICAL CARE IF:   You have redness, swelling, or increasing pain in the wound.   You see a red line that goes away from the wound.   You have yellowish-white fluid (pus) coming from the wound.   You have a fever.   You notice a bad smell coming from the wound or dressing.   Your wound breaks open before or after sutures have been removed.   You notice something coming out of the wound such as wood or glass.   Your wound is on your hand or foot and you cannot move a finger or toe.  SEEK IMMEDIATE MEDICAL CARE IF:   Your pain is not controlled with prescribed medicine.   You have severe swelling around the wound causing pain and numbness or a change in color in your arm, hand, leg, or foot.   Your wound splits open and starts bleeding.   You have worsening numbness, weakness, or loss of function of any joint  around or beyond the wound.   You develop painful lumps near the wound or on the skin anywhere on your body.  MAKE SURE YOU:   Understand these instructions.   Will watch your condition.   Will get help right away if  you are not doing well or get worse.  Document Released: 06/19/2005 Document Revised: 06/08/2011 Document Reviewed: 12/13/2010 [facility] Patient Information 2012 [place], [place].Laceration Care, Adult A laceration is a cut that goes through all layers of the skin. The cut goes into the tissue beneath the skin. HOME CARE For stitches (sutures) or staples:  Keep the cut clean and dry.   If you have a bandage (dressing), change it at least once a day. Change the bandage if it gets wet or dirty, or as told by your doctor.   Wash the cut with soap and water 2 times a day. Rinse the cut with water. Pat it dry with a clean towel.   Put a thin layer of medicated cream on the cut as told by your doctor.   You may shower after the first 24 hours. Do not soak the cut in water until the stitches are removed.   Only take medicines as told by your doctor.   Have your stitches or staples removed as told by your doctor.  For skin adhesive strips:  Keep the cut clean and dry.   Do not get the strips wet. You may take a bath, but be careful to keep the cut dry.   If the cut gets wet, pat it dry with a clean towel.   The strips will fall off on their own. Do not remove the strips that are still stuck to the cut.  For wound glue:  You may shower or take baths. Do not soak or scrub the cut. Do not swim. Avoid heavy sweating until the glue falls off on its own. After a shower or bath, pat the cut dry with a clean towel.   Do not put medicine on your cut until the glue falls off.   If you have a bandage, do not put tape over the glue.   Avoid lots of sunlight or tanning lamps until the glue falls off. Put sunscreen on the cut for the first year to reduce your scar.   The glue will fall off on its own. Do not pick at the glue.  You may need a tetanus shot if:  You cannot remember when you had your last tetanus shot.   You have never had a tetanus shot.  If you need a tetanus shot and you choose  not to have one, you may get tetanus. Sickness from tetanus can be serious. GET HELP RIGHT AWAY IF:   Your pain does not get better with medicine.   Your arm, hand, leg, or foot loses feeling (numbness) or changes color.   Your cut is bleeding.   Your joint feels weak, or you cannot use your joint.   You have painful lumps on your body.   Your cut is red, puffy (swollen), or painful.   You have a red line on the skin near the cut.   You have yellowish-white fluid (pus) coming from the cut.   You have a fever.   You have a bad smell coming from the cut or bandage.   Your cut breaks open before or after stitches are removed.   You notice something coming out of the cut, such as wood or glass.  You cannot move a finger or toe.  MAKE SURE YOU:   Understand these instructions.   Will watch your condition.   Will get help right away if you are not doing well or get worse.  Document Released: 12/06/2007 Document Revised: 06/08/2011 Document Reviewed: 12/13/2010 [facility] Patient Information 2012 [place], [place].Laceration Care, Adult A laceration is a cut that goes through all layers of the skin. The cut goes into the tissue beneath the skin. HOME CARE For stitches (sutures) or staples:  Keep the cut clean and dry.   If you have a bandage (dressing), change it at least once a day. Change the bandage if it gets wet or dirty, or as told by your doctor.   Wash the cut with soap and water 2 times a day. Rinse the cut with water. Pat it dry with a clean towel.   Put a thin layer of medicated cream on the cut as told by your doctor.   You may shower after the first 24 hours. Do not soak the cut in water until the stitches are removed.   Only take medicines as told by your doctor.   Have your stitches or staples removed as told by your doctor.  For skin adhesive strips:  Keep the cut clean and dry.   Do not get the strips wet. You may take a bath, but be careful to keep  the cut dry.   If the cut gets wet, pat it dry with a clean towel.   The strips will fall off on their own. Do not remove the strips that are still stuck to the cut.  For wound glue:  You may shower or take baths. Do not soak or scrub the cut. Do not swim. Avoid heavy sweating until the glue falls off on its own. After a shower or bath, pat the cut dry with a clean towel.   Do not put medicine on your cut until the glue falls off.   If you have a bandage, do not put tape over the glue.   Avoid lots of sunlight or tanning lamps until the glue falls off. Put sunscreen on the cut for the first year to reduce your scar.   The glue will fall off on its own. Do not pick at the glue.  You may need a tetanus shot if:  You cannot remember when you had your last tetanus shot.   You have never had a tetanus shot.  If you need a tetanus shot and you choose not to have one, you may get tetanus. Sickness from tetanus can be serious. GET HELP RIGHT AWAY IF:   Your pain does not get better with medicine.   Your arm, hand, leg, or foot loses feeling (numbness) or changes color.   Your cut is bleeding.   Your joint feels weak, or you cannot use your joint.   You have painful lumps on your body.   Your cut is red, puffy (swollen), or painful.   You have a red line on the skin near the cut.   You have yellowish-white fluid (pus) coming from the cut.   You have a fever.   You have a bad smell coming from the cut or bandage.   Your cut breaks open before or after stitches are removed.   You notice something coming out of the cut, such as wood or glass.   You cannot move a finger or toe.  MAKE SURE YOU:   Understand these  instructions.   Will watch your condition.   Will get help right away if you are not doing well or get worse.  Document Released: 12/06/2007 Document Revised: 06/08/2011 Document Reviewed: 12/13/2010 [facility] Patient Information 2012 [place], [place].

## 2011-10-26 NOTE — ED Notes (Signed)
[facility] arrived.  

## 2011-10-26 NOTE — ED Notes (Signed)
Patient from nursing home transferred from wheelchair to stretcher laceration 6 inches per EMS bandaged prior to arrival bleeding controlled denies pain ax4

## 2011-10-31 NOTE — ED Provider Notes (Signed)
Medical screening examination/treatment/procedure(s) were conducted as a shared visit with non-physician practitioner(s) and myself.  I personally evaluated the patient during the encounter.  78 year old female with laceration to her leg. Bleeding is currently controlled. Doubt underlying fracture or foreign body. Neurovascularly intact distally. Laceration closure by Remi Haggard, PA.  Raeford Razor, MD 10/31/11 1248

## 2011-11-20 ENCOUNTER — Inpatient Hospital Stay (HOSPITAL_COMMUNITY)
Admission: EM | Admit: 2011-11-20 | Discharge: 2011-11-24 | DRG: 300 | Disposition: A | Discharge status: Skilled Nursing Facility | Payer: Medicare Other | Attending: Emergency Medicine | Admitting: Emergency Medicine

## 2011-11-20 ENCOUNTER — Encounter (HOSPITAL_COMMUNITY): Payer: Self-pay | Admitting: Emergency Medicine

## 2011-11-20 DIAGNOSIS — E1159 Type 2 diabetes mellitus with other circulatory complications: Principal | ICD-10-CM | POA: Diagnosis present

## 2011-11-20 DIAGNOSIS — M810 Age-related osteoporosis without current pathological fracture: Secondary | ICD-10-CM | POA: Diagnosis present

## 2011-11-20 DIAGNOSIS — Z96649 Presence of unspecified artificial hip joint: Secondary | ICD-10-CM

## 2011-11-20 DIAGNOSIS — E119 Type 2 diabetes mellitus without complications: Secondary | ICD-10-CM | POA: Diagnosis present

## 2011-11-20 DIAGNOSIS — E785 Hyperlipidemia, unspecified: Secondary | ICD-10-CM | POA: Diagnosis present

## 2011-11-20 DIAGNOSIS — I739 Peripheral vascular disease, unspecified: Secondary | ICD-10-CM | POA: Diagnosis present

## 2011-11-20 DIAGNOSIS — L0390 Cellulitis, unspecified: Secondary | ICD-10-CM

## 2011-11-20 DIAGNOSIS — I10 Essential (primary) hypertension: Secondary | ICD-10-CM | POA: Diagnosis present

## 2011-11-20 DIAGNOSIS — F3289 Other specified depressive episodes: Secondary | ICD-10-CM | POA: Diagnosis present

## 2011-11-20 DIAGNOSIS — I96 Gangrene, not elsewhere classified: Secondary | ICD-10-CM | POA: Diagnosis present

## 2011-11-20 DIAGNOSIS — I998 Other disorder of circulatory system: Secondary | ICD-10-CM

## 2011-11-20 DIAGNOSIS — S80811A Abrasion, right lower leg, initial encounter: Secondary | ICD-10-CM | POA: Diagnosis present

## 2011-11-20 DIAGNOSIS — E876 Hypokalemia: Secondary | ICD-10-CM | POA: Diagnosis present

## 2011-11-20 DIAGNOSIS — F0390 Unspecified dementia without behavioral disturbance: Secondary | ICD-10-CM | POA: Diagnosis present

## 2011-11-20 DIAGNOSIS — L02619 Cutaneous abscess of unspecified foot: Secondary | ICD-10-CM | POA: Diagnosis present

## 2011-11-20 DIAGNOSIS — K3184 Gastroparesis: Secondary | ICD-10-CM | POA: Diagnosis present

## 2011-11-20 DIAGNOSIS — H3530 Unspecified macular degeneration: Secondary | ICD-10-CM | POA: Diagnosis present

## 2011-11-20 DIAGNOSIS — F329 Major depressive disorder, single episode, unspecified: Secondary | ICD-10-CM | POA: Diagnosis present

## 2011-11-20 DIAGNOSIS — M109 Gout, unspecified: Secondary | ICD-10-CM | POA: Diagnosis present

## 2011-11-20 DIAGNOSIS — I2510 Atherosclerotic heart disease of native coronary artery without angina pectoris: Secondary | ICD-10-CM | POA: Diagnosis present

## 2011-11-20 DIAGNOSIS — D649 Anemia, unspecified: Secondary | ICD-10-CM | POA: Diagnosis present

## 2011-11-20 LAB — DIFFERENTIAL
Basophils Absolute: 0.1 10*3/uL (ref 0.0–0.1)
Eosinophils Relative: 0 % (ref 0–5)
Lymphocytes Relative: 13 % (ref 12–46)
Lymphs Abs: 1.6 10*3/uL (ref 0.7–4.0)
Neutro Abs: 9.1 10*3/uL — ABNORMAL HIGH (ref 1.7–7.7)
Neutrophils Relative %: 79 % — ABNORMAL HIGH (ref 43–77)

## 2011-11-20 LAB — CBC
MCV: 74.9 fL — ABNORMAL LOW (ref 78.0–100.0)
Platelets: 518 10*3/uL — ABNORMAL HIGH (ref 150–400)
RBC: 5.1 MIL/uL (ref 3.87–5.11)
RDW: 19.4 % — ABNORMAL HIGH (ref 11.5–15.5)
WBC: 11.6 10*3/uL — ABNORMAL HIGH (ref 4.0–10.5)

## 2011-11-20 LAB — BASIC METABOLIC PANEL
CO2: 28 mEq/L (ref 19–32)
Calcium: 9.3 mg/dL (ref 8.4–10.5)
Chloride: 93 mEq/L — ABNORMAL LOW (ref 96–112)
Glucose, Bld: 106 mg/dL — ABNORMAL HIGH (ref 70–99)
Potassium: 3.9 mEq/L (ref 3.5–5.1)
Sodium: 133 mEq/L — ABNORMAL LOW (ref 135–145)

## 2011-11-20 MED ORDER — CLINDAMYCIN PHOSPHATE 600 MG/50ML IV SOLN
600.0000 mg | Freq: Once | INTRAVENOUS | Status: AC
Start: 2011-11-20 — End: 2011-11-20
  Administered 2011-11-20: 600 mg via INTRAVENOUS
  Filled 2011-11-20: qty 50

## 2011-11-20 NOTE — ED Provider Notes (Addendum)
History     CSN: [id number]  Arrival date & time 11/20/11  1543   First MD Initiated Contact with Patient 11/20/11 1637      Chief Complaint  Patient presents with  . Wound Infection  . Foot Pain    (Consider location/radiation/quality/duration/timing/severity/associated sxs/prior treatment) HPI Comments: Pt brought in from assisted living with complaints of leg pain and increased redness to left foot.  She has a hx of PVD s/p revasculariztion to lower legs in February.  Daughter says that over last several days, pt has complained of increased pain to left leg and worsening wound to left heel.  Daughter concerned that pt may need an amputation.  No fevers.  Minimal drainage from wound.  Pt taking pain meds at [facility] without relief  The history is provided by a relative.    Past Medical History  Diagnosis Date  . Delirium 12/10/08    hospitalized  . Pelvic fracture 07/01/09    hospitalized  . Coronary artery disease   . Femoral neck fracture   . Anemia   . Hypertension   . Diabetes mellitus   . Dementia   . GI bleed   . Microcytic anemia   . Diverticulosis   . Compression fracture   . Gastric ulcer   . AVM (arteriovenous malformation)     of rectum  . Gastroparesis   . Hiatal hernia 1997  . Esophageal stricture 1997  . Anxiety   . Depression   . GERD (gastroesophageal reflux disease)   . Gout   . Psoriasis   . DJD (degenerative joint disease)   . Vascular disease     Past Surgical History  Procedure Date  . Cholecystectomy   . Cataract extraction   . Axillary-femoral bypass graft 08/15/2011    Procedure: BYPASS GRAFT AXILLA-BIFEMORAL;  Surgeon: [name], MD;  Location: [facility] OR;  Service: Vascular;  Laterality: Right;  Embolectomy of Right Femoral Artery with Right  Axilla Femoral Bypass Graft.  . Total hip arthroplasty     left  . Hemorrhoid surgery   . Incisional hernia repair   . Aortobifemoral bypass graft 10/12/1992  . Common iliac thrombectomy  10/12/1992    with right profunda femoris embolectomy and left popliteal artery transfemoral embolectomy`    Family History  Problem Relation Age of Onset  . Heart failure Father     died age 34  . Stroke Mother     died age 35  . Heart attack Father   . Heart disease Sister   . Heart disease Brother   . Colon cancer Neg Hx   . Diabetes      History  Substance Use Topics  . Smoking status: Former Games developer  . Smokeless tobacco: Never Used  . Alcohol Use: No    OB History    Grav Para Term Preterm Abortions TAB SAB Ect Mult Living                  Review of Systems  Unable to perform ROS: Dementia    Allergies  Review of patient's allergies indicates no known allergies.  Home Medications   Current Outpatient Rx  Name Route Sig Dispense Refill  . ALBUTEROL SULFATE (2.5 MG/3ML) 0.083% IN NEBU Nebulization Take 2.5 mg by nebulization every 6 (six) hours as needed. For wheezing/shortness of breath.    Marland Kitchen ALUM & MAG HYDROXIDE-SIMETH 400-400-40 MG/5ML PO SUSP Oral Take 30 mLs by mouth every 6 (six) hours as needed. dyspepsia    .  DONEPEZIL HCL 10 MG PO TABS Oral Take 10 mg by mouth at bedtime.    Marland Kitchen PRO-STAT 64 PO LIQD Oral Take 30 mLs by mouth 2 (two) times daily.    Marland Kitchen FERROUS SULFATE 325 (65 FE) MG PO TABS Oral Take 325 mg by mouth 3 (three) times daily with meals.    Marland Kitchen HYDROCHLOROTHIAZIDE 25 MG PO TABS Oral Take 25 mg by mouth daily.    . INSULIN ASPART 100 UNIT/ML [place] SOLN Subcutaneous Inject 0-8 Units into the skin 4 (four) times daily -  before meals and at bedtime. Per sliding scale    . METFORMIN HCL 1000 MG PO TABS Oral Take 1,000 mg by mouth daily.    Marland Kitchen METOPROLOL TARTRATE 50 MG PO TABS Oral Take 50 mg by mouth 2 (two) times daily.    Marland Kitchen MIRTAZAPINE 15 MG PO TABS Oral Take 15 mg by mouth at bedtime.    . CERTAVITE/ANTIOXIDANTS PO Oral Take 1 tablet by mouth daily.    Marland Kitchen NITROGLYCERIN 0.3 MG/HR TD PT24 Transdermal Place 1 patch onto the skin daily. Apply in morning,  remove at bedtime.    . OXYCODONE HCL 5 MG PO TABS Oral Take 5 mg by mouth every 4 (four) hours as needed. For pain.    Marland Kitchen POTASSIUM CHLORIDE CRYS ER 20 MEQ PO TBCR Oral Take 20 mEq by mouth daily.    Marland Kitchen PROBIOTIC PO Oral Take 1 capsule by mouth 2 (two) times daily. 14 day course of therapy; started 11/13/11    . SIMVASTATIN 40 MG PO TABS Oral Take 40 mg by mouth at bedtime.    . TRAMADOL HCL 50 MG PO TABS Oral Take 50 mg by mouth every 6 (six) hours.    . VENLAFAXINE HCL ER 75 MG PO CP24 Oral Take 75 mg by mouth daily.    Marland Kitchen VITAMIN C 500 MG PO TABS Oral Take 500 mg by mouth 2 (two) times daily.    Marland Kitchen NITROFURANTOIN MONOHYD MACRO 100 MG PO CAPS Oral Take 100 mg by mouth 2 (two) times daily. 7 day course of therapy; started 11/13/11      BP 133/66  Pulse 95  Temp(Src) 97.8 F (36.6 C) (Oral)  Resp 20  SpO2 98%  Physical Exam  Constitutional: She appears well-developed and well-nourished.  HENT:  Head: Normocephalic and atraumatic.  Eyes: Pupils are equal, round, and reactive to light.  Neck: Normal range of motion. Neck supple.  Cardiovascular: Normal rate, regular rhythm and normal heart sounds.   Pulmonary/Chest: Effort normal and breath sounds normal. No respiratory distress. She has no wheezes. She has no rales. She exhibits no tenderness.  Abdominal: Soft. Bowel sounds are normal. There is no tenderness. There is no rebound and no guarding.  Musculoskeletal: Normal range of motion. She exhibits no edema.       Dusky color to left last three toes of foot.  Blackened wound to left heel with some erythema.  No palpable pulses in foot.  CRT diminished.    Lymphadenopathy:    She has no cervical adenopathy.  Neurological: She is alert.  Skin: Skin is warm and dry. No rash noted.  Psychiatric: She has a normal mood and affect.    ED Course  Procedures (including critical care time)  Results for orders placed during the hospital encounter of 11/20/11  CBC      Component Value Range     WBC 11.6 (*) 4.0 - 10.5 (K/uL)   RBC 5.10  3.87 - 5.11 (MIL/uL)   Hemoglobin 12.1  12.0 - 15.0 (g/dL)   HCT 49.7  02.6 - 37.8 (%)   MCV 74.9 (*) 78.0 - 100.0 (fL)   MCH 23.7 (*) 26.0 - 34.0 (pg)   MCHC 31.7  30.0 - 36.0 (g/dL)   RDW 58.8 (*) 50.2 - 15.5 (%)   Platelets 518 (*) 150 - 400 (K/uL)  DIFFERENTIAL      Component Value Range   Neutrophils Relative 79 (*) 43 - 77 (%)   Neutro Abs 9.1 (*) 1.7 - 7.7 (K/uL)   Lymphocytes Relative 13  12 - 46 (%)   Lymphs Abs 1.6  0.7 - 4.0 (K/uL)   Monocytes Relative 7  3 - 12 (%)   Monocytes Absolute 0.9  0.1 - 1.0 (K/uL)   Eosinophils Relative 0  0 - 5 (%)   Eosinophils Absolute 0.0  0.0 - 0.7 (K/uL)   Basophils Relative 0  0 - 1 (%)   Basophils Absolute 0.1  0.0 - 0.1 (K/uL)  BASIC METABOLIC PANEL      Component Value Range   Sodium 133 (*) 135 - 145 (mEq/L)   Potassium 3.9  3.5 - 5.1 (mEq/L)   Chloride 93 (*) 96 - 112 (mEq/L)   CO2 28  19 - 32 (mEq/L)   Glucose, Bld 106 (*) 70 - 99 (mg/dL)   BUN 19  6 - 23 (mg/dL)   Creatinine, Ser 7.74 (*) 0.50 - 1.10 (mg/dL)   Calcium 9.3  8.4 - 12.8 (mg/dL)   GFR calc non Af Amer >90  >90 (mL/min)   GFR calc Af Amer >90  >90 (mL/min)     No results found.   1. Cellulitis   2. Ischemic leg       MDM  Spoke with Dr. Arbie Cookey with vascular surgery who will see pt in the am.  Requests pt to be transferred to [facility] for admission.  Spoke with FP teaching service who has accepted pt.  Started on abx.        Rolan Bucco, MD 11/20/11 7867  Rolan Bucco, MD 01/08/12 1520

## 2011-11-20 NOTE — ED Notes (Signed)
Per EMS, pt from [facility] assisted living.  Pt with wound to left heel and redness to left foot.  Pt with h/o PVD.

## 2011-11-20 NOTE — ED Notes (Signed)
Bed:WA01<BR> Expected date:<BR> Expected time:<BR> Means of arrival:<BR> Comments:<BR> EMS

## 2011-11-20 NOTE — ED Notes (Signed)
Niece Mackenzie [name] [phone]; [phone]

## 2011-11-21 ENCOUNTER — Inpatient Hospital Stay (HOSPITAL_COMMUNITY): Payer: Medicare Other

## 2011-11-21 ENCOUNTER — Encounter (HOSPITAL_COMMUNITY): Payer: Self-pay | Admitting: Family Medicine

## 2011-11-21 DIAGNOSIS — I70269 Atherosclerosis of native arteries of extremities with gangrene, unspecified extremity: Secondary | ICD-10-CM

## 2011-11-21 DIAGNOSIS — L02419 Cutaneous abscess of limb, unspecified: Secondary | ICD-10-CM

## 2011-11-21 DIAGNOSIS — E1149 Type 2 diabetes mellitus with other diabetic neurological complication: Secondary | ICD-10-CM

## 2011-11-21 DIAGNOSIS — L03119 Cellulitis of unspecified part of limb: Secondary | ICD-10-CM

## 2011-11-21 LAB — COMPREHENSIVE METABOLIC PANEL
AST: 18 U/L (ref 0–37)
Albumin: 2.1 g/dL — ABNORMAL LOW (ref 3.5–5.2)
BUN: 16 mg/dL (ref 6–23)
Calcium: 9.4 mg/dL (ref 8.4–10.5)
Creatinine, Ser: 0.44 mg/dL — ABNORMAL LOW (ref 0.50–1.10)
Total Bilirubin: 0.2 mg/dL — ABNORMAL LOW (ref 0.3–1.2)
Total Protein: 6.3 g/dL (ref 6.0–8.3)

## 2011-11-21 LAB — GLUCOSE, CAPILLARY: Glucose-Capillary: 83 mg/dL (ref 70–99)

## 2011-11-21 LAB — CBC
HCT: 36.6 % (ref 36.0–46.0)
Hemoglobin: 12 g/dL (ref 12.0–15.0)
Hemoglobin: 11.5 g/dL — ABNORMAL LOW (ref 12.0–15.0)
MCH: 23.4 pg — ABNORMAL LOW (ref 26.0–34.0)
MCH: 23.5 pg — ABNORMAL LOW (ref 26.0–34.0)
MCV: 74.4 fL — ABNORMAL LOW (ref 78.0–100.0)
MCV: 74.8 fL — ABNORMAL LOW (ref 78.0–100.0)
Platelets: 436 10*3/uL — ABNORMAL HIGH (ref 150–400)
Platelets: 420 10*3/uL — ABNORMAL HIGH (ref 150–400)
RBC: 5.12 MIL/uL — ABNORMAL HIGH (ref 3.87–5.11)
RBC: 4.89 MIL/uL (ref 3.87–5.11)
WBC: 10.8 10*3/uL — ABNORMAL HIGH (ref 4.0–10.5)
WBC: 10.1 10*3/uL (ref 4.0–10.5)

## 2011-11-21 LAB — CREATININE, SERUM: GFR calc Af Amer: >90 mL/min (ref >90)

## 2011-11-21 MED ORDER — POTASSIUM CHLORIDE CRYS ER 20 MEQ PO TBCR
20.0000 meq | EXTENDED_RELEASE_TABLET | Freq: Every day | ORAL | Status: DC
  Administered 2011-11-21 – 2011-11-24 (×4): 20 meq via ORAL
  Filled 2011-11-21 (×4): qty 1

## 2011-11-21 MED ORDER — HYDROCHLOROTHIAZIDE 25 MG PO TABS
25.0000 mg | ORAL_TABLET | Freq: Every day | ORAL | Status: DC
  Administered 2011-11-21 – 2011-11-24 (×4): 25 mg via ORAL
  Filled 2011-11-21 (×4): qty 1

## 2011-11-21 MED ORDER — CIPROFLOXACIN IN D5W 400 MG/200ML IV SOLN
400.0000 mg | Freq: Two times a day (BID) | INTRAVENOUS | Status: DC
  Administered 2011-11-21 – 2011-11-22 (×2): 400 mg via INTRAVENOUS
  Filled 2011-11-21 (×3): qty 200

## 2011-11-21 MED ORDER — HYDROMORPHONE HCL PF 1 MG/ML IJ SOLN
0.5000 mg | INTRAMUSCULAR | Status: DC | PRN
  Administered 2011-11-21: 1 mg via INTRAVENOUS
  Administered 2011-11-21: 0.5 mg via INTRAVENOUS
  Filled 2011-11-21 (×2): qty 1

## 2011-11-21 MED ORDER — PIPERACILLIN-TAZOBACTAM 3.375 G IVPB
3.3750 g | Freq: Three times a day (TID) | INTRAVENOUS | Status: DC
  Administered 2011-11-21: 3.375 g via INTRAVENOUS
  Filled 2011-11-21 (×3): qty 50

## 2011-11-21 MED ORDER — VANCOMYCIN HCL 1000 MG IV SOLR
1250.0000 mg | Freq: Every day | INTRAVENOUS | Status: DC
  Administered 2011-11-21: 1250 mg via INTRAVENOUS
  Filled 2011-11-21 (×2): qty 1250

## 2011-11-21 MED ORDER — ALBUTEROL SULFATE (5 MG/ML) 0.5% IN NEBU: 2.5000 mg | INHALATION_SOLUTION | RESPIRATORY_TRACT | Status: DC | PRN

## 2011-11-21 MED ORDER — ALUM & MAG HYDROXIDE-SIMETH 400-400-40 MG/5ML PO SUSP: 30.0000 mL | Freq: Four times a day (QID) | ORAL | Status: DC | PRN

## 2011-11-21 MED ORDER — FERROUS SULFATE 325 (65 FE) MG PO TABS
325.0000 mg | ORAL_TABLET | Freq: Three times a day (TID) | ORAL | Status: DC
  Administered 2011-11-21 – 2011-11-24 (×11): 325 mg via ORAL
  Filled 2011-11-21 (×13): qty 1

## 2011-11-21 MED ORDER — VENLAFAXINE HCL ER 75 MG PO CP24
75.0000 mg | ORAL_CAPSULE | Freq: Every day | ORAL | Status: DC
  Administered 2011-11-21 – 2011-11-24 (×4): 75 mg via ORAL
  Filled 2011-11-21 (×4): qty 1

## 2011-11-21 MED ORDER — DONEPEZIL HCL 10 MG PO TABS
10.0000 mg | ORAL_TABLET | Freq: Every day | ORAL | Status: DC
  Administered 2011-11-21 – 2011-11-23 (×3): 10 mg via ORAL
  Filled 2011-11-21 (×4): qty 1

## 2011-11-21 MED ORDER — PRO-STAT SUGAR FREE PO LIQD
30.0000 mL | Freq: Two times a day (BID) | ORAL | Status: DC
  Administered 2011-11-21 – 2011-11-24 (×6): 30 mL via ORAL
  Filled 2011-11-21 (×8): qty 30

## 2011-11-21 MED ORDER — SODIUM CHLORIDE 0.9 % IV SOLN
INTRAVENOUS | Status: DC
  Administered 2011-11-21: via INTRAVENOUS
  Administered 2011-11-21: 1000 mL via INTRAVENOUS
  Administered 2011-11-22 – 2011-11-23 (×2): via INTRAVENOUS

## 2011-11-21 MED ORDER — HEPARIN SODIUM (PORCINE) 5000 UNIT/ML IJ SOLN
5000.0000 [IU] | Freq: Three times a day (TID) | INTRAMUSCULAR | Status: DC
  Administered 2011-11-21 – 2011-11-22 (×4): 5000 [IU] via SUBCUTANEOUS
  Filled 2011-11-21 (×7): qty 1

## 2011-11-21 MED ORDER — SIMVASTATIN 40 MG PO TABS
40.0000 mg | ORAL_TABLET | Freq: Every day | ORAL | Status: DC
  Administered 2011-11-21 – 2011-11-23 (×3): 40 mg via ORAL
  Filled 2011-11-21 (×4): qty 1

## 2011-11-21 MED ORDER — TRAMADOL HCL 50 MG PO TABS
50.0000 mg | ORAL_TABLET | Freq: Four times a day (QID) | ORAL | Status: DC | PRN
  Administered 2011-11-21 – 2011-11-24 (×6): 50 mg via ORAL
  Filled 2011-11-21 (×7): qty 1

## 2011-11-21 MED ORDER — MIRTAZAPINE 15 MG PO TABS
15.0000 mg | ORAL_TABLET | Freq: Every day | ORAL | Status: DC
  Administered 2011-11-21 – 2011-11-23 (×3): 15 mg via ORAL
  Filled 2011-11-21 (×4): qty 1

## 2011-11-21 MED ORDER — METOPROLOL TARTRATE 50 MG PO TABS
50.0000 mg | ORAL_TABLET | Freq: Two times a day (BID) | ORAL | Status: DC
  Administered 2011-11-21 – 2011-11-24 (×7): 50 mg via ORAL
  Filled 2011-11-21 (×8): qty 1

## 2011-11-21 MED ORDER — OXYCODONE HCL 5 MG PO TABS
5.0000 mg | ORAL_TABLET | ORAL | Status: DC | PRN
  Administered 2011-11-21 – 2011-11-24 (×10): 5 mg via ORAL
  Filled 2011-11-21 (×10): qty 1

## 2011-11-21 MED ORDER — IOHEXOL 350 MG/ML SOLN
100.0000 mL | Freq: Once | INTRAVENOUS | Status: AC | PRN
  Administered 2011-11-21: 100 mL via INTRAVENOUS

## 2011-11-21 MED ORDER — PIPERACILLIN-TAZOBACTAM 3.375 G IVPB
3.3750 g | Freq: Once | INTRAVENOUS | Status: AC
  Administered 2011-11-21: 3.375 g via INTRAVENOUS
  Filled 2011-11-21: qty 50

## 2011-11-21 MED ORDER — INSULIN ASPART 100 UNIT/ML ~~LOC~~ SOLN
0.0000 [IU] | SUBCUTANEOUS | Status: DC
  Administered 2011-11-22: 2 [IU] via SUBCUTANEOUS

## 2011-11-21 MED ORDER — DOXYCYCLINE HYCLATE 100 MG IV SOLR
100.0000 mg | Freq: Two times a day (BID) | INTRAVENOUS | Status: DC
  Administered 2011-11-22: 100 mg via INTRAVENOUS
  Filled 2011-11-21 (×3): qty 100

## 2011-11-21 MED ORDER — VITAMIN C 500 MG PO TABS
500.0000 mg | ORAL_TABLET | Freq: Two times a day (BID) | ORAL | Status: DC
  Administered 2011-11-21 – 2011-11-24 (×7): 500 mg via ORAL
  Filled 2011-11-21 (×8): qty 1

## 2011-11-21 MED ORDER — MUPIROCIN CALCIUM 2 % EX CREA
TOPICAL_CREAM | Freq: Every day | CUTANEOUS | Status: DC
  Administered 2011-11-22 – 2011-11-24 (×3): via TOPICAL
  Filled 2011-11-21 (×2): qty 15

## 2011-11-21 MED ORDER — ALUM & MAG HYDROXIDE-SIMETH 200-200-20 MG/5ML PO SUSP: 30.0000 mL | Freq: Four times a day (QID) | ORAL | Status: DC | PRN

## 2011-11-21 NOTE — Consult Note (Signed)
WOC consult Note Reason for Consult: Consult requested for leg and heel wounds prior to VVS involvement.  Dr Myra Gianotti now following for assessment and plan of care. Wound type: Left heel unstageable pressure ulcer present on admission, 3X5cm.  100% dry eschar.  Left toes with patchy necrotic areas.  No topical treatment needed to these sites. Pressure Ulcer POA: Yes  Measurement: Right calf with 2 full thickness abrasions.  Pt unable to state how this occurred.  Crescent shaped laceration 8 cm in length, approx .1cm wide and .2cm depth. Second laceration 4X.5X.2cm  Both sites 80% yellow, 20% red, small yellow drainage, no odor. Dressing procedure/placement/frequency: Bactroban to provide antimicrobial benefits and promote healing. Please refer to VVS for further plan of care. Will not plan to follow further unless re-consulted.  [address] RN, MSN, [employer]  [phone]

## 2011-11-21 NOTE — Clinical Social Work Psychosocial (Addendum)
    Clinical Social Work Department BRIEF PSYCHOSOCIAL ASSESSMENT 11/21/2011  Patient:  Mackenzie [name], Mackenzie [name]     Account Number:  [id number]     Admit date:  11/20/2011  Clinical Social Worker:  Lourdes Sledge  Date/Time:  11/21/2011 09:48 AM  Referred by:  CSW  Date Referred:  11/21/2011 Referred for  SNF Placement  SNF Placement   Other Referral:   Interview type:  Other - See comment Other interview type:   Pt presents with dementia. CSW contacted pt emergency contact Mackenzie [name] who informed CSW that she is no longe pt caregiver, would like her informatin removed from the face sheet however pt is a resident of [facility].    PSYCHOSOCIAL DATA Living Status:  FACILITY Admitted from facility:  [facility] Level of care:  Skilled Nursing Facility Primary support name:  Mackenzie [name] Primary support relationship to patient:  FAMILY Degree of support available:   Pt niece is Mackenzie [name] however CSW was unable to confirm the amount of support pt receives from family.    CURRENT CONCERNS Current Concerns  Post-Acute Placement   Other Concerns:    SOCIAL WORK ASSESSMENT / PLAN CSW unable to complete full assessment with pt who presents with dementia. CSW familiar with this pt who is a resident at [facility]. CSW contacted pt emergency contact Mackenzie [name] who informed CSW that she is no longe pt caregiver, would like her informatin removed from the face sheet however pt is a resident of [facility]. CSW left a vm for pt niece Mackenzie [name]. CSW also confirmed with Mackenzie [name] that pt able to return when stable.   Assessment/plan status:  Psychosocial Support/Ongoing Assessment of Needs Other assessment/ plan:   Information/referral to community resources:    PATIENT'S/FAMILY'S RESPONSE TO PLAN OF CARE: CSW confirmed with pt prior care giver and facility that pt a resident of [facility]. CSW spoke with pt niece Mackenzie [name] who confirmed pt is from [facility] and the plan is  for her to return when shes stable.

## 2011-11-21 NOTE — Consult Note (Addendum)
[facility] [place] CONSULT NOTE 11/21/2011 DOB: [id number] MRN : [id number]  BJ:YNWGN foot pain  History of Present Illness: Mackenzie [name] is a 78 y.o. female who had a Redo R common femoral exposure, Right Axillary to Femoral bypass with 8mm ringed PTFE on 08/15/11, by Dr. Myra Gianotti. She returned to [facility] on 08/29/11 for wound care to right groin wound which is healing. She was admitted with severe pain in her left foot esp. 1-3 toes and has large painful eschar on left heel.  CTA 08/15/11 IMPRESSION:  1. Complete thrombosis of the aortobifemoral graft. The right  limb occlusion is new since the 2010 scan.  2. Diffuse right femoropopliteal occlusive disease with incomplete  evaluation of distal trifurcation runoff as above.  3. Diffuse left femoropopliteal disease with reconstitution of  contiguous posterior tibial runoff.  4. Right inguinal hernia containing only mesenteric fat.  5. Scattered sigmoid diverticula.   Past Medical History  Diagnosis Date  . Delirium 12/10/08    hospitalized  . Pelvic fracture 07/01/09    hospitalized  . Coronary artery disease   . Femoral neck fracture   . Anemia   . Hypertension   . Diabetes mellitus   . Dementia   . GI bleed   . Microcytic anemia   . Diverticulosis   . Compression fracture   . Gastric ulcer   . AVM (arteriovenous malformation)     of rectum  . Gastroparesis   . Hiatal hernia 1997  . Esophageal stricture 1997  . Anxiety   . Depression   . GERD (gastroesophageal reflux disease)   . Gout   . Psoriasis   . DJD (degenerative joint disease)   . Vascular disease     Past Surgical History  Procedure Date  . Cholecystectomy   . Cataract extraction   . Axillary-femoral bypass graft 08/15/2011    Procedure: BYPASS GRAFT AXILLA-BIFEMORAL;  Surgeon: [name], MD;  Location: [facility] OR;  Service: Vascular;  Laterality: Right;  Embolectomy of Right Femoral Artery with Right  Axilla Femoral Bypass Graft.  .  Total hip arthroplasty     left  . Hemorrhoid surgery   . Incisional hernia repair   . Aortobifemoral bypass graft 10/12/1992  . Common iliac thrombectomy 10/12/1992    with right profunda femoris embolectomy and left popliteal artery transfemoral embolectomy`     ROS: [x]  Positive  [ ]  Denies    General: [ ]  Weight loss, [ ]  Fever, [ ]  chills Neurologic: [ ]  Dizziness, [ ]  Blackouts, [ ]  Seizure [ ]  Stroke, [ ]  "Mini stroke", [ ]  Slurred speech, [ ]  Temporary blindness; [ ]  weakness in arms or legs, [ ]  Hoarseness Cardiac: [ ]  Chest pain/pressure, [ ]  Shortness of breath at rest [ ]  Shortness of breath with exertion, [ ]  Atrial fibrillation or irregular heartbeat Vascular: [ ]  Pain in legs with walking, [x ] Pain in legs at rest, [ ]  Pain in legs at night,  [ ]  Non-healing ulcer, [ ]  Blood clot in vein/DVT,   Pulmonary: [ ]  Home oxygen, [ ]  Productive cough, [ ]  Coughing up blood, [ ]  Asthma,  [ ]  Wheezing Musculoskeletal:  [ ]  Arthritis, [ ]  Low back pain, [ ]  Joint pain Hematologic: [ ]  Easy Bruising, [x ] Anemia; [ ]  Hepatitis Gastrointestinal: [ ]  Blood in stool, [ ]  Gastroesophageal Reflux/heartburn, [ ]  Trouble swallowing Urinary: [ ]  chronic Kidney disease, [ ]  on HD - [ ]  MWF or [ ]   TTHS, [ ]  Burning with urination, [ ]  Difficulty urinating Skin: [ ]  Rashes, [x]  Wounds Psychological: [ ]  Anxiety, [ ]  Depression  Social History History  Substance Use Topics  . Smoking status: Former Games developer  . Smokeless tobacco: Never Used  . Alcohol Use: No    Family History Family History  Problem Relation Age of Onset  . Heart failure Father     died age 76  . Stroke Mother     died age 59  . Heart attack Father   . Heart disease Sister   . Heart disease Brother   . Colon cancer Neg Hx   . Diabetes      No Known Allergies  Current Facility-Administered Medications  Medication Dose Route Frequency Provider Last Rate Last Dose  . 0.9 %  sodium chloride infusion   Intravenous  Continuous Ardyth Gal, MD 75 mL/hr at 11/21/11 0012    . albuterol (PROVENTIL) (5 MG/ML) 0.5% nebulizer solution 2.5 mg  2.5 mg Nebulization Q4H PRN Ardyth Gal, MD      . alum & mag hydroxide-simeth (MAALOX/MYLANTA) 200-200-20 MG/5ML suspension 30 mL  30 mL Oral Q6H PRN Ardyth Gal, MD      . clindamycin (CLEOCIN) IVPB 600 mg  600 mg Intravenous Once Rolan Bucco, MD   600 mg at 11/20/11 2133  . donepezil (ARICEPT) tablet 10 mg  10 mg Oral QHS Ardyth Gal, MD      . feeding supplement (PRO-STAT SUGAR FREE 64) liquid 30 mL  30 mL Oral BID Ardyth Gal, MD      . ferrous sulfate tablet 325 mg  325 mg Oral TID WC Ardyth Gal, MD   325 mg at 11/21/11 0801  . heparin injection 5,000 Units  5,000 Units Subcutaneous Q8H Ardyth Gal, MD   5,000 Units at 11/21/11 0530  . hydrochlorothiazide (HYDRODIURIL) tablet 25 mg  25 mg Oral Daily Ardyth Gal, MD   25 mg at 11/21/11 0936  . insulin aspart (novoLOG) injection 0-9 Units  0-9 Units Subcutaneous Q4H Ardyth Gal, MD      . metoprolol (LOPRESSOR) tablet 50 mg  50 mg Oral BID Ardyth Gal, MD   50 mg at 11/21/11 1610  . mirtazapine (REMERON) tablet 15 mg  15 mg Oral QHS Ardyth Gal, MD      . oxyCODONE (Oxy IR/ROXICODONE) immediate release tablet 5 mg  5 mg Oral Q4H PRN Ardyth Gal, MD   5 mg at 11/21/11 0801  . piperacillin-tazobactam (ZOSYN) IVPB 3.375 g  3.375 g Intravenous Once Ardyth Gal, MD   3.375 g at 11/21/11 0159  . piperacillin-tazobactam (ZOSYN) IVPB 3.375 g  3.375 g Intravenous Q8H Ardyth Gal, MD   3.375 g at 11/21/11 0945  . potassium chloride SA (K-DUR,KLOR-CON) CR tablet 20 mEq  20 mEq Oral Daily Ardyth Gal, MD   20 mEq at 11/21/11 0936  . simvastatin (ZOCOR) tablet 40 mg  40 mg Oral QHS Ardyth Gal, MD      . traMADol Janean Sark) tablet 50 mg  50 mg Oral Q6H PRN Ardyth Gal, MD   50 mg at 11/21/11 0530  . vancomycin  (VANCOCIN) 1,250 mg in sodium chloride 0.9 % 250 mL IVPB  1,250 mg Intravenous QHS Ardyth Gal, MD   1,250 mg at 11/21/11 0159  . venlafaxine XR (EFFEXOR-XR) 24 hr capsule 75 mg  75 mg Oral Daily Ardyth Gal, MD   75 mg at 11/21/11 0936  . vitamin C (ASCORBIC ACID) tablet 500 mg  500  mg Oral BID Ardyth Gal, MD   500 mg at 11/21/11 0936  . DISCONTD: alum & mag hydroxide-simeth (MAALOX PLUS) 400-400-40 MG/5ML suspension 30 mL  30 mL Oral Q6H PRN Ardyth Gal, MD         Imaging: No results found.  Significant Diagnostic Studies: CBC Lab Results  Component Value Date   WBC 10.1 11/21/2011   HGB 11.5* 11/21/2011   HCT 36.6 11/21/2011   MCV 74.8* 11/21/2011   PLT 420* 11/21/2011    BMET    Component Value Date/Time   NA 134* 11/21/2011 0510   K 3.6 11/21/2011 0510   CL 95* 11/21/2011 0510   CO2 26 11/21/2011 0510   GLUCOSE 74 11/21/2011 0510   BUN 16 11/21/2011 0510   CREATININE 0.44* 11/21/2011 0510   CREATININE 0.58 05/24/2011 1046   CALCIUM 9.4 11/21/2011 0510   GFRNONAA >90 11/21/2011 0510   GFRAA >90 11/21/2011 0510    COAG Lab Results  Component Value Date   INR 1.09 08/29/2011   INR 0.98 08/15/2011   INR 1.03 07/01/2009   No results found for this basename: PTT     Physical Examination  Patient Vitals for the past 24 hrs:  BP Temp Temp src Pulse Resp SpO2 Height Weight  11/21/11 0528 133/50 mmHg 97.7 F (36.5 C) - 116  20  100 % - -  11/21/11 0500 - - - - - - - 150 lb 8 oz (68.266 kg)  11/20/11 2351 - - - - - - 5' 6.93" (1.7 m) 165 lb 12.6 oz (75.2 kg)  11/20/11 2330 135/34 mmHg 98.3 F (36.8 C) - 99  18  95 % - -  11/20/11 2127 133/36 mmHg 96.3 F (35.7 C) Oral 108  18  94 % - -  11/20/11 1547 133/66 mmHg 97.8 F (36.6 C) Oral 95  20  98 % - -   Pulse Readings from Last 3 Encounters:  11/21/11 116  10/26/11 76  09/13/11 114    General:  WDWN in NAD Pulmonary: normal non-labored breathing ,  Vascular Exam/Pulses: monophasic PT signal  on left, No DP/AT or peroneal signal Left foot ischemic changes - demarcating over 1st -3rd toes, dry Gangrene of heel , no cellulitis; no open wounds;  Toes and heel are tender Musculoskeletal: no muscle wasting or atrophy  Neurologic: A&O X 3; Appropriate Affect ;  SENSATION: normal; MOTOR FUNCTION:  moving all extremities equally.  Speech is fluent/normal  ASSESSMENT: Ischemic pain left foot with multiple toes left foot demarcating. Dry gangrene left heel  PLAN: continue antibiotics Will add IV pain meds Pt may need further studies to determine viability of the left leg and foot Previous CTA could not evaluate left distal runoff well If no further revascularization options, pt may need amputation. Elevate heel ? Duplex ax fem Bypass Xray- right heel - R/O osteo   Agree with above.  After right Ax-Fem, the patient was able to salvage her right leg.  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  This will likely result in amputation, however, I will order a CTA to evaluate her left leg blood flow to evaluate for possible bypass targets.  In February, her family was very stern regarding limb salvage, so I assume they will feel the same way this time.  Durene Cal

## 2011-11-21 NOTE — Progress Notes (Signed)
Family Medicine Teaching Service Attending Note  I discussed patient Mackenzie [name]  with Dr. Clinton Sawyer and reviewed their note for today.  I agree with their assessment and plan.

## 2011-11-21 NOTE — Progress Notes (Signed)
Patient has dementia.  Pneumonia and influenza vaccination history reviewed with patient.  Patient stated she thought she has had the influenza vaccination but cannot recall whether she had the pneumonia vaccination.  I attempted to call [name] to get vaccination records, however, was unable to reach anyone.  No family is present at this time.  Mackenzie [name]

## 2011-11-21 NOTE — Progress Notes (Signed)
ANTIBIOTIC CONSULT NOTE - INITIAL  Pharmacy Consult for vancomycin and Zosyn Indication: Cellulitis  No Known Allergies  Patient Measurements: Height: 5' 6.93" (170 cm) Weight: 165 lb 12.6 oz (75.2 kg) (Per 09/13/11 documentation) IBW/kg (Calculated) : 61.44   Vital Signs: Temp: 98.3 F (36.8 C) (05/20 2330) Temp src: Oral (05/20 2127) BP: 135/34 mmHg (05/20 2330) Pulse Rate: 99  (05/20 2330) Intake/Output from previous day:   Intake/Output from this shift:    Labs:  Basename 11/20/11 1640  WBC 11.6*  HGB 12.1  PLT 518*  LABCREA --  CREATININE 0.44*   Estimated Creatinine Clearance: 58.2 ml/min (by C-G formula based on Cr of 0.44). No results found for this basename: VANCOTROUGH:2,VANCOPEAK:2,VANCORANDOM:2,GENTTROUGH:2,GENTPEAK:2,GENTRANDOM:2,TOBRATROUGH:2,TOBRAPEAK:2,TOBRARND:2,AMIKACINPEAK:2,AMIKACINTROU:2,AMIKACIN:2, in the last 72 hours   Microbiology: No results found for this or any previous visit (from the past 720 hour(s)).  Medical History: Past Medical History  Diagnosis Date  . Delirium 12/10/08    hospitalized  . Pelvic fracture 07/01/09    hospitalized  . Coronary artery disease   . Femoral neck fracture   . Anemia   . Hypertension   . Diabetes mellitus   . Dementia   . GI bleed   . Microcytic anemia   . Diverticulosis   . Compression fracture   . Gastric ulcer   . AVM (arteriovenous malformation)     of rectum  . Gastroparesis   . Hiatal hernia 1997  . Esophageal stricture 1997  . Anxiety   . Depression   . GERD (gastroesophageal reflux disease)   . Gout   . Psoriasis   . DJD (degenerative joint disease)   . Vascular disease     Medications:  Scheduled:    . clindamycin (CLEOCIN) IV  600 mg Intravenous Once  . donepezil  10 mg Oral QHS  . feeding supplement  30 mL Oral BID  . ferrous sulfate  325 mg Oral TID WC  . heparin  5,000 Units Subcutaneous Q8H  . hydrochlorothiazide  25 mg Oral Daily  . insulin aspart  0-9 Units  Subcutaneous Q4H  . metoprolol  50 mg Oral BID  . mirtazapine  15 mg Oral QHS  . potassium chloride SA  20 mEq Oral Daily  . simvastatin  40 mg Oral QHS  . venlafaxine XR  75 mg Oral Daily  . vitamin C  500 mg Oral BID   Assessment: 78 yo female admitted with cellulitis and possible ischemia of right foot. Pharmacy consulted to manage vancomycin and Zosyn.   Goal: Vancomycin trough 10-20 mcg/mL  Plan:  1. Zosyn 3.375gm IV Q8H (4 hr infusion).  2. Vancomycin 1250mg  IV Q24H.  Emeline Gins 11/21/2011,12:40 AM

## 2011-11-21 NOTE — Progress Notes (Signed)
Daily Progress Note Mackenzie [name]. Mackenzie [name], M.D., M.B.A  Family Medicine PGY-1 Pager [phone]    Subjective: Patient lying in bed this morning and states that her left foot hurts, but is improved with pain medication. When asked about how long her the ulcers have been present, she states that she doesn't know and "I can't remember my own name half the time."   Objective: Vital signs in last 24 hours: Temp:  [96.3 F (35.7 C)-98.3 F (36.8 C)] 98 F (36.7 C) (05/21 1300) Pulse Rate:  [81-116] 81  (05/21 1300) Resp:  [16-20] 16  (05/21 1300) BP: (126-135)/(34-70) 126/70 mmHg (05/21 1300) SpO2:  [94 %-100 %] 94 % (05/21 1300) Weight:  [150 lb 8 oz (68.266 kg)-165 lb 12.6 oz (75.2 kg)] 150 lb 8 oz (68.266 kg) (05/21 0500) Weight change:  Last BM Date:  (PTA, pt unable to inform)  Intake/Output from previous day: 05/20 0701 - 05/21 0700 In: 735 [I.V.:435; IV Piggyback:300] Out: -  Intake/Output this shift:   General: lying in bed in fetal position, awake, alert, pleasant   HEENT: PERRL, EOMI, pharynx non-erythematous, MMM  Heart: regular rate, no murmurs  Lungs: Normal respiratory effort. Lungs CTABL, no crackles or wheezes.  Abdomen: SNTND, no guarding  Extremities: negative edema,  Skin: First three toes on right foot are dusky, foot is cold. Dorsum of foot is erythematous, pulses difficult to palpate. There is a 6x6 cm dark eschar on heel of right foot. Left leg with 3x8 cm open wound, minimal erythema and clear drainage, pulses 1+ in left.  Neurology:Patient oriented to self, knows she is hospital, but not sure which one, and disoriented to time, and recent memory is not preserved.     Lab Results:  Basename 11/21/11 0510 11/21/11 0052  WBC 10.1 10.8*  HGB 11.5* 12.0  HCT 36.6 38.1  PLT 420* 436*   BMET  Basename 11/21/11 0510 11/21/11 0052 11/20/11 1640  NA 134* -- 133*  K 3.6 -- 3.9  CL 95* -- 93*  CO2 26 -- 28  GLUCOSE 74 -- 106*  BUN 16 -- 19  CREATININE  0.44* 0.49* --  CALCIUM 9.4 -- 9.3    Studies/Results: No results found.  Medications: I have reviewed the patient's current medications.  Assessment/Plan: 78 year old female with PMHx of Peripheral vascular disease, HTN, DM, HLD, CAD, who presents from assisted living with probably right foot cellulitis and ischemia of right foot, as well as open wound on left foot.    # Cellulitis- patient afebrile, but has elevated WBC count. Was started on vanc and zosyn, but there is not great evidence of infectious process, so we may be able to de-escalate antibiotics to PO - defer to vascular surgery - consult wound care  # Ischemia- hx of right axillary to femoral bypass 2/13; exam consistent with ischemic toes/foot.  - f/u VVS recs - oxycodone PRN  # DM- will place on SSI  #HTN- will continue home BP medications  #HLD- continue home simvastatin  #Psych/Neuro- continue home aricept, remeron, effexor.  #FEN/GI: NPO tonight, will give MIVF @75  cc/hr. Continue home potassium replacement, re-check electrolytes in am.   # Prophylaxis: SQ heparin  #Disposition: Pending clinical improvement.    LOS: 1 day   Mat Carne 11/21/2011, 1:41 PM

## 2011-11-21 NOTE — H&P (Signed)
Family Medicine Teaching [facility] Admission History and Physical  Patient name: Mackenzie [name] Medical record number: [id number] Date of birth: 06/15/1928 Age: 78 y.o. Gender: female  Primary Care Provider: Dr. Deirdre Priest  Chief Complaint: Foot pain, wound infection History of Present Illness:Mackenzie [name] is a 78 year old female who presented to [facility] ED earlier today from a Assisted living with foot pain of her right foot, as well as an open ulcer on her right heel and left shin.  She has significant peripheral vascular disease and the ER provider was concerned about a cellulitis and ischemia to the right foot.    The patient is demented and can only tell me her foot is hurting, she cannot tell me how long or provide any further history.   Patient Active Problem List  Diagnoses  . THYROID NODULE  . DIABETES MELLITUS II, UNCOMPLICATED  . MACULAR DEGENERATION  . HYPERTENSION, BENIGN SYSTEMIC  . PSORIASIS  . WALKING DIFFICULTY  . INCONTINENCE, MIXED, URGE/STRESS  . Osteoporosis, senile  . Depression  . Dementia  . Wrist pain  . Postoperative anemia due to acute blood loss  . DM (diabetes mellitus)  . PVD (peripheral vascular disease)  . HTN (hypertension)  . GERD (gastroesophageal reflux disease)  . CAD (coronary artery disease)  . Hypokalemia  . Abnormal TSH  . Cellulitis and abscess of trunk   Past Medical History: Past Medical History  Diagnosis Date  . Delirium 12/10/08    hospitalized  . Pelvic fracture 07/01/09    hospitalized  . Coronary artery disease   . Femoral neck fracture   . Anemia   . Hypertension   . Diabetes mellitus   . Dementia   . GI bleed   . Microcytic anemia   . Diverticulosis   . Compression fracture   . Gastric ulcer   . AVM (arteriovenous malformation)     of rectum  . Gastroparesis   . Hiatal hernia 1997  . Esophageal stricture 1997  . Anxiety   . Depression   . GERD (gastroesophageal reflux disease)   . Gout   . Psoriasis   . DJD  (degenerative joint disease)   . Vascular disease     Past Surgical History: Past Surgical History  Procedure Date  . Cholecystectomy   . Cataract extraction   . Axillary-femoral bypass graft 08/15/2011    Procedure: BYPASS GRAFT AXILLA-BIFEMORAL;  Surgeon: [name], MD;  Location: [facility] OR;  Service: Vascular;  Laterality: Right;  Embolectomy of Right Femoral Artery with Right  Axilla Femoral Bypass Graft.  . Total hip arthroplasty     left  . Hemorrhoid surgery   . Incisional hernia repair   . Aortobifemoral bypass graft 10/12/1992  . Common iliac thrombectomy 10/12/1992    with right profunda femoris embolectomy and left popliteal artery transfemoral embolectomy`    Social History: History   Social History  . Marital Status: Married    Spouse Name: Leonette Most    Number of Children: N/A  . Years of Education: 21   Social History Main Topics  . Smoking status: Former Games developer  . Smokeless tobacco: Never Used  . Alcohol Use: No  . Drug Use: No  . Sexually Active: Not Currently    Birth Control/ Protection: Post-menopausal   Other Topics Concern  . None   Social History Narrative   Her husband died in 2011/06/12.   She is now living in [place] assisted living.  Daughter, Clemetine Marker, is closest  in [place], [place] close to [place], Laymond Purser lives locallySon Robbie  in [place]    Allergies: No Known Allergies  Current Facility-Administered Medications  Medication Dose Route Frequency Provider Last Rate Last Dose  . 0.9 %  sodium chloride infusion   Intravenous Continuous Ardyth Gal, MD      . albuterol (PROVENTIL) (5 MG/ML) 0.5% nebulizer solution 2.5 mg  2.5 mg Nebulization Q4H PRN Ardyth Gal, MD      . alum & mag hydroxide-simeth (MAALOX PLUS) 400-400-40 MG/5ML suspension 30 mL  30 mL Oral Q6H PRN Ardyth Gal, MD      . clindamycin (CLEOCIN) IVPB 600 mg  600 mg Intravenous Once Rolan Bucco, MD   600 mg at 11/20/11 2133  . donepezil  (ARICEPT) tablet 10 mg  10 mg Oral QHS Ardyth Gal, MD      . feeding supplement (PRO-STAT SUGAR FREE 64) liquid 30 mL  30 mL Oral BID Ardyth Gal, MD      . ferrous sulfate tablet 325 mg  325 mg Oral TID WC Ardyth Gal, MD      . heparin injection 5,000 Units  5,000 Units Subcutaneous Q8H Ardyth Gal, MD      . hydrochlorothiazide (HYDRODIURIL) tablet 25 mg  25 mg Oral Daily Ardyth Gal, MD      . insulin aspart (novoLOG) injection 0-9 Units  0-9 Units Subcutaneous Q4H Ardyth Gal, MD      . metoprolol (LOPRESSOR) tablet 50 mg  50 mg Oral BID Ardyth Gal, MD      . mirtazapine (REMERON) tablet 15 mg  15 mg Oral QHS Ardyth Gal, MD      . oxyCODONE (Oxy IR/ROXICODONE) immediate release tablet 5 mg  5 mg Oral Q4H PRN Ardyth Gal, MD      . potassium chloride SA (K-DUR,KLOR-CON) CR tablet 20 mEq  20 mEq Oral Daily Ardyth Gal, MD      . simvastatin (ZOCOR) tablet 40 mg  40 mg Oral QHS Ardyth Gal, MD      . traMADol Janean Sark) tablet 50 mg  50 mg Oral Q6H PRN Ardyth Gal, MD      . venlafaxine XR (EFFEXOR-XR) 24 hr capsule 75 mg  75 mg Oral Daily Ardyth Gal, MD      . vitamin C (ASCORBIC ACID) tablet 500 mg  500 mg Oral BID Ardyth Gal, MD       Review Of Systems: Unable to obtain, level 5 caveat due to dementia   Physical Exam: Filed Vitals:   11/20/11 2127  BP: 133/36  Pulse: 108  Temp: 96.3 F (35.7 C)  Resp: 18   General: lying in bed in fetal position, sleeping, wakes to voice and answers questions but doses off HEENT: PERRL, EOMI, pharynx non-erythematous, MMM Heart: regular rate, no murmurs Lungs: Normal respiratory effort. Lungs CTABL, no crackles or wheezes. Abdomen: SNTND, no guarding  Extremities: negative edema,  Skin: First three toes on right foot are dusky, foot is cold.  Dorsum of foot is erythematous, pulses difficult to palpate.  There is a 6x6 cm dark eschar on heel of  right foot. Left leg with 3x8 cm open wound, minimal erythema and clear drainage, pulses 1+ in left.  Neurology:Patient oriented to self, knows she is hospital, but not sure which one, and disoriented to time, and recent memory is not preserved.   Lab Results  Component Value Date   WBC 11.6* 11/20/2011   HGB 12.1 11/20/2011   HCT 38.2 11/20/2011  MCV 74.9* 11/20/2011   PLT 518* 11/20/2011   Lab Results  Component Value Date   CREATININE 0.44* 11/20/2011   BUN 19 11/20/2011   NA 133* 11/20/2011   K 3.9 11/20/2011   CL 93* 11/20/2011   CO2 28 11/20/2011    Assessment and Plan: 78 year old female with PMHx of Peripheral vascular disease, HTN, DM, HLD, CAD, who presents from assisted living with probably right foot cellulitis and ischemia of right foot, as well as open wound on left foot.  1. Cellulitis- patient afebrile, but has elevated WBC count.  Unfortunately, blood cultures were not ordered in the ED prior to patient receiving Clindamycin.  After reviewing her chart, she is a diabetic patient and I feel that coverage for both MRSA, anaerobes, and pseudomonas is indicated.  Will order Vanc/Zosyn. Monitor WBC and for fevers. 2. Ischemia- exam consistent with ischemic toes/foot.  Vascular surgery was called to see the [facility] ER, who asked for patient to be admitted to [facility] stating they would see her in the am.  Will control her pain with her home dose of oxycodone and tramadol as needed for pain. Will make her NPO after midnight in case of surgical management tomorrow.  3. DM- will place on SSI  4. HTN- will continue home BP medications 5. HLD- continue home simvastatin 6. Psych/Neuro- continue home aricept, remeron, effexor.  7. FEN/GI: NPO tonight, will give MIVF @75  cc/hr.  Continue home potassium replacement, re-check electrolytes in am.  8. Prophylaxis: SQ heparin 9. Disposition: Pending clinical improvement.   Montgomery Favor 11/21/2011 12:36 AM

## 2011-11-21 NOTE — H&P (Signed)
Family Medicine Teaching Service Attending Note  I interviewed and examined patient Mackenzie [name] and reviewed their tests and x-rays.  I discussed with Dr. Lula Olszewski and reviewed their note for today.  I agree with their assessment and plan.     Additionally  This AM complains of pain in her legs R heels with ischar no signs of active infection.  Toes dusky and tender Left shin bandaged - minimal erythema no purulent discharge Consult vascular Do not think any acute infection - wound care consult and narrow antibiotics or none at all Need family meeting for goals of care

## 2011-11-22 LAB — CBC
Hemoglobin: 11.3 g/dL — ABNORMAL LOW (ref 12.0–15.0)
Platelets: 330 10*3/uL (ref 150–400)
RBC: 4.73 MIL/uL (ref 3.87–5.11)
WBC: 9.1 10*3/uL (ref 4.0–10.5)

## 2011-11-22 LAB — BASIC METABOLIC PANEL
CO2: 25 mEq/L (ref 19–32)
Chloride: 96 mEq/L (ref 96–112)
Sodium: 132 mEq/L — ABNORMAL LOW (ref 135–145)

## 2011-11-22 MED ORDER — INSULIN ASPART 100 UNIT/ML ~~LOC~~ SOLN: 0.0000 [IU] | Freq: Every day | SUBCUTANEOUS | Status: DC

## 2011-11-22 MED ORDER — INSULIN ASPART 100 UNIT/ML ~~LOC~~ SOLN: 0.0000 [IU] | Freq: Three times a day (TID) | SUBCUTANEOUS | Status: DC

## 2011-11-22 MED ORDER — POLYETHYLENE GLYCOL 3350 17 G PO PACK
17.0000 g | PACK | Freq: Two times a day (BID) | ORAL | Status: DC
  Administered 2011-11-22 – 2011-11-24 (×5): 17 g via ORAL
  Filled 2011-11-22 (×5): qty 1

## 2011-11-22 MED ORDER — ENOXAPARIN SODIUM 40 MG/0.4ML ~~LOC~~ SOLN
40.0000 mg | SUBCUTANEOUS | Status: DC
  Administered 2011-11-22 – 2011-11-24 (×3): 40 mg via SUBCUTANEOUS
  Filled 2011-11-22 (×3): qty 0.4

## 2011-11-22 NOTE — Progress Notes (Signed)
Daily Progress Note Si Raider. Clinton Sawyer, M.D., M.B.A  Family Medicine PGY-1 Pager [phone]  Subjective: Patient lying in bed this morning states that she is uncomfortable, but cannot express more; Is very confused and thinks that she's at home  Objective: Vital signs in last 24 hours: Temp:  [98 F (36.7 C)-98.5 F (36.9 C)] 98.5 F (36.9 C) (05/22 1478) Pulse Rate:  [76-81] 76  (05/22 0608) Resp:  [16] 16  (05/22 0608) BP: (112-126)/(58-70) 112/58 mmHg (05/22 0608) SpO2:  [94 %-98 %] 98 % (05/22 0608) Weight:  [153 lb 3.5 oz (69.5 kg)] 153 lb 3.5 oz (69.5 kg) (05/22 0645) Weight change: -12 lb 9.1 oz (-5.7 kg) Last BM Date:  (PTA, pt unable to inform)  Intake/Output from previous day: 05/21 0701 - 05/22 0700 In: 1235 [P.O.:360; I.V.:875] Out: -  Intake/Output this shift:   General: lying in bed in fetal position, awake, alert, pleasant   HEENT: PERRL, EOMI, pharynx non-erythematous, MMM  Heart: regular rate, no murmurs  Lungs: Normal respiratory effort. Lungs CTABL, no crackles or wheezes.  Abdomen: SNTND, no guarding  Extremities: negative edema,  Skin: First three toes on right foot are dusky, foot is cold. Dorsum of foot is erythematous, pulses difficult to palpate. There is a 6x6 cm dark eschar on heel of right foot. Left leg with 3x8 cm open wound, minimal erythema and clear drainage, pulses 1+ in left. Plaque psoriasis of scalp  Neurology:Patient oriented to self, knows she is hospital, but not sure which one, and disoriented to time, and recent memory is not preserved.    Lab Results:  Basename 11/21/11 0510 11/21/11 0052  WBC 10.1 10.8*  HGB 11.5* 12.0  HCT 36.6 38.1  PLT 420* 436*   BMET  Basename 11/21/11 0510 11/21/11 0052 11/20/11 1640  NA 134* -- 133*  K 3.6 -- 3.9  CL 95* -- 93*  CO2 26 -- 28  GLUCOSE 74 -- 106*  BUN 16 -- 19  CREATININE 0.44* 0.49* --  CALCIUM 9.4 -- 9.3    Studies/Results: CT-Angiogram 5/21: IMPRESSION:  1. 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  2. Patent right ax-fem bypass graft.  3. Left SFA occlusion with distal popliteal artery  reconstitution and diseased posterior tibial runoff.  4. Long segment right SFA occlusion with distal popliteal artery  reconstitution, diseased anterior tibial and peroneal runoff.  5. Large hiatal hernia.  6. Increased intrahepatic biliary ductal dilatation post  cholecystectomy  Medications: I have reviewed the patient's current medications.  Assessment/Plan: 78 year old female with PMHx of Peripheral vascular disease, HTN, DM, HLD, CAD, who presents from assisted living with probably right foot cellulitis and ischemia of right foot, as well as open wound on left foot.    # Ischemia- hx of right axillary to femoral bypass 2/13; exam consistent with ischemic toes/foot.  - oxycodone PRN - CT- A performed 5/21shows multiple locations of vessel occlusion, will defer to VVS for recommendations; very appreciative of their help   # Cellulitis- possible infectious component to bilateral LE wounds; started on vanc/zosyn but de-escalated as not signs of severe infection - Bactroban to right shin wound  - doxy and cipro for broad coverage in LE wounds of diabetics  # Social - Medical POA is niece Larita Fife, I spoke to her yesterday to update, but did not have CTA at the time;   - Dr. Deirdre Priest would like to have end of life care discussion with the niece as patient is severely demented  and all interventions should be aimed at improving quality of life vs prolonging it  # DM- will place on SSI  #HTN- will continue home BP medications  #HLD- continue home simvastatin  #Psych/Neuro- continue home aricept, remeron, effexor.  #FEN/GI: carb modified diet; will give MIVF @75  cc/hr. Continue home potassium replacement, re-check electrolytes in am.   # Prophylaxis: SQ heparin  #Disposition: Pending clinical improvement.    LOS: 2 days   Mat Carne 11/22/2011, 9:42 AM

## 2011-11-22 NOTE — Progress Notes (Signed)
UR COMPLETED  

## 2011-11-22 NOTE — Progress Notes (Addendum)
[facility]  Progress Note Vascular Surgery   History of Present Illness  Mackenzie [name] is a 78 y.o. female who has ischemic changes with heel ulcer  CT angio 11/21/11: IMPRESSION:  1. 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  2. Patent right ax-fem bypass graft.  3. Left SFA occlusion with distal popliteal artery  reconstitution and diseased posterior tibial runoff.  4. Long segment right SFA occlusion with distal popliteal artery  reconstitution, diseased anterior tibial and peroneal runoff.  5. Large hiatal hernia.  6. Increased intrahepatic biliary ductal dilatation post  cholecystectomy.   Significant Diagnostic Studies: CBC Lab Results  Component Value Date   WBC 9.1 11/22/2011   HGB 11.3* 11/22/2011   HCT 35.4* 11/22/2011   MCV 74.8* 11/22/2011   PLT 330 11/22/2011    BMET     Component Value Date/Time   NA 132* 11/22/2011 1040   K 3.3* 11/22/2011 1040   CL 96 11/22/2011 1040   CO2 25 11/22/2011 1040   GLUCOSE 149* 11/22/2011 1040   BUN 9 11/22/2011 1040   CREATININE 0.39* 11/22/2011 1040   CREATININE 0.58 05/24/2011 1046   CALCIUM 9.0 11/22/2011 1040   GFRNONAA >90 11/22/2011 1040   GFRAA >90 11/22/2011 1040    COAG Lab Results  Component Value Date   INR 1.09 08/29/2011   INR 0.98 08/15/2011   INR 1.03 07/01/2009   No results found for this basename: PTT    Physical Examination  BP Readings from Last 3 Encounters:  11/22/11 136/73  10/26/11 139/64  09/13/11 137/79   Temp Readings from Last 3 Encounters:  11/22/11 98 F (36.7 C)   10/26/11 97.8 F (36.6 C) Oral  09/13/11 98.1 F (36.7 C) Oral   SpO2 Readings from Last 3 Encounters:  11/22/11 99%  10/26/11 97%  09/13/11 99%   Pulse Readings from Last 3 Encounters:  11/22/11 80  10/26/11 76  09/13/11 114   BLE:pt wounds stable   Assessment/Plan: Pt. Leg and heel wounds unchanged Dr. Myra Gianotti to review CTA to decide re: definitive  treatment  ROCZNIAK,REGINA J [phone] 11/22/2011 1:44 PM  Agree with above I have reviewed her CTA.  Limited evaluation of left groin secondary to artifact from artificial hip. Based on the findings of her ischemic toes as well as her pressure sore on her left heel, I feel that even with re-vascularization, that the likelihood of limb salvage is very low.  The best option at this time would be above knee amputation.  I will need to discuss this with her family.  Durene Cal

## 2011-11-22 NOTE — Progress Notes (Signed)
Family Medicine Teaching Service Attending Note  I interviewed and examined patient Mackenzie [name] and reviewed their tests and x-rays.  I discussed with Dr. Clinton Sawyer and reviewed their note for today.  I agree with their assessment and plan.     Additionally  Mackenzie [name] denies pain but is confused Legs - unchanged Await Vascular recommendations I will contact niece who is HCPOA Stop antibiotics as no signs of active infection

## 2011-11-23 LAB — GLUCOSE, CAPILLARY
Glucose-Capillary: 133 mg/dL — ABNORMAL HIGH (ref 70–99)
Glucose-Capillary: 73 mg/dL (ref 70–99)
Glucose-Capillary: 167 mg/dL — ABNORMAL HIGH (ref 70–99)
Glucose-Capillary: 139 mg/dL — ABNORMAL HIGH (ref 70–99)
Glucose-Capillary: 164 mg/dL — ABNORMAL HIGH (ref 70–99)
Glucose-Capillary: 174 mg/dL — ABNORMAL HIGH (ref 70–99)

## 2011-11-23 NOTE — Progress Notes (Signed)
I met with Mackenzie [name] who is [name].   I explained the current medical situation.   [name] agrees that above the knee amputation would be in [name]'s best interest.   She also feels that [name] would not want any life saving procedures or resuscitation given her current medical problems that have little hope of improving.  I will place a copy of the Power of [name] paper work in [name] paper chart.  [name]

## 2011-11-23 NOTE — Progress Notes (Signed)
Pt has reddened area on entire buttocks. No open sores noted. Area cleaned and barrier cream applied.

## 2011-11-23 NOTE — Progress Notes (Addendum)
VVS progress Note:  Pt doing well. Only requiring one pain pill for relief of left foot pain.  Filed Vitals:   11/23/11 0300 11/23/11 0459 11/23/11 0500 11/23/11 1400  BP:  145/73  134/42  Pulse:  89  80  Temp:  97.7 F (36.5 C)  97.9 F (36.6 C)  TempSrc:  Oral    Resp:  18  20  Height:      Weight: 156 lb 6.4 oz (70.943 kg)  156 lb 6.4 oz (70.943 kg)   SpO2:  98%  100%   PE: LLE Toes demarcating. Left heel ulcer unchanged, no cellulitis Foot ischemia stable No signs of infection   Assessment/Plan: Spoke with Family medicine - Laymond Purser who is Mrs [facility] Power of [address].[name] agrees that above the knee amputation would be in Mrs Piche's best interest.   As pt is not in severe pain and there are no signs of infection there is not an emergent need to do amputation. Dr. Myra Gianotti in to assess Pt.  Agree with the above  Mrs Nourse was in much better spirits today.  She was not having a significant amount of pain and was laughing at times. I spoke with Laymond Purser, her power of Attorney via phone tonight.  We discussed amputation as well as palliative care measures.  At this point, I would recommend pain control and observation of her leg.  i would get the palliative care service involved.  If she needs amputation for pain control or for infection, I would be happy to take care of that.  Mrs Larinda Buttery was in agreement with this plan.   Durene Cal

## 2011-11-24 DIAGNOSIS — I96 Gangrene, not elsewhere classified: Secondary | ICD-10-CM | POA: Diagnosis present

## 2011-11-24 DIAGNOSIS — S80811A Abrasion, right lower leg, initial encounter: Secondary | ICD-10-CM | POA: Diagnosis present

## 2011-11-24 MED ORDER — HYDROCODONE-ACETAMINOPHEN 5-325 MG PO TABS: 1.0000 | ORAL_TABLET | Freq: Three times a day (TID) | ORAL | Status: AC

## 2011-11-24 MED ORDER — MUPIROCIN CALCIUM 2 % EX CREA: TOPICAL_CREAM | Freq: Every day | CUTANEOUS | Status: AC

## 2011-11-24 NOTE — Discharge Summary (Signed)
Physician Discharge Summary  Patient ID: Mackenzie [name] MRN: [id number] DOB/AGE: 1928-03-15 78 y.o.  Admit date: 11/20/2011 Discharge date: 11/24/2011  Admission Diagnoses: Left lower extremity pain Right lower extremity wound   Discharge Diagnoses:  Principal Problem:  *Gangrene of foot Active Problems:  DIABETES MELLITUS II, UNCOMPLICATED  HYPERTENSION, BENIGN SYSTEMIC  Depression  Dementia  PVD (peripheral vascular disease)  CAD (coronary artery disease)  Abrasion of lower extremity, right, initial encounter   Discharged Condition: stable  Hospital Course:   78 year old female with PMHx of Peripheral vascular disease , HTN, DM, HLD, CAD, who presented from assisted living with increasing pain in her left foot associated with ischemia.   # Left foot ischemia: patient with hx of PVD and right axillary to femoral bypass 2/13 performed by Dr Anner Crete Brabham;CT- A performed 5/21shows multiple locations of distal vessel occlusion of LLE; Determined by that amputation was only surgical option; Left above knee amputation was agreed to by patient's niece Mackenzie [name], who is healthcare POA; However, procedure not completed b/c pain well controlled on PO pain med and issue non-emergent; discharge on scheduled norco; may need above knee amputation in future if pain cannot be controlled or leg becomes infectious source  # Goals of Care: Mackenzie [name] is an 78 year old with advanced dementia. Her niece and healthcare POA, Mackenzie [name], had a goals of care meeting with out attending physician, Mackenzie Brownie, MD. It was decided that that patient would not want any interventions that prolong her life, but would rather focus on quality of life. Therefore, the patient's cholesterol medication was discontinued at discharge. Her hypertension medications were continued, but should be considered for removal given that they will likely add nothing to her quality of life. All other future interventions should keep  this goal of care mind.   # Right shin abrasion: unknown etiology; possible infectious but not overt cellulitis; started on vanc/zosyn but de-escalated as not signs of severe infection; treated with topical mupirocin, which was continued upon discharge; also placed with absorbant bandage  # DM: treated with sliding scale insulin; home meds continued at d/c   #HTN: kept on home BP medications; continued at d/c but consider withdrawing for comfort care  #HLD: continued home simvastatin while inpatient; discontinued at discharge   #Psych/Neuro: kept on home meds; continued home aricept, remeron, effexor upon discharge   Consults: vascular surgery  Significant Diagnostic Studies:   CT angio 11/21/11:  IMPRESSION:  1. 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  2. Patent right ax-fem bypass graft.  3. Left SFA occlusion with distal popliteal artery  reconstitution and diseased posterior tibial runoff.  4. Long segment right SFA occlusion with distal popliteal artery  reconstitution, diseased anterior tibial and peroneal runoff.  5. Large hiatal hernia.  6. Increased intrahepatic biliary ductal dilatation post  cholecystectomy.      Discharge Exam: Blood pressure 148/76, pulse 73, temperature 97.5 F (36.4 C), temperature source Oral, resp. rate 18, height 5' 6.93" (1.7 m), weight 154 lb 1.6 oz (69.9 kg), SpO2 100.00%. General: lying in bed in fetal position, awake, alert, pleasant  HEENT: PERRL, EOMI, pharynx non-erythematous, MMM  Heart: regular rate, no murmurs  Lungs: Normal respiratory effort. Lungs CTABL, no crackles or wheezes.  Abdomen: SNTND, no guarding  Skin: First three toes on right foot are dusky, foot is cold. Dorsum of foot is erythematous, pulses difficult to palpate. There is a 6x6 cm dark eschar on heel of right foot. Left leg with  3x8 cm open wound, minimal erythema and clear drainage, pulses 1+ in left.; open right shin abrasion with yellow  drainage; Plaque psoriasis of scalp  Neurology:Patient oriented to self, knows she is hospital, but not sure which one, and disoriented to time, and recent memory is not preserved.    Disposition: Assisted Living  Discharge Orders    Future Appointments: Provider: Department: Dept Phone: Center:   12/11/2011 10:30 AM Nada Libman, MD [facility] [phone] [facility]     Future Orders Please Complete By Expires   Discharge to SNF when bed available      Apply dressing      Scheduling Instructions:   Cover right leg wound with foam dressing. Change outer foam dressing Q 5 days or PRN soiling.     Medication List  As of 11/24/2011  3:46 PM   STOP taking these medications         CERTAVITE/ANTIOXIDANTS PO      nitrofurantoin (macrocrystal-monohydrate) 100 MG capsule      simvastatin 40 MG tablet      traMADol 50 MG tablet         TAKE these medications         albuterol (2.5 MG/3ML) 0.083% nebulizer solution   Commonly known as: PROVENTIL   Take 2.5 mg by nebulization every 6 (six) hours as needed. For wheezing/shortness of breath.      donepezil 10 MG tablet   Commonly known as: ARICEPT   Take 10 mg by mouth at bedtime.      feeding supplement Liqd   Take 30 mLs by mouth 2 (two) times daily.      ferrous sulfate 325 (65 FE) MG tablet   Take 325 mg by mouth 3 (three) times daily with meals.      hydrochlorothiazide 25 MG tablet   Commonly known as: HYDRODIURIL   Take 25 mg by mouth daily.      HYDROcodone-acetaminophen 5-325 MG per tablet   Commonly known as: NORCO   Take 1 tablet by mouth every 8 (eight) hours.      insulin aspart 100 UNIT/ML injection   Commonly known as: novoLOG   Inject 0-8 Units into the skin 4 (four) times daily -  before meals and at bedtime. Per sliding scale      metFORMIN 1000 MG tablet   Commonly known as: GLUCOPHAGE   Take 1,000 mg by mouth daily.      metoprolol 50 MG tablet   Commonly known as: LOPRESSOR   Take 50 mg by mouth  2 (two) times daily.      mirtazapine 15 MG tablet   Commonly known as: REMERON   Take 15 mg by mouth at bedtime.      mupirocin cream 2 %   Commonly known as: BACTROBAN   Apply topically daily. Apply topically daily for 7 days. Apply to right leg wound Q day. Cover with foam dressing.  Change outer foam dressing Q 5 days or PRN soiling      MYLANTA DOUBLE-STRENGTH 400-400-40 MG/5ML suspension   Generic drug: alum & mag hydroxide-simeth   Take 30 mLs by mouth every 6 (six) hours as needed. dyspepsia      nitroGLYCERIN 0.3 mg/hr   Commonly known as: NITRODUR - Dosed in mg/24 hr   Place 1 patch onto the skin daily. Apply in morning, remove at bedtime.      oxyCODONE 5 MG immediate release tablet   Commonly known as: Oxy IR/ROXICODONE  Take 5 mg by mouth every 4 (four) hours as needed. For pain.      potassium chloride SA 20 MEQ tablet   Commonly known as: K-DUR,KLOR-CON   Take 20 mEq by mouth daily.      PROBIOTIC PO   Take 1 capsule by mouth 2 (two) times daily. 14 day course of therapy; started 11/13/11      venlafaxine XR 75 MG 24 hr capsule   Commonly known as: EFFEXOR-XR   Take 75 mg by mouth daily.      vitamin C 500 MG tablet   Commonly known as: ASCORBIC ACID   Take 500 mg by mouth 2 (two) times daily.           Follow Up Issues: 1. Pain control for left lower extremity ischemia 2. Considering left above knee amputation as needed 3. Discontinuing medications that do not increase the patient's quality of life  Signed: Mat Carne 11/24/2011, 3:46 PM

## 2011-11-24 NOTE — Discharge Summary (Signed)
I have reviewed this discharge summary and agree.    

## 2011-11-24 NOTE — Progress Notes (Signed)
Clinical Child psychotherapist (CSW) informed that pt is ready for dc back to [facility]. CSW contacted facility who confirmed they are able to accept pt back. CSW submitted AVS and dc summary to facility via [facility] and informed pt POA Ms. Larinda Buttery of dc. CSW visited pt room and informed pt who was alert and agreeable to dc. CSW has prepared and placed dc packet in pt wall-a-roo, contacted [facility] for a non emergency ambulance and CSW will inform RN of nursing report to be given to facility. CSW signing off.  Theresia Bough, MSW, Theresia Majors [phone]

## 2011-11-28 LAB — GLUCOSE, CAPILLARY
Glucose-Capillary: 98 mg/dL (ref 70–99)
Glucose-Capillary: 113 mg/dL — ABNORMAL HIGH (ref 70–99)

## 2011-12-08 ENCOUNTER — Encounter: Payer: Self-pay | Admitting: Surgery

## 2011-12-10 DIAGNOSIS — W19XXXA Unspecified fall, initial encounter: Secondary | ICD-10-CM

## 2011-12-10 HISTORY — DX: Unspecified fall, initial encounter: W19.XXXA

## 2011-12-11 ENCOUNTER — Encounter: Payer: Self-pay | Admitting: Surgery

## 2011-12-11 ENCOUNTER — Ambulatory Visit (INDEPENDENT_AMBULATORY_CARE_PROVIDER_SITE_OTHER): Payer: Medicare Other | Admitting: Surgery

## 2011-12-11 VITALS — BP 141/65 | HR 81 | Resp 18 | Ht 65.0 in | Wt 154.0 lb

## 2011-12-11 DIAGNOSIS — I7025 Atherosclerosis of native arteries of other extremities with ulceration: Secondary | ICD-10-CM | POA: Insufficient documentation

## 2011-12-11 DIAGNOSIS — L98499 Non-pressure chronic ulcer of skin of other sites with unspecified severity: Secondary | ICD-10-CM

## 2011-12-11 DIAGNOSIS — I739 Peripheral vascular disease, unspecified: Secondary | ICD-10-CM

## 2011-12-11 NOTE — Progress Notes (Signed)
[facility]   Patient name: Mackenzie [name] MRN: [id number] DOB: 04-10-1928 Sex: female     Chief Complaint  Patient presents with  . Wound Check    left heel ulcer;  first, second,and third toes of left foot dark in color  . PVD  . Ulcer    HISTORY OF PRESENT ILLNESS: The patient was recently discharged from the hospital. She was admitted for subacute ischemia to the left leg. She is known to me for having undergone a right axillary femoral bypass graft in February for an ischemic right leg. She presented to the hospital in mid May with a left heel ulcer as well as ischemic toes. She had occluded the left limb of her aortobifemoral bypass graft. Due to the subacute nature of her symptoms I did not feel that she was a candidate for revascularization. I had multiple discussions with her power of attorney regarding options of care for which included palliative measures versus an amputation. The patient was discharged to home with plans for palliative type measures. She is here today for followup. She is complaining of severe pain in her left leg.  Past Medical History  Diagnosis Date  . Delirium 12/10/08    hospitalized  . Pelvic fracture 07/01/09    hospitalized  . Coronary artery disease   . Femoral neck fracture   . Anemia   . Hypertension   . Diabetes mellitus   . Dementia   . GI bleed   . Microcytic anemia   . Diverticulosis   . Compression fracture   . Gastric ulcer   . AVM (arteriovenous malformation)     of rectum  . Gastroparesis   . Hiatal hernia 1997  . Esophageal stricture 1997  . Anxiety   . Depression   . GERD (gastroesophageal reflux disease)   . Gout   . Psoriasis   . DJD (degenerative joint disease)   . Vascular disease   . Fall at nursing home 12-10-2011    patient states she fell in her room at nursing home last night, bruising and excoriations noted  on both arms     Past Surgical History  Procedure Date  .  Cholecystectomy   . Cataract extraction   . Axillary-femoral bypass graft 08/15/2011    Procedure: BYPASS GRAFT AXILLA-BIFEMORAL;  Surgeon: [name], MD;  Location: [facility] OR;  Service: Vascular;  Laterality: Right;  Embolectomy of Right Femoral Artery with Right  Axilla Femoral Bypass Graft.  . Total hip arthroplasty     left  . Hemorrhoid surgery   . Incisional hernia repair   . Aortobifemoral bypass graft 10/12/1992  . Common iliac thrombectomy 10/12/1992    with right profunda femoris embolectomy and left popliteal artery transfemoral embolectomy`    History   Social History  . Marital Status: Married    Spouse Name: Leonette Most    Number of Children: N/A  . Years of Education: 12   Occupational History  . Not on file.   Social History Main Topics  . Smoking status: Former Games developer  . Smokeless tobacco: Never Used  . Alcohol Use: No  . Drug Use: No  . Sexually Active: Not Currently    Birth Control/ Protection: Post-menopausal   Other Topics Concern  . Not on file   Social History Narrative   Her husband died in 2011-06-22.   She is now living in [place] assisted living.  Daughter, Clemetine Marker, is closest in [place], [place]  [place], [place]mond Purser lives locallySon Robbie  in [place]    Family History  Problem Relation Age of Onset  . Heart failure Father     died age 43  . Stroke Mother     died age 29  . Heart attack Father   . Heart disease Sister   . Heart disease Brother   . Colon cancer Neg Hx   . Diabetes      Allergies as of 12/11/2011  . (No Known Allergies)    Current Outpatient Prescriptions on File Prior to Visit  Medication Sig Dispense Refill  . albuterol (PROVENTIL) (2.5 MG/3ML) 0.083% nebulizer solution Take 2.5 mg by nebulization every 6 (six) hours as needed. For wheezing/shortness of breath.      Marland Kitchen alum & mag hydroxide-simeth (MYLANTA DOUBLE-STRENGTH) 400-400-40 MG/5ML suspension Take 30 mLs by mouth every 6 (six) hours as  needed. dyspepsia      . donepezil (ARICEPT) 10 MG tablet Take 10 mg by mouth at bedtime.      . ferrous sulfate 325 (65 FE) MG tablet Take 325 mg by mouth 3 (three) times daily with meals.      . hydrochlorothiazide (HYDRODIURIL) 25 MG tablet Take 25 mg by mouth daily.      . insulin aspart (NOVOLOG) 100 UNIT/ML injection Inject 0-8 Units into the skin 4 (four) times daily -  before meals and at bedtime. Per sliding scale      . metFORMIN (GLUCOPHAGE) 1000 MG tablet Take 1,000 mg by mouth daily.      . metoprolol (LOPRESSOR) 50 MG tablet Take 50 mg by mouth 2 (two) times daily.      . mirtazapine (REMERON) 15 MG tablet Take 15 mg by mouth at bedtime.      . nitroGLYCERIN (NITRODUR - DOSED IN MG/24 HR) 0.3 mg/hr Place 1 patch onto the skin daily. Apply in morning, remove at bedtime.      Marland Kitchen oxyCODONE (OXY IR/ROXICODONE) 5 MG immediate release tablet Take 5 mg by mouth every 4 (four) hours as needed. For pain.      . potassium chloride SA (K-DUR,KLOR-CON) 20 MEQ tablet Take 20 mEq by mouth daily.      Marland Kitchen venlafaxine XR (EFFEXOR-XR) 75 MG 24 hr capsule Take 75 mg by mouth daily.      . vitamin C (ASCORBIC ACID) 500 MG tablet Take 500 mg by mouth 2 (two) times daily.      . feeding supplement (PRO-STAT SUGAR FREE 64) LIQD Take 30 mLs by mouth 2 (two) times daily.      . Probiotic Product (PROBIOTIC PO) Take 1 capsule by mouth 2 (two) times daily. 14 day course of therapy; started 11/13/11      . DISCONTD: sertraline (ZOLOFT) 50 MG tablet Take 50 mg by mouth at bedtime.         REVIEW OF SYSTEMS: Please see history of present illness, otherwise negative  PHYSICAL EXAMINATION:   Vital signs are BP 141/65  Pulse 81  Resp 18  Ht 5\' 5"  (1.651 m)  Wt 154 lb (69.854 kg)  BMI 25.63 kg/m2 General: The patient appears their stated age. HEENT:  No gross abnormalities Pulmonary:  Non labored breathing Musculoskeletal: Left heel eschar with surrounding erythema. Dry erythema extending up onto the dorsum  of the foot.gangrene to right toes with Neurologic: No focal weakness or paresthesias are detected, Skin: There are no ulcer or rashes noted. Psychiatric: The patient has normal affect. Cardiovascular: There is a  regular rate and rhythm without significant murmur appreciated.    Assessment:  ischemic left leg  Plan:  I discussed with the patient that our options continued to be palliative measures, comfort care, versus above-knee amputation. At this point she is somewhat hesitant to proceed with amputation which is understandable. I've encouraged her to continue to contemplate this. She will continue to discuss this with her family. In the meantime I would recommend starting her on antibiotics and increasing her pain medicines. I will be happy to proceed with an above-knee amputation once the patient decides to go in the direction  V. Charlena Cross, M.D. [facility] Office: [phone] Pager:  [phone]

## 2012-01-15 ENCOUNTER — Encounter (HOSPITAL_COMMUNITY): Payer: Self-pay

## 2012-01-15 ENCOUNTER — Telehealth: Payer: Self-pay

## 2012-01-15 ENCOUNTER — Emergency Department (HOSPITAL_COMMUNITY): Payer: Medicare Other

## 2012-01-15 ENCOUNTER — Inpatient Hospital Stay (HOSPITAL_COMMUNITY)
Admission: EM | Admit: 2012-01-15 | Discharge: 2012-01-20 | DRG: 239 | Disposition: A | Discharge status: Hospice | Payer: Medicare Other | Attending: Family Medicine | Admitting: Family Medicine

## 2012-01-15 DIAGNOSIS — Z8249 Family history of ischemic heart disease and other diseases of the circulatory system: Secondary | ICD-10-CM

## 2012-01-15 DIAGNOSIS — N3946 Mixed incontinence: Secondary | ICD-10-CM | POA: Diagnosis present

## 2012-01-15 DIAGNOSIS — Z515 Encounter for palliative care: Secondary | ICD-10-CM

## 2012-01-15 DIAGNOSIS — Z794 Long term (current) use of insulin: Secondary | ICD-10-CM

## 2012-01-15 DIAGNOSIS — K219 Gastro-esophageal reflux disease without esophagitis: Secondary | ICD-10-CM | POA: Diagnosis present

## 2012-01-15 DIAGNOSIS — F3289 Other specified depressive episodes: Secondary | ICD-10-CM | POA: Diagnosis present

## 2012-01-15 DIAGNOSIS — Z87891 Personal history of nicotine dependence: Secondary | ICD-10-CM

## 2012-01-15 DIAGNOSIS — L8993 Pressure ulcer of unspecified site, stage 3: Secondary | ICD-10-CM | POA: Diagnosis present

## 2012-01-15 DIAGNOSIS — J449 Chronic obstructive pulmonary disease, unspecified: Secondary | ICD-10-CM | POA: Diagnosis present

## 2012-01-15 DIAGNOSIS — H3530 Unspecified macular degeneration: Secondary | ICD-10-CM | POA: Diagnosis present

## 2012-01-15 DIAGNOSIS — I70309 Unspecified atherosclerosis of unspecified type of bypass graft(s) of the extremities, unspecified extremity: Secondary | ICD-10-CM | POA: Diagnosis present

## 2012-01-15 DIAGNOSIS — F329 Major depressive disorder, single episode, unspecified: Secondary | ICD-10-CM | POA: Diagnosis present

## 2012-01-15 DIAGNOSIS — R7989 Other specified abnormal findings of blood chemistry: Secondary | ICD-10-CM

## 2012-01-15 DIAGNOSIS — R82998 Other abnormal findings in urine: Secondary | ICD-10-CM | POA: Diagnosis present

## 2012-01-15 DIAGNOSIS — L89899 Pressure ulcer of other site, unspecified stage: Secondary | ICD-10-CM | POA: Diagnosis present

## 2012-01-15 DIAGNOSIS — I70209 Unspecified atherosclerosis of native arteries of extremities, unspecified extremity: Secondary | ICD-10-CM

## 2012-01-15 DIAGNOSIS — D649 Anemia, unspecified: Secondary | ICD-10-CM | POA: Diagnosis present

## 2012-01-15 DIAGNOSIS — R410 Disorientation, unspecified: Secondary | ICD-10-CM

## 2012-01-15 DIAGNOSIS — J4489 Other specified chronic obstructive pulmonary disease: Secondary | ICD-10-CM | POA: Diagnosis present

## 2012-01-15 DIAGNOSIS — L8992 Pressure ulcer of unspecified site, stage 2: Secondary | ICD-10-CM | POA: Diagnosis present

## 2012-01-15 DIAGNOSIS — M79609 Pain in unspecified limb: Secondary | ICD-10-CM

## 2012-01-15 DIAGNOSIS — I70269 Atherosclerosis of native arteries of extremities with gangrene, unspecified extremity: Principal | ICD-10-CM | POA: Diagnosis present

## 2012-01-15 DIAGNOSIS — Z66 Do not resuscitate: Secondary | ICD-10-CM | POA: Diagnosis present

## 2012-01-15 DIAGNOSIS — F419 Anxiety disorder, unspecified: Secondary | ICD-10-CM

## 2012-01-15 DIAGNOSIS — I2510 Atherosclerotic heart disease of native coronary artery without angina pectoris: Secondary | ICD-10-CM | POA: Diagnosis present

## 2012-01-15 DIAGNOSIS — L89109 Pressure ulcer of unspecified part of back, unspecified stage: Secondary | ICD-10-CM | POA: Diagnosis present

## 2012-01-15 DIAGNOSIS — E119 Type 2 diabetes mellitus without complications: Secondary | ICD-10-CM | POA: Diagnosis present

## 2012-01-15 DIAGNOSIS — F0390 Unspecified dementia without behavioral disturbance: Secondary | ICD-10-CM

## 2012-01-15 DIAGNOSIS — R4182 Altered mental status, unspecified: Secondary | ICD-10-CM

## 2012-01-15 DIAGNOSIS — M810 Age-related osteoporosis without current pathological fracture: Secondary | ICD-10-CM | POA: Diagnosis present

## 2012-01-15 DIAGNOSIS — K449 Diaphragmatic hernia without obstruction or gangrene: Secondary | ICD-10-CM | POA: Diagnosis present

## 2012-01-15 DIAGNOSIS — Z9089 Acquired absence of other organs: Secondary | ICD-10-CM

## 2012-01-15 DIAGNOSIS — F411 Generalized anxiety disorder: Secondary | ICD-10-CM | POA: Diagnosis present

## 2012-01-15 DIAGNOSIS — M1990 Unspecified osteoarthritis, unspecified site: Secondary | ICD-10-CM | POA: Diagnosis present

## 2012-01-15 DIAGNOSIS — Z96649 Presence of unspecified artificial hip joint: Secondary | ICD-10-CM

## 2012-01-15 DIAGNOSIS — I96 Gangrene, not elsewhere classified: Secondary | ICD-10-CM | POA: Diagnosis present

## 2012-01-15 DIAGNOSIS — I10 Essential (primary) hypertension: Secondary | ICD-10-CM | POA: Diagnosis present

## 2012-01-15 LAB — BASIC METABOLIC PANEL
Calcium: 10.3 mg/dL (ref 8.4–10.5)
GFR calc Af Amer: >90 mL/min (ref >90)
GFR calc non Af Amer: 86 mL/min — ABNORMAL LOW (ref >90)
Glucose, Bld: 115 mg/dL — ABNORMAL HIGH (ref 70–99)
Sodium: 136 mEq/L (ref 135–145)

## 2012-01-15 LAB — URINALYSIS, ROUTINE W REFLEX MICROSCOPIC
Bilirubin Urine: NEGATIVE
Hgb urine dipstick: NEGATIVE
Protein, ur: NEGATIVE mg/dL
Urobilinogen, UA: 1 mg/dL (ref 0.0–1.0)

## 2012-01-15 LAB — GLUCOSE, CAPILLARY

## 2012-01-15 LAB — CBC WITH DIFFERENTIAL/PLATELET
Basophils Relative: 1 % (ref 0–1)
Eosinophils Absolute: 0.1 10*3/uL (ref 0.0–0.7)
Eosinophils Relative: 1 % (ref 0–5)
Lymphs Abs: 1.4 10*3/uL (ref 0.7–4.0)
MCH: 24.8 pg — ABNORMAL LOW (ref 26.0–34.0)
MCHC: 31.7 g/dL (ref 30.0–36.0)
MCV: 78.2 fL (ref 78.0–100.0)
Platelets: 417 10*3/uL — ABNORMAL HIGH (ref 150–400)
RBC: 4.72 MIL/uL (ref 3.87–5.11)
RDW: 17.2 % — ABNORMAL HIGH (ref 11.5–15.5)

## 2012-01-15 LAB — URINE MICROSCOPIC-ADD ON

## 2012-01-15 MED ORDER — RISPERIDONE 0.5 MG PO TABS
0.5000 mg | ORAL_TABLET | Freq: Every day | ORAL | Status: DC
  Administered 2012-01-15 – 2012-01-19 (×5): 0.5 mg via ORAL
  Filled 2012-01-15 (×6): qty 1

## 2012-01-15 MED ORDER — INSULIN ASPART 100 UNIT/ML ~~LOC~~ SOLN
0.0000 [IU] | Freq: Three times a day (TID) | SUBCUTANEOUS | Status: DC
  Administered 2012-01-19 – 2012-01-20 (×3): 2 [IU] via SUBCUTANEOUS

## 2012-01-15 MED ORDER — HYDROCHLOROTHIAZIDE 25 MG PO TABS
25.0000 mg | ORAL_TABLET | Freq: Every day | ORAL | Status: DC
  Filled 2012-01-15: qty 1

## 2012-01-15 MED ORDER — MIRTAZAPINE 15 MG PO TABS
15.0000 mg | ORAL_TABLET | Freq: Every day | ORAL | Status: DC
  Administered 2012-01-15 – 2012-01-19 (×5): 15 mg via ORAL
  Filled 2012-01-15 (×6): qty 1

## 2012-01-15 MED ORDER — HEPARIN SODIUM (PORCINE) 5000 UNIT/ML IJ SOLN
5000.0000 [IU] | Freq: Three times a day (TID) | INTRAMUSCULAR | Status: DC
  Administered 2012-01-15 – 2012-01-17 (×5): 5000 [IU] via SUBCUTANEOUS
  Filled 2012-01-15 (×8): qty 1

## 2012-01-15 MED ORDER — MORPHINE SULFATE ER 15 MG PO TBCR
15.0000 mg | EXTENDED_RELEASE_TABLET | Freq: Two times a day (BID) | ORAL | Status: DC
  Administered 2012-01-15 – 2012-01-18 (×6): 15 mg via ORAL
  Filled 2012-01-15 (×7): qty 1

## 2012-01-15 MED ORDER — DONEPEZIL HCL 10 MG PO TABS
10.0000 mg | ORAL_TABLET | Freq: Every day | ORAL | Status: DC
  Administered 2012-01-15 – 2012-01-19 (×5): 10 mg via ORAL
  Filled 2012-01-15 (×6): qty 1

## 2012-01-15 MED ORDER — DULOXETINE HCL 20 MG PO CPEP
40.0000 mg | ORAL_CAPSULE | Freq: Every day | ORAL | Status: DC
  Administered 2012-01-16 – 2012-01-20 (×4): 40 mg via ORAL
  Filled 2012-01-15 (×6): qty 2

## 2012-01-15 MED ORDER — METOPROLOL TARTRATE 50 MG PO TABS
50.0000 mg | ORAL_TABLET | Freq: Two times a day (BID) | ORAL | Status: DC
  Administered 2012-01-15 – 2012-01-20 (×9): 50 mg via ORAL
  Filled 2012-01-15 (×11): qty 1

## 2012-01-15 NOTE — Consult Note (Signed)
[facility] CONSULT NOTE 01/15/2012 DOB: [id number] MRN : [id number]  CC: We are asked to consult for left dry gangrene chronic foot wouns Referring Physician: Dr. Gwyneth Sprout  ED physician  History of Present Illness: Patient is a 78 y.o. female presenting with altered mental statis changes and known chronic lower extremity pain with dry gangrene. The history is provided by the EMS personnel and the nursing home. The history is limited by the absence of a caregiver.     Past Medical History  Diagnosis Date  . Delirium 12/10/08    hospitalized  . Pelvic fracture 07/01/09    hospitalized  . Coronary artery disease   . Femoral neck fracture   . Anemia   . Hypertension   . Diabetes mellitus   . Dementia   . GI bleed   . Microcytic anemia   . Diverticulosis   . Compression fracture   . Gastric ulcer   . AVM (arteriovenous malformation)     of rectum  . Gastroparesis   . Hiatal hernia 1997  . Esophageal stricture 1997  . Anxiety   . Depression   . GERD (gastroesophageal reflux disease)   . Gout   . Psoriasis   . DJD (degenerative joint disease)   . Vascular disease   . Fall at nursing home 12-10-2011    patient states she fell in her room at nursing home last night, bruising and excoriations noted  on both arms     Past Surgical History  Procedure Date  . Cholecystectomy   . Cataract extraction   . Axillary-femoral bypass graft 08/15/2011    Procedure: BYPASS GRAFT AXILLA-BIFEMORAL;  Surgeon: [name], MD;  Location: [facility] OR;  Service: Vascular;  Laterality: Right;  Embolectomy of Right Femoral Artery with Right  Axilla Femoral Bypass Graft.  . Total hip arthroplasty     left  . Hemorrhoid surgery   . Incisional hernia repair   . Aortobifemoral bypass graft 10/12/1992  . Common iliac thrombectomy 10/12/1992    with right profunda femoris embolectomy and left popliteal artery transfemoral embolectomy`     ROS: [x]  Positive  [ ]   Denies   Patient unable to provide review of systems  General: [ ]  Weight loss, [ ]  Fever, [ ]  chills Neurologic: [ ]  Dizziness, [ ]  Blackouts, [ ]  Seizure [ ]  Stroke, [ ]  "Mini stroke", [ ]  Slurred speech, [ ]  Temporary blindness; [ ]  weakness in arms or legs, [ ]  Hoarseness Cardiac: [ ]  Chest pain/pressure, [ ]  Shortness of breath at rest [ ]  Shortness of breath with exertion, [ ]  Atrial fibrillation or irregular heartbeat Vascular: [ ]  Pain in legs with walking, [ ]  Pain in legs at rest, [ ]  Pain in legs at night,  [ ]  Non-healing ulcer, [ ]  Blood clot in vein/DVT,   Pulmonary: [ ]  Home oxygen, [ ]  Productive cough, [ ]  Coughing up blood, [ ]  Asthma,  [ ]  Wheezing Musculoskeletal:  [ ]  Arthritis, [ ]  Low back pain, [ ]  Joint pain Hematologic: [ ]  Easy Bruising, [ ]  Anemia; [ ]  Hepatitis Gastrointestinal: [ ]  Blood in stool, [ ]  Gastroesophageal Reflux/heartburn, [ ]  Trouble swallowing Urinary: [ ]  chronic Kidney disease, [ ]  on HD - [ ]  MWF or [ ]  TTHS, [ ]  Burning with urination, [ ]  Difficulty urinating Skin: [ ]  Rashes, [ ]  Wounds Psychological: [ ]  Anxiety, [ ]  Depression  Social History History  Substance Use Topics  .  Smoking status: Former Games developer  . Smokeless tobacco: Never Used  . Alcohol Use: No    Family History Family History  Problem Relation Age of Onset  . Heart failure Father     died age 31  . Stroke Mother     died age 52  . Heart attack Father   . Heart disease Sister   . Heart disease Brother   . Colon cancer Neg Hx   . Diabetes      No Known Allergies  No current facility-administered medications for this encounter.   Current Outpatient Prescriptions  Medication Sig Dispense Refill  . albuterol (PROVENTIL) (2.5 MG/3ML) 0.083% nebulizer solution Take 2.5 mg by nebulization every 6 (six) hours as needed. For wheezing/shortness of breath.      Marland Kitchen alum & mag hydroxide-simeth (MYLANTA DOUBLE-STRENGTH) 400-400-40 MG/5ML suspension Take 30 mLs by mouth  every 6 (six) hours as needed. dyspepsia      . donepezil (ARICEPT) 10 MG tablet Take 10 mg by mouth at bedtime.      . DULoxetine (CYMBALTA) 20 MG capsule Take 40 mg by mouth daily.      . ferrous sulfate 325 (65 FE) MG tablet Take 325 mg by mouth 3 (three) times daily with meals.      . hydrochlorothiazide (HYDRODIURIL) 25 MG tablet Take 25 mg by mouth daily.      Marland Kitchen HYDROcodone-acetaminophen (NORCO) 5-325 MG per tablet Take 1 tablet by mouth every 4 (four) hours as needed. For pain      . insulin aspart (NOVOLOG) 100 UNIT/ML injection Inject 0-8 Units into the skin 4 (four) times daily -  before meals and at bedtime. Per sliding scale      . metFORMIN (GLUCOPHAGE) 1000 MG tablet Take 1,000 mg by mouth daily.      . metoprolol (LOPRESSOR) 50 MG tablet Take 50 mg by mouth 2 (two) times daily.      . mirtazapine (REMERON) 15 MG tablet Take 15 mg by mouth at bedtime.      Marland Kitchen morphine (MS CONTIN) 15 MG 12 hr tablet Take 15 mg by mouth 2 (two) times daily.      . Multiple Vitamins-Minerals (CERTAGEN PO) Take by mouth daily.      . nitroGLYCERIN (NITRODUR - DOSED IN MG/24 HR) 0.3 mg/hr Place 1 patch onto the skin daily. Apply in morning, remove at bedtime.      . potassium chloride SA (K-DUR,KLOR-CON) 20 MEQ tablet Take 20 mEq by mouth daily.      . risperiDONE (RISPERDAL) 0.5 MG tablet Take 0.5 mg by mouth at bedtime.      . traMADol (ULTRAM) 50 MG tablet Take 50 mg by mouth every 6 (six) hours as needed.      . vitamin C (ASCORBIC ACID) 500 MG tablet Take 500 mg by mouth 2 (two) times daily.      Marland Kitchen DISCONTD: sertraline (ZOLOFT) 50 MG tablet Take 50 mg by mouth at bedtime.         Imaging: Dg Chest 2 View  01/15/2012  *RADIOLOGY REPORT*  Clinical Data: Intermittent fever.  High blood pressure.  Diabetes.  CHEST - 2 VIEW  Comparison: 08/15/2011 and 07/01/2009.  Findings: Rotation to the right.  Artifact extends over the left medial lung.  No infiltrate, congestive heart failure or pneumothorax.  Mild  loss of height of the thoracic vertebra (T3, T9 and T12)  Hiatal hernia.  Calcified aorta.  Small granulomas left lung base.  IMPRESSION: No  acute abnormality.  Please see above.  Original Report Authenticated By: Fuller Canada, M.D.   Dg Foot Complete Left  01/15/2012  *RADIOLOGY REPORT*  Clinical Data: Left foot pain.  Soft tissue necrosis.  LEFT FOOT - COMPLETE 3+ VIEW  Comparison: None.  Findings: Mild first metatarsal phalangeal joint osteoarthritis. No acute osseous abnormality.  No definite osseous erosion to suggest osteomyelitis.  Calcaneal spurs.  IMPRESSION:  1.  No acute osseous abnormality. 2.  Mild first metatarsal phalangeal joint osteoarthritis.  Original Report Authenticated By: Reyes Ivan, M.D.    Significant Diagnostic Studies: CBC Lab Results  Component Value Date   WBC 9.5 01/15/2012   HGB 11.7* 01/15/2012   HCT 36.9 01/15/2012   MCV 78.2 01/15/2012   PLT 417* 01/15/2012    BMET    Component Value Date/Time   NA 132* 11/22/2011 1040   K 3.3* 11/22/2011 1040   CL 96 11/22/2011 1040   CO2 25 11/22/2011 1040   GLUCOSE 149* 11/22/2011 1040   BUN 9 11/22/2011 1040   CREATININE 0.39* 11/22/2011 1040   CREATININE 0.58 05/24/2011 1046   CALCIUM 9.0 11/22/2011 1040   GFRNONAA >90 11/22/2011 1040   GFRAA >90 11/22/2011 1040    COAG Lab Results  Component Value Date   INR 1.09 08/29/2011   INR 0.98 08/15/2011   INR 1.03 07/01/2009   No results found for this basename: PTT     Physical Examination BP Readings from Last 3 Encounters:  01/15/12 124/48  12/11/11 141/65  11/24/11 148/76   Temp Readings from Last 3 Encounters:  01/15/12 97.6 F (36.4 C) Rectal  11/24/11 97.5 F (36.4 C)   10/26/11 97.8 F (36.6 C) Oral   SpO2 Readings from Last 3 Encounters:  01/15/12 96%  11/24/11 100%  10/26/11 97%   Pulse Readings from Last 3 Encounters:  12/11/11 81  11/24/11 73  10/26/11 76     HENT: able to verbally communicateEyes: Pupils equal Pulmonary:  normal non-labored breathing , without Rales, rhonchi,  wheezing Cardiac: RRR, Abdomen: soft, NT, no masses Skin: Left foot digits 1-3 dry gangrene and heel ulcer with dry gangrene. Vascular Exam/Pulses:  Extremities with ischemic changes, positive Gangrene Musculoskeletal:  muscle wasting or atrophy  Neurologic: Not  Oriented to place person or time.  Alert SENSATION: normal; MOTOR FUNCTION: Pt has has her left leg flexed at the knee and hip and will not extend them activitly Speech is fluent/normal  CT angio 11/21/11:  IMPRESSION:  1. 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  2. Patent right ax-fem bypass graft.  3. Left SFA occlusion with distal popliteal artery  reconstitution and diseased posterior tibial runoff.  4. Long segment right SFA occlusion with distal popliteal artery  reconstitution, diseased anterior tibial and peroneal runoff.   ASSESSMENT/PLAN: Dry gangrene 1-3 digits and heel left lower extremity.  Discussed with Dr. Arbie Cookey and Dr. Myra Gianotti via phone.  We feel that even with re-vascularization, that the likelihood of limb salvage is very low. The best option at this time would be above knee amputation. I will need to discuss this with her family.  Thomasena Edis, Adriann Ballweg MAUREEN PA-C Plan amputation possible on Thursday by Dr. Myra Gianotti

## 2012-01-15 NOTE — H&P (Signed)
Family Medicine Teaching [facility] Admission History and Physical  Patient name: Mackenzie [name] Medical record number: [id number] Date of birth: 1927-07-16 Age: 78 y.o. Gender: female  Primary Care Provider: Carney Living, MD  Chief Complaint: Left foot lesions. AMS History of Present Illness: Mackenzie [name] is a 78 y.o. female Nursing Home with Hospice Care pt that is brought to ED. The physical paperwork listed a new onset of hallucinations, delusions and worsening pain with recent change on her pain medications form Norco to MS contin.  We were called to admit this pt after Vascular Surgery evaluation who recommended AKA of Left LE due to dry gangrenous changes on 1-3 toes and heel.  #1. AMS:  Pleasantly demented pt who is oriented only in person, no hallucinations or delusions during our evaluation. Denies pain or other symptoms, no agitation. Denies burning with urination, pt is incontinent and frequency is hard to assess.  #2. Left Lower extremities lesions: she denies pain, but has antalgic posture. No edema, erythema. No fevers reported by NH or at ED. Pt denies  Nausea or vomiting and she reports herself as feeling well.  Patient Active Problem List  Diagnosis  . THYROID NODULE  . DIABETES MELLITUS II, UNCOMPLICATED  . MACULAR DEGENERATION  . HYPERTENSION, BENIGN SYSTEMIC  . PSORIASIS  . INCONTINENCE, MIXED, URGE/STRESS  . Osteoporosis, senile  . Depression  . Dementia  . Wrist pain  . PVD (peripheral vascular disease)  . GERD (gastroesophageal reflux disease)  . CAD (coronary artery disease)  . Hypokalemia  . Gangrene of foot  . Atherosclerosis of native arteries of the extremities with ulceration   Past Surgical History: Past Surgical History  Procedure Date  . Cholecystectomy   . Cataract extraction   . Axillary-femoral bypass graft 08/15/2011    Procedure: BYPASS GRAFT AXILLA-BIFEMORAL;  Surgeon: [name], MD;  Location: [facility] OR;  Service: Vascular;   Laterality: Right;  Embolectomy of Right Femoral Artery with Right  Axilla Femoral Bypass Graft.  . Total hip arthroplasty     left  . Hemorrhoid surgery   . Incisional hernia repair   . Aortobifemoral bypass graft 10/12/1992  . Common iliac thrombectomy 10/12/1992    with right profunda femoris embolectomy and left popliteal artery transfemoral embolectomy`   Social History: History   Social History  . Marital Status: Married    Spouse Name: Leonette Most    Number of Children: N/A  . Years of Education: 42   Social History Main Topics  . Smoking status: Former Games developer  . Smokeless tobacco: Never Used  . Alcohol Use: No  . Drug Use: No  . Sexually Active: Not Currently    Birth Control/ Protection: Post-menopausal   Other Topics Concern  . None   Social History Narrative   Her husband died in 2011/06/29.   She is now living in [facility].  Daughter, Clemetine Marker, is closest in [place], [place] close to [place], Laymond Purser lives locallySon Robbie  in [place]   Family History: Family History  Problem Relation Age of Onset  . Heart failure Father     died age 24  . Stroke Mother     died age 60  . Heart attack Father   . Heart disease Sister   . Heart disease Brother   . Colon cancer Neg Hx   . Diabetes     Allergies: No Known Allergies No current facility-administered medications for this encounter.    Review Of Systems:  Per HPI  Very hard review of symptoms in this pt but she denies chest pain, palpitations, headache, SOB.   Physical Exam: Filed Vitals:   01/15/12 1930  BP: 134/64  Pulse: 105  Temp: 98.1 F (36.7 C)  Resp: 16   Gen: pleasantly demented pt laying in bed on right lateral decubitus. NAD. Cooperative with examination. HEENT: Moist mucous membranes. Pupils equal and reactive.   CV: Regular rate and rhythm, IV/VI systolic murmur audiuble in all precordium.  PULM: Clear to auscultation bilaterally. No wheezes/rales/rhonchi. ABD:  Soft, non tender, normal bowel sounds EXT: Left LE: 1-3 toes up to proximal phalanx with ischemic necrotic changes. Left heel with similar area  Neuro: Alert and oriented only in person. Doe not impress neurologic focalization but difficult to explore due to pt habitus and mental state. SKIN: sacral area with decubital ulcer stage 2-3, and extended area of erythema.  Left knee with 3-4 cm area of erythema consistent with pression ulcer formation stage 1.   Labs and Imaging: Lab Results  Component Value Date/Time   NA 136 01/15/2012  4:10 PM   K 4.8 01/15/2012  4:10 PM   CL 95* 01/15/2012  4:10 PM   CO2 27 01/15/2012  4:10 PM   BUN 31* 01/15/2012  4:10 PM   CREATININE 0.55 01/15/2012  4:10 PM   CREATININE 0.58 05/24/2011 10:46 AM   GLUCOSE 115* 01/15/2012  4:10 PM   Lab Results  Component Value Date   WBC 9.5 01/15/2012   HGB 11.7* 01/15/2012   HCT 36.9 01/15/2012   MCV 78.2 01/15/2012   PLT 417* 01/15/2012    Assessment and Plan: Mackenzie [name] is a 78 y.o.  female presenting with AMS absent with our evaluation and left LE dry gangrene. Pt is in Hospice Care due to multiple end stage comorbidities.         1. Dry gangrene of LLE: Pt with multiple vascular f/u and intervention due to PAD ( she has history of bypass graft axilla-bifemoral on the right). On today's Vascular Surgery evaluation the recommendation is above knee amputation.  Will try to contact pt's POA [name] [phone]) and discuss recs.   2. AMS: not present at the time of examination. Could be the change on narcotic medication. Pt is in several psychotropics agents and a meticulous review is warrant. Could also be sundowning of her dementia condition. There eis no urinary symptoms, but again this will be hard to assess in an incontinent pt. If pt develop similar state of confusion or  Delirium,  urine culture should be considered. Will continue only with MS contin since pt's pain seems to be controlled.   3. HTN:  will continue ambulatory regimen with lopressor and HCTZ   4. DM: Will cover with SSI and adjust therapy with lantus if needed. Metformin held.  5. CAD: pt will continue ambulatory treatment with Nitro patch start in am and removed before bedtime.  6. Sacral decubital ulcer: wound care consult.   FEN/GI: bedside swallow test and if passes possible CHO modified diet. No IVF needed at this time.  Prophylaxis: Heparin s/c Disposition: pending discussion about surgery with POA.  D. [place]oto Rolene Arbour PGY-2 FMTS Pager [phone]

## 2012-01-15 NOTE — ED Notes (Signed)
Per Nursing Home Staff- Pt has new onset hallucinations and delusions. Pt was c/o pain, recently switched from Norco to MSContin for pain. Staff reports pt has been flushed last temp was 99.6. Last dose of Norco at 1400, MSContin at 1000am today.

## 2012-01-15 NOTE — Telephone Encounter (Signed)
Rec'd call from pt's niece voicing concern about worsening symptoms from left leg.  States pt. has had increased pain and green drainage from an open area left leg.  Phonecall to hospice nurse, Baxter Hire.  Stated that the pain medication was increased on Fri., 7/12, and that the patient has had new onset of confusion, and hallucinations. States pt. has not had fever, but that her face is flushed.  States a tegaderm dressing is in place to an open area left leg, and there is a "green fluid" collected beneathe the tegaderm dsg.   Discussed the above symptoms w/ Dr. Myra Gianotti.  Advised that pt. needs to be sent to the ER to be medically evaluated.   Notified Kristen, Charity fundraiser for Hospice and pt's niece, Larita Fife.  Both parties agree w/ plan. Baxter Hire, RN stated she is at [facility] facility today, and will coordinate getting orders from Dr. Leanord Hawking to send to the ER.

## 2012-01-15 NOTE — ED Provider Notes (Addendum)
History     CSN: [id number]  Arrival date & time 01/15/12  1507   First MD Initiated Contact with Patient 01/15/12 1519      Chief Complaint  Patient presents with  . Wound Infection    here for further eval of left foot wound - area necrosed; per EMS, sent to ED for eval for possible amputation; also reports interm fevers     (Consider location/radiation/quality/duration/timing/severity/associated sxs/prior treatment) HPI Comments: Nursing home had been giving norco but pt having more pain and was started on MS contin.  Pt was having hallucinations and spoke with Dr. Myra Gianotti who requested she come to the ED for eval by the vascular team.  Patient is a 78 y.o. female presenting with lower extremity pain. The history is provided by the EMS personnel and the nursing home. The history is limited by the absence of a caregiver.  Foot Pain This is a chronic problem. Episode onset: at least a month. The problem occurs constantly. The problem has not changed since onset.Associated symptoms comments: Pt has had black toes for awhile and just getting worse.  Also now with an ulcer on the heel.. Nothing aggravates the symptoms. Nothing relieves the symptoms. She has tried nothing for the symptoms.    Past Medical History  Diagnosis Date  . Delirium 12/10/08    hospitalized  . Pelvic fracture 07/01/09    hospitalized  . Coronary artery disease   . Femoral neck fracture   . Anemia   . Hypertension   . Diabetes mellitus   . Dementia   . GI bleed   . Microcytic anemia   . Diverticulosis   . Compression fracture   . Gastric ulcer   . AVM (arteriovenous malformation)     of rectum  . Gastroparesis   . Hiatal hernia 1997  . Esophageal stricture 1997  . Anxiety   . Depression   . GERD (gastroesophageal reflux disease)   . Gout   . Psoriasis   . DJD (degenerative joint disease)   . Vascular disease   . Fall at nursing home 12-10-2011    patient states she fell in her room at nursing  home last night, bruising and excoriations noted  on both arms     Past Surgical History  Procedure Date  . Cholecystectomy   . Cataract extraction   . Axillary-femoral bypass graft 08/15/2011    Procedure: BYPASS GRAFT AXILLA-BIFEMORAL;  Surgeon: [name], MD;  Location: [facility];  Service: Vascular;  Laterality: Right;  Embolectomy of Right Femoral Artery with Right  Axilla Femoral Bypass Graft.  . Total hip arthroplasty     left  . Hemorrhoid surgery   . Incisional hernia repair   . Aortobifemoral bypass graft 10/12/1992  . Common iliac thrombectomy 10/12/1992    with right profunda femoris embolectomy and left popliteal artery transfemoral embolectomy`    Family History  Problem Relation Age of Onset  . Heart failure Father     died age 55  . Stroke Mother     died age 62  . Heart attack Father   . Heart disease Sister   . Heart disease Brother   . Colon cancer Neg Hx   . Diabetes      History  Substance Use Topics  . Smoking status: Former Games developer  . Smokeless tobacco: Never Used  . Alcohol Use: No    OB History    Grav Para Term Preterm Abortions TAB SAB Ect Mult Living  Review of Systems  Unable to perform ROS   Allergies  Review of patient's allergies indicates no known allergies.  Home Medications   Current Outpatient Rx  Name Route Sig Dispense Refill  . ALBUTEROL SULFATE (2.5 MG/3ML) 0.083% IN NEBU Nebulization Take 2.5 mg by nebulization every 6 (six) hours as needed. For wheezing/shortness of breath.    Marland Kitchen ALUM & MAG HYDROXIDE-SIMETH 400-400-40 MG/5ML PO SUSP Oral Take 30 mLs by mouth every 6 (six) hours as needed. dyspepsia    . DONEPEZIL HCL 10 MG PO TABS Oral Take 10 mg by mouth at bedtime.    Marland Kitchen PRO-STAT 64 PO LIQD Oral Take 30 mLs by mouth 2 (two) times daily.    Marland Kitchen FERROUS SULFATE 325 (65 FE) MG PO TABS Oral Take 325 mg by mouth 3 (three) times daily with meals.    Marland Kitchen HYDROCHLOROTHIAZIDE 25 MG PO TABS Oral Take 25 mg by mouth  daily.    . INSULIN ASPART 100 UNIT/ML [place] SOLN Subcutaneous Inject 0-8 Units into the skin 4 (four) times daily -  before meals and at bedtime. Per sliding scale    . METFORMIN HCL 1000 MG PO TABS Oral Take 1,000 mg by mouth daily.    Marland Kitchen METOPROLOL TARTRATE 50 MG PO TABS Oral Take 50 mg by mouth 2 (two) times daily.    Marland Kitchen MIRTAZAPINE 15 MG PO TABS Oral Take 15 mg by mouth at bedtime.    . CERTAGEN PO Oral Take by mouth daily.    Marland Kitchen NITROGLYCERIN 0.3 MG/HR TD PT24 Transdermal Place 1 patch onto the skin daily. Apply in morning, remove at bedtime.    . OXYCODONE HCL 5 MG PO TABS Oral Take 5 mg by mouth every 4 (four) hours as needed. For pain.    Marland Kitchen POTASSIUM CHLORIDE CRYS ER 20 MEQ PO TBCR Oral Take 20 mEq by mouth daily.    Marland Kitchen PROBIOTIC PO Oral Take 1 capsule by mouth 2 (two) times daily. 14 day course of therapy; started 11/13/11    . SIMVASTATIN 40 MG PO TABS Oral Take 40 mg by mouth every evening.    Marland Kitchen TRAMADOL HCL 50 MG PO TABS Oral Take 50 mg by mouth every 6 (six) hours as needed.    . VENLAFAXINE HCL ER 75 MG PO CP24 Oral Take 75 mg by mouth daily.    Marland Kitchen VITAMIN C 500 MG PO TABS Oral Take 500 mg by mouth 2 (two) times daily.      BP 124/48  Temp 98.4 F (36.9 C) (Oral)  Resp 13  SpO2 96%  Physical Exam  Nursing note and vitals reviewed. Constitutional: She appears well-developed and well-nourished. No distress.  HENT:  Head: Normocephalic and atraumatic.  Eyes: EOM are normal. Pupils are equal, round, and reactive to light.  Cardiovascular: Normal rate, regular rhythm, normal heart sounds and intact distal pulses.  Exam reveals no friction rub.   No murmur heard. Pulmonary/Chest: Effort normal and breath sounds normal. She has no wheezes. She has no rales.  Abdominal: Soft. Bowel sounds are normal. She exhibits no distension. There is no tenderness. There is no rebound and no guarding.  Musculoskeletal: Normal range of motion. She exhibits tenderness.       Left foot: She exhibits  tenderness.       Left 1-3rd toes are black and cold.  Mild coolness the the distal plantar foot.  Large necrotic wound to the left heel that is tender with a large eschar.  Erythema  and warmth to the heel.   Neurological: She is alert. No cranial nerve deficit.       Awake and alert  Skin: Skin is warm and dry. No rash noted.  Psychiatric: She has a normal mood and affect. Her behavior is normal.    ED Course  Procedures (including critical care time)   Labs Reviewed  CBC WITH DIFFERENTIAL  BASIC METABOLIC PANEL  URINALYSIS, ROUTINE W REFLEX MICROSCOPIC   Dg Chest 2 View  01/15/2012  *RADIOLOGY REPORT*  Clinical Data: Intermittent fever.  High blood pressure.  Diabetes.  CHEST - 2 VIEW  Comparison: 08/15/2011 and 07/01/2009.  Findings: Rotation to the right.  Artifact extends over the left medial lung.  No infiltrate, congestive heart failure or pneumothorax.  Mild loss of height of the thoracic vertebra (T3, T9 and T12)  Hiatal hernia.  Calcified aorta.  Small granulomas left lung base.  IMPRESSION: No acute abnormality.  Please see above.  Original Report Authenticated By: Fuller Canada, M.D.   Dg Foot Complete Left  01/15/2012  *RADIOLOGY REPORT*  Clinical Data: Left foot pain.  Soft tissue necrosis.  LEFT FOOT - COMPLETE 3+ VIEW  Comparison: None.  Findings: Mild first metatarsal phalangeal joint osteoarthritis. No acute osseous abnormality.  No definite osseous erosion to suggest osteomyelitis.  Calcaneal spurs.  IMPRESSION:  1.  No acute osseous abnormality. 2.  Mild first metatarsal phalangeal joint osteoarthritis.  Original Report Authenticated By: Reyes Ivan, M.D.     1. Arterial occlusion, lower extremity   2. Altered mental status       MDM   Pt with signs of necrotic foot with demarcation.  Toes 1-3 are dead.  Also pt has a decubitus ulcer of the left heel.  Palpable pulse in the DP.  Per nursing home records pt has been febrile intermittently.  Pt is c/o of  pain in the foot.  Per notes they have been waiting for the toes to demarcate and treating with norco.  Dr. Myra Gianotti was contacted by nursing home who suggested pt come here for evaluation.  4:29 PM Dr. Arbie Cookey to come and evaluated the pt. Vascular evaluated the pt and states she was sent for AMS.  Feel the foot is stable but can amputate on Thursday.     Gwyneth Sprout, MD 01/15/12 1547  Gwyneth Sprout, MD 01/15/12 1629  Gwyneth Sprout, MD 01/15/12 1638  Gwyneth Sprout, MD 01/15/12 1638  Gwyneth Sprout, MD 01/15/12 7846  Gwyneth Sprout, MD 01/15/12 9629

## 2012-01-16 DIAGNOSIS — I739 Peripheral vascular disease, unspecified: Secondary | ICD-10-CM

## 2012-01-16 DIAGNOSIS — F0390 Unspecified dementia without behavioral disturbance: Secondary | ICD-10-CM

## 2012-01-16 LAB — GLUCOSE, CAPILLARY
Glucose-Capillary: 92 mg/dL (ref 70–99)
Glucose-Capillary: 94 mg/dL (ref 70–99)
Glucose-Capillary: 92 mg/dL (ref 70–99)
Glucose-Capillary: 92 mg/dL (ref 70–99)

## 2012-01-16 LAB — CBC
HCT: 35.7 % — ABNORMAL LOW (ref 36.0–46.0)
MCV: 78.8 fL (ref 78.0–100.0)
RDW: 17.4 % — ABNORMAL HIGH (ref 11.5–15.5)
WBC: 7.2 10*3/uL (ref 4.0–10.5)

## 2012-01-16 LAB — BASIC METABOLIC PANEL
BUN: 29 mg/dL — ABNORMAL HIGH (ref 6–23)
CO2: 29 mEq/L (ref 19–32)
Chloride: 96 mEq/L (ref 96–112)
Creatinine, Ser: 0.51 mg/dL (ref 0.50–1.10)

## 2012-01-16 MED ORDER — HYDROCHLOROTHIAZIDE 25 MG PO TABS
25.0000 mg | ORAL_TABLET | Freq: Every day | ORAL | Status: DC
  Administered 2012-01-16 – 2012-01-20 (×4): 25 mg via ORAL
  Filled 2012-01-16 (×5): qty 1

## 2012-01-16 MED ORDER — TRAMADOL HCL 50 MG PO TABS
50.0000 mg | ORAL_TABLET | Freq: Four times a day (QID) | ORAL | Status: DC | PRN
  Administered 2012-01-17 (×2): 50 mg via ORAL
  Filled 2012-01-16 (×2): qty 1

## 2012-01-16 MED ORDER — OXYCODONE HCL 5 MG PO TABS
5.0000 mg | ORAL_TABLET | Freq: Once | ORAL | Status: AC
  Administered 2012-01-16: 5 mg via ORAL
  Filled 2012-01-16: qty 1

## 2012-01-16 MED ORDER — NITROGLYCERIN 0.3 MG/HR TD PT24
0.3000 mg | MEDICATED_PATCH | Freq: Every day | TRANSDERMAL | Status: DC
  Administered 2012-01-16 – 2012-01-20 (×4): 0.3 mg via TRANSDERMAL
  Filled 2012-01-16 (×6): qty 1

## 2012-01-16 MED ORDER — NITROGLYCERIN 0.3 MG/HR TD PT24: 0.3000 mg | MEDICATED_PATCH | Freq: Every day | TRANSDERMAL | Status: DC

## 2012-01-16 MED ORDER — SODIUM CHLORIDE 0.9 % IV SOLN
INTRAVENOUS | Status: DC
  Administered 2012-01-16: via INTRAVENOUS
  Administered 2012-01-16: 1000 mL via INTRAVENOUS
  Administered 2012-01-17: 75 mL/h via INTRAVENOUS
  Administered 2012-01-19 (×2): via INTRAVENOUS

## 2012-01-16 MED ORDER — DEXTROSE 5 % IV SOLN: 1.5000 g | INTRAVENOUS | Status: DC

## 2012-01-16 NOTE — Progress Notes (Signed)
Patient arrived in room 6732 from ED via stretcher.  Alert with some disorientation as to situation, incontinent of bowel and bladder.  Multiple wound site to LLE documented. Left leg also with some contracture.  Patient was transferred to hospital bed.  Bed in lowest position, patient oriented to surroundings and call button, bed alarm turned on.  Patient has no complaints at these time.  Denies pain/discomfort and in no apparent distress.  Will continue to monitor.

## 2012-01-16 NOTE — Progress Notes (Signed)
Discussed in rounds.  Seen today by attending, Dr. Jennette Kettle.  We need to discuss with power of attorney and determine capacity of patient.  We face an important decision about surgical intervention or comfort care approach.  We want to be sure we ask the right person to make that decision.

## 2012-01-16 NOTE — Progress Notes (Signed)
Pt lying in bed with tears streaming down her face.  Left knee bent and pulled up towards chest.  Pt reported severe pain.  She was able to rate pain as a 10 out of 10 once numbers explained to her.  Assigned nurse notified.  No PRN orders.  She is to contact MD for new orders.   Reviewed chart.  Last VS were 97.4, 105, 17, 131/71.  Abnormal labs include Hgb-11.2, Hct-35.4, Plt-422, BUN-29.  Albumin and protein levels not available.  Pt has stage two area on Sacrum.  Also an open area on lateral malleolus. Dry gangrene of 1-3 left toes and heel.  Surgical consult done.  AKA of the left leg recommended.  PCG to be contacted to discuss possible surgery.   Pt was sent to the ER per [facility]G request for a vascular consult.  Per report the pt had developed an increase in left leg pain over the past week.  New purulent drainage from lateral wound on Left lower leg. Left foot necrotic toes (1-3) and heel has been on going. New onset of hallucination and intermittent fevers.  Pt currently under hospice services for peripheral vascular disease.  Given that the pt has been dx with dry gangrene and occlusions in left leg this appears to be a hospice related admission.  Please contact [facility] with any Pt movements and or discharge concerns at [phone].  Pam PetersonRN, [facility]

## 2012-01-16 NOTE — Evaluation (Signed)
Clinical/Bedside Swallow Evaluation Patient Details  Name: Mackenzie [name] MRN: [id number] Date of Birth: 29-Aug-1927  Today's Date: 01/16/2012 Time: 7829-5621 SLP Time Calculation (min): 18 min  Past Medical History:  Past Medical History  Diagnosis Date  . Delirium 12/10/08    hospitalized  . Pelvic fracture 07/01/09    hospitalized  . Coronary artery disease   . Femoral neck fracture   . Anemia   . Hypertension   . Diabetes mellitus   . Dementia   . GI bleed   . Microcytic anemia   . Diverticulosis   . Compression fracture   . Gastric ulcer   . AVM (arteriovenous malformation)     of rectum  . Gastroparesis   . Hiatal hernia 1997  . Esophageal stricture 1997  . Anxiety   . Depression   . GERD (gastroesophageal reflux disease)   . Gout   . Psoriasis   . DJD (degenerative joint disease)   . Vascular disease   . Fall at nursing home 12-10-2011    patient states she fell in her room at nursing home last night, bruising and excoriations noted  on both arms    Past Surgical History:  Past Surgical History  Procedure Date  . Cholecystectomy   . Cataract extraction   . Axillary-femoral bypass graft 08/15/2011    Procedure: BYPASS GRAFT AXILLA-BIFEMORAL;  Surgeon: [name], MD;  Location: [facility] OR;  Service: Vascular;  Laterality: Right;  Embolectomy of Right Femoral Artery with Right  Axilla Femoral Bypass Graft.  . Total hip arthroplasty     left  . Hemorrhoid surgery   . Incisional hernia repair   . Aortobifemoral bypass graft 10/12/1992  . Common iliac thrombectomy 10/12/1992    with right profunda femoris embolectomy and left popliteal artery transfemoral embolectomy`   HPI:  Mackenzie [name] is a 78 y.o. female Nursing Home with Hospice Care pt that is brought to ED. The physical paperwork listed a new onset of hallucinations, delusions and worsening pain with recent change on her pain medications form Norco to MS contin.  We were called to admit this pt after  Vascular Surgery evaluation who recommended AKA of Left LE due to dry gangrenous changes on 1-3 toes and heel.     Assessment / Plan / Recommendation Clinical Impression  Pt with no overt s/s of aspiration. Pt demonstrated ability to masticate solids though she did admit to chewing her food for an overlong perior dof time, mild right lateral residuals observed. Pt transited when given extra time. Feel pt is able to continue regular diet with thin liquids safely. No diet modification or SLP treatment required at this time.     Aspiration Risk  None    Diet Recommendation Regular;Thin liquid   Liquid Administration via: Cup;Straw Medication Administration: Whole meds with liquid Supervision: Patient able to self feed;Intermittent supervision to cue for compensatory strategies Compensations: Slow rate;Small sips/bites Postural Changes and/or Swallow Maneuvers: Seated upright 90 degrees    Other  Recommendations Oral Care Recommendations: Oral care BID   Follow Up Recommendations  None    Frequency and Duration        Pertinent Vitals/Pain NA    SLP Swallow Goals     Swallow Study Prior Functional Status       General HPI: Mackenzie [name] is a 78 y.o. female Nursing Home with Hospice Care pt that is brought to ED. The physical paperwork listed a new onset of hallucinations, delusions and worsening  pain with recent change on her pain medications form Norco to MS contin.  We were called to admit this pt after Vascular Surgery evaluation who recommended AKA of Left LE due to dry gangrenous changes on 1-3 toes and heel.   Type of Study: Bedside swallow evaluation Diet Prior to this Study: Regular;Thin liquids Temperature Spikes Noted: No Respiratory Status: Room air Behavior/Cognition: Alert;Cooperative;Pleasant mood;Confused Oral Cavity - Dentition: Poor condition;Missing dentition Self-Feeding Abilities: Able to feed self Patient Positioning: Upright in bed Baseline Vocal  Quality: Clear Volitional Cough: Strong Volitional Swallow: Able to elicit    Oral/Motor/Sensory Function Overall Oral Motor/Sensory Function: Appears within functional limits for tasks assessed   Ice Chips Ice chips: Within functional limits Presentation: Spoon   Thin Liquid Thin Liquid: Within functional limits Presentation: Cup;Straw;Self Fed    Nectar Thick Nectar Thick Liquid: Not tested   Honey Thick Honey Thick Liquid: Not tested   Puree Puree: Within functional limits   Solid Solid: Impaired Presentation: Self Fed Oral Phase Impairments: Impaired anterior to posterior transit Oral Phase Functional Implications: Right lateral sulci pocketing;Oral residue    Lasonia Casino, Riley Nearing 01/16/2012,1:37 PM

## 2012-01-16 NOTE — Progress Notes (Signed)
Room 6732, Mackenzie [name], [facility], [place] Note  Hospice SW met with pt to assess status and plan of care..  Pt's niece, Laymond Purser, is her HCPOA and is reachable by her cell [phone] or home [phone].  Niece is employed by the [employer] and is working today.  SW spoke to niece by telephone today and niece reported she will not come to see pt due to her phobia of stairs and elevators due to fear of heights.  Niece reported she spoke with Dr. Leanord Hawking and is in agreement with physician recommendation to have foot amputated.  Niece's main goal is to keep pt out of pain.  Pt confused at time of visit and did not know why she was hospitalized but with conversation, asked if they were talking about amputation, which SW confirmed.  Pt stated she did not want amputation "unless I would lose my life over it" but stated that the plan to talk to niece was a  "good plan".   Pt is resident of SNF at [facility].  Plan would include pt returning to [facility] when pt is discharged from hospital.  Burna Cash, MSW, LCSW [facility])  [phone] ext [phone]

## 2012-01-16 NOTE — Progress Notes (Signed)
PGY-1 Daily Progress Note Family Medicine Teaching Service [place] R. Gwendy Boeder, DO Service Pager: [phone]   Subjective: Pt seen and examined at bedside.  Overnight, no acute events reported.  She is currently laying in bed and states she is in [facility] in between [place] and [place]   Objective:  VITALS Temp:  [97.4 F (36.3 C)-98.4 F (36.9 C)] 97.4 F (36.3 C) (07/16 0934) Pulse Rate:  [98-108] 105  (07/16 0934) Resp:  [13-18] 17  (07/16 0934) BP: (114-134)/(48-71) 131/71 mmHg (07/16 0934) SpO2:  [91 %-96 %] 91 % (07/16 0934) Weight:  [138 lb 12.8 oz (62.959 kg)] 138 lb 12.8 oz (62.959 kg) (07/15 2157)  In/Out No intake or output data in the 24 hours ending 01/16/12 0853  Physical Exam: Gen:  NAD HEENT: slightly dry CV: Regular rate and rhythm, no murmurs rubs or gallops PULM: clear to auscultation bilaterally. No wheezes/rales/rhonchi ABD: soft/nontender/nondistended/normal bowel sounds EXT: No edema, + 1, 2 , 3 digitis on L foot gangrene.  + calcaneal and lateral mallelous stage 2 ulcers.  + Sacral Decubitus ulcer Neuro: Alert and oriented x3.  She knows she is in [facility], in between [place], and knows today is Tuesday.  Does believe it's spring but states month is June  MEDS Scheduled Meds:    . cefUROXime (ZINACEF)  IV  1.5 g Intravenous On Call to OR  . donepezil  10 mg Oral QHS  . DULoxetine  40 mg Oral Daily  . heparin  5,000 Units Subcutaneous Q8H  . hydrochlorothiazide  25 mg Oral Daily  . insulin aspart  0-9 Units Subcutaneous TID WC  . metoprolol  50 mg Oral BID  . mirtazapine  15 mg Oral QHS  . morphine  15 mg Oral BID  . nitroGLYCERIN  0.3 mg Transdermal Daily  . oxyCODONE  5 mg Oral Once  . risperiDONE  0.5 mg Oral QHS  . DISCONTD: hydrochlorothiazide  25 mg Oral Daily  . DISCONTD: nitroGLYCERIN  0.3 mg Transdermal Daily   Continuous Infusions:   . sodium chloride     PRN Meds:.  Labs and imaging:    CBC  Lab 01/16/12 0540 01/15/12 1610  WBC 7.2 9.5  HGB 11.2* 11.7*  HCT 35.7* 36.9  PLT 422* 417*   BMET/CMET  Lab 01/16/12 0540 01/15/12 1610  NA 138 136  K 3.8 4.8  CL 96 95*  CO2 29 27  BUN 29* 31*  CREATININE 0.51 0.55  CALCIUM 10.4 10.3  PROT -- --  BILITOT -- --  ALKPHOS -- --  ALT -- --  AST -- --  GLUCOSE 107* 115*   Results for orders placed during the hospital encounter of 01/15/12 (from the past 24 hour(s))  CBC WITH DIFFERENTIAL     Status: Abnormal   Collection Time   01/15/12  4:10 PM      Component Value Range   WBC 9.5  4.0 - 10.5 K/uL   RBC 4.72  3.87 - 5.11 MIL/uL   Hemoglobin 11.7 (*) 12.0 - 15.0 g/dL   HCT 91.4  78.2 - 95.6 %   MCV 78.2  78.0 - 100.0 fL   MCH 24.8 (*) 26.0 - 34.0 pg   MCHC 31.7  30.0 - 36.0 g/dL   RDW 21.3 (*) 08.6 - 57.8 %   Platelets 417 (*) 150 - 400 K/uL   Neutrophils Relative 73  43 - 77 %   Neutro Abs 7.0  1.7 - 7.7 K/uL  Lymphocytes Relative 15  12 - 46 %   Lymphs Abs 1.4  0.7 - 4.0 K/uL   Monocytes Relative 11  3 - 12 %   Monocytes Absolute 1.1 (*) 0.1 - 1.0 K/uL   Eosinophils Relative 1  0 - 5 %   Eosinophils Absolute 0.1  0.0 - 0.7 K/uL   Basophils Relative 1  0 - 1 %   Basophils Absolute 0.1  0.0 - 0.1 K/uL  BASIC METABOLIC PANEL     Status: Abnormal   Collection Time   01/15/12  4:10 PM      Component Value Range   Sodium 136  135 - 145 mEq/L   Potassium 4.8  3.5 - 5.1 mEq/L   Chloride 95 (*) 96 - 112 mEq/L   CO2 27  19 - 32 mEq/L   Glucose, Bld 115 (*) 70 - 99 mg/dL   BUN 31 (*) 6 - 23 mg/dL   Creatinine, Ser 9.60  0.50 - 1.10 mg/dL   Calcium 45.4  8.4 - 09.8 mg/dL   GFR calc non Af Amer 86 (*) >90 mL/min   GFR calc Af Amer >90  >90 mL/min  URINALYSIS, ROUTINE W REFLEX MICROSCOPIC     Status: Abnormal   Collection Time   01/15/12  4:43 PM      Component Value Range   Color, Urine YELLOW  YELLOW   APPearance HAZY (*) CLEAR   Specific Gravity, Urine 1.023  1.005 - 1.030   pH 5.0  5.0 - 8.0    Glucose, UA NEGATIVE  NEGATIVE mg/dL   Hgb urine dipstick NEGATIVE  NEGATIVE   Bilirubin Urine NEGATIVE  NEGATIVE   Ketones, ur 15 (*) NEGATIVE mg/dL   Protein, ur NEGATIVE  NEGATIVE mg/dL   Urobilinogen, UA 1.0  0.0 - 1.0 mg/dL   Nitrite NEGATIVE  NEGATIVE   Leukocytes, UA SMALL (*) NEGATIVE  URINE MICROSCOPIC-ADD ON     Status: Abnormal   Collection Time   01/15/12  4:43 PM      Component Value Range   Squamous Epithelial / LPF FEW (*) RARE   WBC, UA 3-6  <3 WBC/hpf   Bacteria, UA RARE  RARE   Casts GRANULAR CAST (*) NEGATIVE   Crystals CA OXALATE CRYSTALS (*) NEGATIVE  GLUCOSE, CAPILLARY     Status: Abnormal   Collection Time   01/15/12  9:54 PM      Component Value Range   Glucose-Capillary 119 (*) 70 - 99 mg/dL   Comment 1 Documented in Chart     Comment 2 Notify RN    MRSA PCR SCREENING     Status: Normal   Collection Time   01/15/12 11:17 PM      Component Value Range   MRSA by PCR NEGATIVE  NEGATIVE  BASIC METABOLIC PANEL     Status: Abnormal   Collection Time   01/16/12  5:40 AM      Component Value Range   Sodium 138  135 - 145 mEq/L   Potassium 3.8  3.5 - 5.1 mEq/L   Chloride 96  96 - 112 mEq/L   CO2 29  19 - 32 mEq/L   Glucose, Bld 107 (*) 70 - 99 mg/dL   BUN 29 (*) 6 - 23 mg/dL   Creatinine, Ser 1.19  0.50 - 1.10 mg/dL   Calcium 14.7  8.4 - 82.9 mg/dL   GFR calc non Af Amer 88 (*) >90 mL/min   GFR calc Af Amer >90  >  90 mL/min  CBC     Status: Abnormal   Collection Time   01/16/12  5:40 AM      Component Value Range   WBC 7.2  4.0 - 10.5 K/uL   RBC 4.53  3.87 - 5.11 MIL/uL   Hemoglobin 11.2 (*) 12.0 - 15.0 g/dL   HCT 47.8 (*) 29.5 - 62.1 %   MCV 78.8  78.0 - 100.0 fL   MCH 24.7 (*) 26.0 - 34.0 pg   MCHC 31.4  30.0 - 36.0 g/dL   RDW 30.8 (*) 65.7 - 84.6 %   Platelets 422 (*) 150 - 400 K/uL  GLUCOSE, CAPILLARY     Status: Normal   Collection Time   01/16/12  7:45 AM      Component Value Range   Glucose-Capillary 92  70 - 99 mg/dL   Dg Chest 2  View  9/62/9528  IMPRESSION: No acute abnormality.  Please see above.  Original Report Authenticated By: Fuller Canada, M.D.   Ct Head Wo Contrast  01/15/2012   IMPRESSION: 1.  No acute intracranial abnormalities. 2.  Cerebral and cerebellar atrophy with mild chronic microvascular ischemic changes in the cerebral white matter and old infarction in the left sub insular region, similar to prior examination 02/01/2011.  Original Report Authenticated By: Florencia Reasons, M.D.   Dg Foot Complete Left  01/15/2012   IMPRESSION:  1.  No acute osseous abnormality. 2.  Mild first metatarsal phalangeal joint osteoarthritis.  Original Report Authenticated By: Reyes Ivan, M.D.        Assessment  JORDAIN RADIN is a 78 y.o.  female presenting with AMS absent with our evaluation and left LE dry gangrene. Pt is in Hospice Care due to multiple end stage comorbidities.       Plan     1. Dry gangrene of LLE: Pt with multiple vascular f/u and intervention due to PAD ( she has history of bypass graft axilla-bifemoral on the right). On today's Vascular Surgery evaluation the recommendation is above knee amputation.   Contacted POA [name] [phone]) to discuss recommendations of Vascular Surgery. No answer and left a message to call back.  Will attempt to call later as well as pt niece did talk to nurse who will page if she calls back or comes.   2. AMS: not present at the time of examination. Could be the change on narcotic medication. Pt is in several psychotropics agents and a meticulous review is warrant. Could also be sundowning of her dementia condition. There is no urinary symptoms, but again this will be hard to assess in an incontinent pt. If pt develop similar state of confusion or  Delirium,  urine culture should be considered. Will continue only with MS contin since pt's pain seems to be controlled.  Is alert to place but less so to time.  Thinks it's June/spring but knows  exactly where she is at and who she is.  3.Pre-renal azotemia - Elevated solitary BUN at 29.  Most likely stemming from dehydration as not started on fluids upon admission.  Could also be contributing to her AMS.  Will start on carb modified diet and NS @ 66ml/hr.  Will need to change her to NPO starting 7/18 at midnight for her surgery.   4. HTN: will continue ambulatory regimen with lopressor and HCTZ    5. DM: Will cover with SSI and adjust therapy with lantus if needed. Metformin held.  Started on carb  modified diet   6. CAD: pt will continue ambulatory treatment with Nitro patch start in am and removed before bedtime.   7. Sacral decubital ulcer: wound care consult.    8. Calcaneal Ulcer: At stage 2 as well, wound care consult to take care of this aspect too.    FEN/GI: Swallow eval for diet. Ordered this AM.  NS @ 75 mL/ hr and on carb modified diet after recommendations of swallow eval. Prophylaxis:  Heparin SQ Disposition: Pt on hospice.  Will go for AKA per VS and back to nursing home for hospice care when stable.  Twana First Eliot Bencivenga DO, FMTS PGY-1

## 2012-01-16 NOTE — H&P (Signed)
FMTS Attending Admission Note: Mackenzie Levy MD [phone] pager office [phone] I  have seen and examined this patient, reviewed their chart. I have discussed this patient with the resident. I agree with the resident's findings, assessment and care plan. I examined Mackenzie [name] this morning and she was oriented to person only. She did tell me she did not want an amputation. Getting her HCPOA and SW involved will be essential. At the time of admission last night, no family was available nor was anyone from her living facility.

## 2012-01-16 NOTE — Care Management Note (Signed)
    Page 1 of 1   01/16/2012     3:31:12 PM   CARE MANAGEMENT NOTE 01/16/2012  Patient:  Mackenzie [name], Mackenzie [name]   Account Number:  [id number]  Date Initiated:  01/16/2012  Documentation initiated by:  Jim Like  Subjective/Objective Assessment:   Pt is 78 yr old admitted with altered mental status and dry gangrene     Action/Plan:   Continue to follow for CM/discharge planning needs   Anticipated DC Date:  01/21/2012   Anticipated DC Plan:  SKILLED NURSING FACILITY      DC Planning Services  CM consult      Choice offered to / List presented to:             Status of service:  In process, will continue to follow Medicare Important Message given?   (If response is "NO", the following Medicare IM given date fields will be blank) Date Medicare IM given:   Date Additional Medicare IM given:    Discharge Disposition:    Per UR Regulation:  Reviewed for med. necessity/level of care/duration of stay  If discussed at Long Length of Stay Meetings, dates discussed:    Comments:  01/16/12 15:25 CSW has already seen patient.  Pt's daughter has healthcare POA which per MD is irrelevant is pt is deemed capable of making medical decisions.  Pt will return to [facility] when medically stable. Jim Like RN CCM MHA.

## 2012-01-16 NOTE — Clinical Social Work Psychosocial (Signed)
     Clinical Social Work Department BRIEF PSYCHOSOCIAL ASSESSMENT 01/16/2012  Patient:  Mackenzie [name], Mackenzie [name]     Account Number:  [id number]     Admit date:  01/15/2012  Clinical Social Worker:  Lourdes Sledge  Date/Time:  01/16/2012 02:05 PM  Referred by:  Physician  Date Referred:  01/16/2012 Referred for  SNF Placement   Other Referral:   Interview type:  Family Other interview type:   CSW spoke with pt HCPOA Mackenzie [name] - cell [phone] or home [phone]    PSYCHOSOCIAL DATA Living Status:  FACILITY Admitted from facility:  [facility] Level of care:  Skilled Nursing Facility Primary support name:  Mackenzie [name] Primary support relationship to patient:  FAMILY Degree of support available:   Pt admitted from [facility] however pt niece is pt HCPOA and actively involved in pt care.    CURRENT CONCERNS Current Concerns  Post-Acute Placement   Other Concerns:    SOCIAL WORK ASSESSMENT / PLAN CSW received referral as pt was admitted from [facility].    CSW made aware that pt presents with confusion. CSW contacted pt HCPOA Mackenzie [name] regarding discharge planning. Pt HCPOA stated that the plan will be for pt to return to [facility] SNF with Hospice following at discharge. Pt is a LT resident at the facility and is currently being followed by [facility]. Pt HCPOA had no questions or concerns and appreciated CSW call.   Assessment/plan status:  Psychosocial Support/Ongoing Assessment of Needs Other assessment/ plan:   Information/referral to community resources:   Pt is a LT resident of [place] and is being followed by a hospice agency. The pt is currently setup with all necessary resources. None needed at this time.    PATIENTS/FAMILYS RESPONSE TO PLAN OF CARE: Pt presents with dementia/AMS and unable to give detailed information. CSW received information from pt HCPO [employer]. Plan remains for pt to return to [facility] at [employer]. CSW  to follow and facilitate with dc.

## 2012-01-17 DIAGNOSIS — F411 Generalized anxiety disorder: Secondary | ICD-10-CM

## 2012-01-17 DIAGNOSIS — F0390 Unspecified dementia without behavioral disturbance: Secondary | ICD-10-CM

## 2012-01-17 DIAGNOSIS — R404 Transient alteration of awareness: Secondary | ICD-10-CM

## 2012-01-17 DIAGNOSIS — I96 Gangrene, not elsewhere classified: Secondary | ICD-10-CM | POA: Diagnosis present

## 2012-01-17 DIAGNOSIS — M79609 Pain in unspecified limb: Secondary | ICD-10-CM

## 2012-01-17 LAB — GLUCOSE, CAPILLARY
Glucose-Capillary: 92 mg/dL (ref 70–99)
Glucose-Capillary: 103 mg/dL — ABNORMAL HIGH (ref 70–99)
Glucose-Capillary: 70 mg/dL (ref 70–99)

## 2012-01-17 MED ORDER — ENOXAPARIN SODIUM 40 MG/0.4ML ~~LOC~~ SOLN
40.0000 mg | SUBCUTANEOUS | Status: DC
  Administered 2012-01-19 – 2012-01-20 (×2): 40 mg via SUBCUTANEOUS
  Filled 2012-01-17 (×4): qty 0.4

## 2012-01-17 NOTE — Progress Notes (Signed)
PGY-1 Daily Progress Note Family Medicine Teaching Service [place] R. Mackenzie Wilkie, DO Service Pager: [phone]   Subjective: Pt seen and examined at bedside.  Overnight, pt did have pain in her LLE and c/o pain this AM.  When examining her, she was distressed and crying feeling that she was trying to be killed.  Hard to ascertain more information from her at this point as she would not stop crying.    Objective:  VITALS Temp:  [97.7 F (36.5 C)-98.3 F (36.8 C)] 97.7 F (36.5 C) (07/17 0529) Pulse Rate:  [87-95] 91  (07/17 0529) Resp:  [16-18] 18  (07/17 0529) BP: (109-133)/(60-71) 115/71 mmHg (07/17 0529) SpO2:  [94 %-100 %] 98 % (07/17 0529) Weight:  [142 lb 6.7 oz (64.6 kg)] 142 lb 6.7 oz (64.6 kg) (07/16 2001)  In/Out  Intake/Output Summary (Last 24 hours) at 01/17/12 0944 Last data filed at 01/16/12 1458  Gross per 24 hour  Intake    390 ml  Output      0 ml  Net    390 ml    Physical Exam: Gen:  Obvious distress, crying heavily HEENT: slightly dry CV: Regular rate and rhythm, no murmurs rubs or gallops PULM: clear to auscultation bilaterally. No wheezes/rales/rhonchi ABD: soft/nontender/nondistended/normal bowel sounds EXT: No edema, + 1, 2 , 3 digitis on L foot gangrene.  + calcaneal and lateral mallelous stage 2 ulcers.  + Sacral Decubitus ulcer Neuro: Alert   MEDS Scheduled Meds:    . cefUROXime (ZINACEF)  IV  1.5 g Intravenous On Call to OR  . donepezil  10 mg Oral QHS  . DULoxetine  40 mg Oral Daily  . heparin  5,000 Units Subcutaneous Q8H  . hydrochlorothiazide  25 mg Oral Daily  . insulin aspart  0-9 Units Subcutaneous TID WC  . metoprolol  50 mg Oral BID  . mirtazapine  15 mg Oral QHS  . morphine  15 mg Oral BID  . nitroGLYCERIN  0.3 mg Transdermal Daily  . risperiDONE  0.5 mg Oral QHS  . DISCONTD: hydrochlorothiazide  25 mg Oral Daily   Continuous Infusions:    . sodium chloride 75 mL/hr at 01/16/12 2332   PRN Meds:.traMADol  Labs and imaging:    CBC  Lab 01/16/12 0540 01/15/12 1610  WBC 7.2 9.5  HGB 11.2* 11.7*  HCT 35.7* 36.9  PLT 422* 417*   BMET/CMET  Lab 01/16/12 0540 01/15/12 1610  NA 138 136  K 3.8 4.8  CL 96 95*  CO2 29 27  BUN 29* 31*  CREATININE 0.51 0.55  CALCIUM 10.4 10.3  PROT -- --  BILITOT -- --  ALKPHOS -- --  ALT -- --  AST -- --  GLUCOSE 107* 115*   Results for orders placed during the hospital encounter of 01/15/12 (from the past 24 hour(s))  GLUCOSE, CAPILLARY     Status: Normal   Collection Time   01/16/12 11:48 AM      Component Value Range   Glucose-Capillary 94  70 - 99 mg/dL  GLUCOSE, CAPILLARY     Status: Normal   Collection Time   01/16/12  4:30 PM      Component Value Range   Glucose-Capillary 92  70 - 99 mg/dL  GLUCOSE, CAPILLARY     Status: Normal   Collection Time   01/16/12  9:43 PM      Component Value Range   Glucose-Capillary 92  70 - 99 mg/dL  GLUCOSE, CAPILLARY  Status: Normal   Collection Time   01/17/12  7:55 AM      Component Value Range   Glucose-Capillary 92  70 - 99 mg/dL   Dg Chest 2 View  4/54/0981  IMPRESSION: No acute abnormality.  Please see above.  Original Report Authenticated By: Mackenzie [name], M.D.   Ct Head Wo Contrast  01/15/2012   IMPRESSION: 1.  No acute intracranial abnormalities. 2.  Cerebral and cerebellar atrophy with mild chronic microvascular ischemic changes in the cerebral white matter and old infarction in the left sub insular region, similar to prior examination 02/01/2011.  Original Report Authenticated By: Mackenzie [name], M.D.   Dg Foot Complete Left  01/15/2012   IMPRESSION:  1.  No acute osseous abnormality. 2.  Mild first metatarsal phalangeal joint osteoarthritis.  Original Report Authenticated By: Mackenzie [name], M.D.        Assessment  Mackenzie [name] is a 78 y.o.  female presenting with AMS absent with our evaluation and left LE dry gangrene. Pt is in Hospice Care due to multiple end stage comorbidities.        Plan     1. Dry gangrene of LLE: Pt with multiple vascular f/u and intervention due to PAD ( she has history of bypass graft axilla-bifemoral on the right). On today's Vascular Surgery evaluation the recommendation is above knee amputation.   Contacted POA Mackenzie [name] [phone]) to discuss recommendations of Vascular Surgery. Niece states pt "would not want amputation unless she would lose her life over it"  Will discuss with pt again today and document whether she does not want it done period or will have it done if she medically needs the procedure.  Psych is consulted for competency as well as pt did have AMS on admission and reported at times.  Have been trying to get in contact with psych for the past day and have paged 4+ times along with leaving messages with secretary as this is an issue that needs to be addressed before further proceedings with surgery.    1) Also will be contacting Palliative medicine for Goals of Care help.  We will need capacity deemed appropriate by psych or consent from niece before proceeding with AKA scheduled tomorrow.    2. AMS: not present at the time of examination. Could be the change on narcotic medication. Pt is in several psychotropics agents and a meticulous review is warrant. Could also be sundowning of her dementia condition.  Pt is AAO x 3 when discussing at bedside.  Will need psych eval of competency.  3.Pre-renal azotemia - Elevated solitary BUN at 29.  Most likely stemming from dehydration as not started on fluids upon admission.  Could also be contributing to her AMS.  Will start on carb modified diet and NS @ 13ml/hr.  Will need to change her to NPO starting 7/18 at midnight for her surgery.   4. HTN: will continue ambulatory regimen with lopressor and HCTZ    5. DM: Will cover with SSI and adjust therapy with lantus if needed. Metformin held.  Started on carb modified diet   6. CAD: pt will continue ambulatory treatment with Nitro  patch start in am and removed before bedtime.   7. Sacral decubital ulcer: wound care consult. Pt given tramadol q 6 hrs for pain as well. MS contin 15 mg bid as well for pain control.  8. Calcaneal Ulcer: At stage 2 as well, wound care consult to take care of  this aspect too.    FEN/GI: Regular diet thin. NPO after midnight tonight for VS tomorrow.  NS @ 75 mL/ hr and on carb modified diet after recommendations of swallow eval. Prophylaxis:  Heparin SQ Disposition: Pt on hospice.  Will go for AKA per VS if capacity deemed and appropriate and return to [facility] SNF with Hospice following at discharge.    Twana First Tequilla Cousineau DO, FMTS PGY-1

## 2012-01-17 NOTE — Progress Notes (Signed)
Seen and examined.  Agree with Dr. Paulina Fusi.  Mackenzie [name] is now in a pleasant mood and responds to my questions.  Clearly, she lacks capacity in that she is only oriented x1.  We have asked palliative care to see to clarify goals of care BEFORE she has amputation.    Clearly, she has an ischemic leg and amputation is needed if we are planning typical curative medical care.  If, however, her surrogate decision maker in conjunction with the previously expressed patient wishes, wants a comfort care approach, surgery may not be the right course.  I understand the patient had previously expressed that she did not want surgery.  The issue of goals of care needs to be clarified before proceeding with surgery.

## 2012-01-17 NOTE — Progress Notes (Signed)
This visit was in response to a request from the charge nurse.  Pt was welcoming.  Pt is voicing concern, but her concerns were not clear.  Perhaps this is due to her altered mental status and displaced anxiety. Pt, however, does know that there is an amputation scheduled and she seems to understand a little about the procedure. I offered emotional support and I will continue to follow. Mackenzie [name]  [phone] personal pager

## 2012-01-17 NOTE — Progress Notes (Signed)
Patient Identification:  Mackenzie [name] Date of Evaluation:  01/17/2012 Reason for Consult: Psychiatric Consultation to evaluate capacity, dementia  Referring Provider:   Dr. Paulina Fusi    History of Present Illness:Pt is 78 yo female recently widowed who is transferred from assisted living with hospice care.  She developed   altered mental status: disorientation, hallucinations, delusions and dry gangrene of L Great toe, Second and third totally black  She also has a sacral decubitus  Present Psychiatric History: pt has multiple complex medical conditions.  Reportedly has dementia and has been evaluated for AKA.  Pt has had complicated PAD with Bypass graft Axilla-bifemoral 08/15/11  She has demential which has questioned her ability to make decision about recommended surgery.    Past Medical History:     Past Medical History  Diagnosis Date  . Delirium 12/10/08    hospitalized  . Pelvic fracture 07/01/09    hospitalized  . Coronary artery disease   . Femoral neck fracture   . Anemia   . Hypertension   . Diabetes mellitus   . Dementia   . GI bleed   . Microcytic anemia   . Diverticulosis   . Compression fracture   . Gastric ulcer   . AVM (arteriovenous malformation)     of rectum  . Gastroparesis   . Hiatal hernia 1997  . Esophageal stricture 1997  . Anxiety   . Depression   . GERD (gastroesophageal reflux disease)   . Gout   . Psoriasis   . DJD (degenerative joint disease)   . Vascular disease   . Fall at nursing home 12-10-2011    patient states she fell in her room at nursing home last night, bruising and excoriations noted  on both arms        Past Surgical History  Procedure Date  . Cholecystectomy   . Cataract extraction   . Axillary-femoral bypass graft 08/15/2011    Procedure: BYPASS GRAFT AXILLA-BIFEMORAL;  Surgeon: [name], MD;  Location: [facility] OR;  Service: Vascular;  Laterality: Right;  Embolectomy of Right Femoral Artery with Right  Axilla Femoral Bypass  Graft.  . Total hip arthroplasty     left  . Hemorrhoid surgery   . Incisional hernia repair   . Aortobifemoral bypass graft 10/12/1992  . Common iliac thrombectomy 10/12/1992    with right profunda femoris embolectomy and left popliteal artery transfemoral embolectomy`    Allergies: No Known Allergies  Current Medications:  Prior to Admission medications   Medication Sig Start Date End Date Taking? Authorizing Provider  albuterol (PROVENTIL) (2.5 MG/3ML) 0.083% nebulizer solution Take 2.5 mg by nebulization every 6 (six) hours as needed. For wheezing/shortness of breath.   Yes Historical Provider, MD  alum & mag hydroxide-simeth (MYLANTA DOUBLE-STRENGTH) 400-400-40 MG/5ML suspension Take 30 mLs by mouth every 6 (six) hours as needed. dyspepsia   Yes Historical Provider, MD  donepezil (ARICEPT) 10 MG tablet Take 10 mg by mouth at bedtime.   Yes Historical Provider, MD  DULoxetine (CYMBALTA) 20 MG capsule Take 40 mg by mouth daily.   Yes Historical Provider, MD  ferrous sulfate 325 (65 FE) MG tablet Take 325 mg by mouth 3 (three) times daily with meals.   Yes Historical Provider, MD  hydrochlorothiazide (HYDRODIURIL) 25 MG tablet Take 25 mg by mouth daily.   Yes Historical Provider, MD  HYDROcodone-acetaminophen (NORCO) 5-325 MG per tablet Take 1 tablet by mouth every 4 (four) hours as needed. For pain   Yes Historical  Provider, MD  insulin aspart (NOVOLOG) 100 UNIT/ML injection Inject 0-8 Units into the skin 4 (four) times daily -  before meals and at bedtime. Per sliding scale   Yes Historical Provider, MD  metFORMIN (GLUCOPHAGE) 1000 MG tablet Take 1,000 mg by mouth daily.   Yes Historical Provider, MD  metoprolol (LOPRESSOR) 50 MG tablet Take 50 mg by mouth 2 (two) times daily.   Yes Historical Provider, MD  mirtazapine (REMERON) 15 MG tablet Take 15 mg by mouth at bedtime.   Yes Historical Provider, MD  morphine (MS CONTIN) 15 MG 12 hr tablet Take 15 mg by mouth 2 (two) times daily.   Yes  Historical Provider, MD  Multiple Vitamins-Minerals (CERTAGEN PO) Take by mouth daily.   Yes Historical Provider, MD  nitroGLYCERIN (NITRODUR - DOSED IN MG/24 HR) 0.3 mg/hr Place 1 patch onto the skin daily. Apply in morning, remove at bedtime.   Yes Historical Provider, MD  potassium chloride SA (K-DUR,KLOR-CON) 20 MEQ tablet Take 20 mEq by mouth daily.   Yes Historical Provider, MD  risperiDONE (RISPERDAL) 0.5 MG tablet Take 0.5 mg by mouth at bedtime.   Yes Historical Provider, MD  traMADol (ULTRAM) 50 MG tablet Take 50 mg by mouth every 6 (six) hours as needed.   Yes Historical Provider, MD  vitamin C (ASCORBIC ACID) 500 MG tablet Take 500 mg by mouth 2 (two) times daily.   Yes Historical Provider, MD    Social History:    reports that she has quit smoking. She has never used smokeless tobacco. She reports that she does not drink alcohol or use illicit drugs.   Family History:    Family History  Problem Relation Age of Onset  . Heart failure Father     died age 81  . Stroke Mother     died age 62  . Heart attack Father   . Heart disease Sister   . Heart disease Brother   . Colon cancer Neg Hx   . Diabetes      Mental Status Examination/Evaluation: Objective:  Appearance: Fairly Groomed  Psychomotor Activity:  Increased  Eye Contact::  Good  Speech:  Clear and Coherent and intermittent forgetfulness;   Volume:  Normal  Mood:  Euthymic  Affect:  Appropriate  Thought Process:  Coherent, Relevant, Loose and minor lapses in memory or topic  Orientation:  Other:  oriented to person place and situation  Thought Content:  Delusions  Suicidal Thoughts:  No  Homicidal Thoughts:  No  Judgement:  Intact  Insight:  Present    DIAGNOSIS:   AXIS I   Dementia, mild  AXIS II  Deffered  AXIS III See medical notes.  AXIS IV other psychosocial or environmental problems, problems with access to health care services and complicated serious medical problems  AXIS V 51-60 moderate  symptoms    Pt is seen ~ 2:15pm  01/17/12 Assessment/Plan: Paged Dr. Paulina Fusi; Discussed with Dr. Ladona Ridgel and the Palliative Care Team [2]and RN Pt is sitting in bed with Left foot hanging over the mattress.  She makes a comment on entering the room that she is getting ready to go home and needs to call Ellamae Sia husband].  She calms down and engages in conversation.  She says she came from assisted living and knows they are trying to operate on her LLE black [dry gangrene] toes.  She says she has two children, each have 3 children.  Her son lives in [place] and her daughter lives  in [place] outside of [place].  Eventually during conversation, she realizes Billey Gosling is dead and she sat at the funeral.   She know Fourth of July and asked holidays in December.  She responds, "Merry Christmas and Happy New Year".  She searches mentally for the December date and answers Christmas Eve Dec. 24th and knows Jan. 1st is a holiday and gradually with prompts answers New Years day.  She says that's the holiday when all relatives come over and eat all the leftovers from Christmas.  Discussion focuses on reason [vascular] that she has black toes.  It is explained that the problem will only get worse if nothing is done.  She hesitates to make a decision and says she wants to know what Billey Gosling would say.  She is encouraged that she may make a decision because it is her body.  She stops at one moment, turns to her right shoulder and says "Charlie"  She does not try to speak to external stimuli.  She resumes conversation as the full recommendation of AKA is explained.  The spotty erythema ascending to the knee is discussed.  She had agreed earlier to MD and to RN t0 remove the blackened toes.  Now she is asked about having her L lower leg removed because of the complications.  She says with firm determination. "Well, I am 83 or 84.  I'm not goin' to be running for Miss [place] or Miss [place]. And if I have a problem  with my leg, I didn't cause it.  If I have to go to surgery and come out with a leg and or part of a leg, I might as well go with the flow and 'deal with it'.  She says this with great affect and says she is decided to have the surgery.  The RN, The palliative team [2] RN Harriett Sine and Dr. Ladona Ridgel are told.  Dr. Ladona Ridgel says she has called pt's niece who has POA who is supportive of this aka Procedure.  Pt exhibits mild dementia but is able to express her opinion after hearing the rationale for this procedure. She may benefit from some change in anticholinergic inhibitor to slow, not stop the dementia. RECOMMENDATION:  1.  It is determined this pt has capacity to make a decision about her medical care and has in concert with her POA.  2.  Daily evaluation is encouraged. 3. Consider choice of another analgesic.  Cymbalta is an appropriate SNRI for her condition but the Tramadol is contraindicated with SSRI - question the possible potential to similar Serotonin Syndrome with the serotonin component of this antidepressant. 4.  Suggest Exelon patch 4.6 mg daily.  5. Will follow pt.  Gwynne Kemnitz J. Ferol Luz, MD Psychiatrist  01/17/2012 10:47 PM

## 2012-01-17 NOTE — Consult Note (Signed)
Patient AV:WUJWJXBJ C Melnyk      DOB: 29-May-1928      YNW:[id number]     Consult Note from the Palliative Medicine Team at [facility]    Consult Requested by: Dr. Paulina Fusi     PCP: Carney Living, MD Reason for Consultation: Goals of care     Phone Number:[phone] Symptom recommendations Assessment of patients Current state: 78  Year old white female admitted with vascular compromise of the left leg admitted with symptoms of SIRS/Sepsis related to the gangrene.  Despite patient's mild dementia she is able to articulate the implications of having and not having this leg amputated.  She was able to tell me the conversations that she has had with her niece and stated she wanted to hear what Larita Fife would say.  When I told her that Larita Fife would support her decision either way as long as it made sense and see would be no worse off.  Adrijana stated " I knew she would not abandon me".  I have conveyed the families information, and the spoken with Dr. Baron Sane from Psychiatry who also feels that despite her mild dementia that she understands the choices that she is being asked to make. In summary the patient and her niece would prefer to proceed on to amputation   Goals of Care: 1.  Code Status: DNR confirmed with patients's POA/ neice   2. Scope of Treatment: 1. Vital Signs: Routine  2. Respiratory/Oxygen: As needed 3. Nutritional Support/Tube Feeds: No restrictions other than preoperative orders 4. Antibiotics: Currently on none, continue per surgical recommendations if necessary 5. Blood Products: Should be discussed with the niece if needed. 6. IVF: Continue to use in the course of curative medical care as needed  4. Disposition: Will be determined postoperatively if surgery is performed. The niece would like her to return to her skilled nursing facility but if she does not do well we may need to consider possible residential placement for hospice care if she declines.  3. Symptom  Management:   1. Anxiety/Agitation: Continue Aricept and Remeron, and Risperdal when she is able to take oral medications postoperatively attempt to avoid excessive opiates and excessive benzodiazepines which could induce delirium 2. Pain: The patient has been chronically on MS Contin 15 mg every 12 hours. Post operative we would recommend morphine 1 mg every 2 hours when necessary for pain and would schedule morphine 1 mg every 4 hours to keep some basilar opiate in her system. We'll need to monitor her closely and adjust as needed to avoid delirium. I would not just use Tylenol postoperatively as she has been on chronic opiates and will withdrawal. 3. Bowel Regimen: No documented stool since admission would recommend initiating aggressive bowel regime postoperatively perhaps with MiraLAX and Colace 100 mg twice a day. 4. Delirium: Intermittently present prior to surgery. This may be reflection of her infection superimposed on her dementia. Patient does have an element of dementia but his is pleasant. Will assist you in promoting conditions that would not institute delirium 5. Nausea/Vomiting: None at present but would favor using when necessary Zofran postoperatively 6. Severe excoriations to the scalp: May need to request clubbing of fingernails to assist in neurotic excoriation and may need specialized topical treatment I will attempt to discuss this with the primary service tomorrow.  4. 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 Her niece Laymond Purser  is helping to make decisions on her behalf her overall wishes as expressed in her phone was of care were for her to be as comfortable as possible. Quality of life is to proceed quantity of life. She feels that the amputation would promote quality of life as does the patient. If the patient declines postoperatively further recommendations for comfort care  will be made. Please note the patient is already participating in hospice care at her facility and should she survive surgery and do well she would return with hospice care.  5. Spiritual: [employer] faith and would be supported by the chaplain visit to    Brief HPI: Patient is an 78 year old female sent to the emergency room because of worsening hallucinations delusion and pain. The patient has had a vascular compromise of the left lower extremity and has been being followed by vascular surgery. It appears that her gangrene is progressing and she will require an above-the-knee amputation. Her delirium is secondary to to the infection, superimposed on her dementia. She does have intermittent periods of understanding the significance of her illness and has had conversations with her niece suggesting that she would favor amputation in order to be more comfortable.   ROS: At the time of my interview she does not complaining of pain she does notice that she feels wet from incontinence she's states that she would prefer to have a positive outlook. She denies nausea vomiting or difficulty sleeping. Please note that she is positioned in such a way as to be comfortable which appears awkward but for her is comfortable    PMH:  Past Medical History  Diagnosis Date  . Delirium 12/10/08    hospitalized  . Pelvic fracture 07/01/09    hospitalized  . Coronary artery disease   . Femoral neck fracture   . Anemia   . Hypertension   . Diabetes mellitus   . Dementia   . GI bleed   . Microcytic anemia   . Diverticulosis   . Compression fracture   . Gastric ulcer   . AVM (arteriovenous malformation)     of rectum  . Gastroparesis   . Hiatal hernia 1997  . Esophageal stricture 1997  . Anxiety   . Depression   . GERD (gastroesophageal reflux disease)   . Gout   . Psoriasis   . DJD (degenerative joint disease)   . Vascular disease   . Fall at nursing home 12-10-2011    patient states she fell in  her room at nursing home last night, bruising and excoriations noted  on both arms      PSH: Past Surgical History  Procedure Date  . Cholecystectomy   . Cataract extraction   . Axillary-femoral bypass graft 08/15/2011    Procedure: BYPASS GRAFT AXILLA-BIFEMORAL;  Surgeon: [name], MD;  Location: [facility] OR;  Service: Vascular;  Laterality: Right;  Embolectomy of Right Femoral Artery with Right  Axilla Femoral Bypass Graft.  . Total hip arthroplasty     left  . Hemorrhoid surgery   . Incisional hernia repair   . Aortobifemoral bypass graft 10/12/1992  . Common iliac thrombectomy 10/12/1992    with right profunda femoris embolectomy and left popliteal artery transfemoral embolectomy`   I have reviewed the FH and SH and  If appropriate update it with new information. No Known Allergies Scheduled Meds:   . donepezil  10 mg Oral QHS  . DULoxetine  40 mg Oral Daily  . enoxaparin (LOVENOX) injection  40 mg Subcutaneous  Q24H  . hydrochlorothiazide  25 mg Oral Daily  . insulin aspart  0-9 Units Subcutaneous TID WC  . metoprolol  50 mg Oral BID  . mirtazapine  15 mg Oral QHS  . morphine  15 mg Oral BID  . nitroGLYCERIN  0.3 mg Transdermal Daily  . risperiDONE  0.5 mg Oral QHS  . DISCONTD: cefUROXime (ZINACEF)  IV  1.5 g Intravenous On Call to OR  . DISCONTD: heparin  5,000 Units Subcutaneous Q8H   Continuous Infusions:   . sodium chloride 75 mL/hr (01/17/12 1301)   PRN Meds:.traMADol    BP 107/62  Pulse 92  Temp 97.9 F (36.6 C) (Oral)  Resp 18  Ht 5\' 7"  (1.702 m)  Wt 64.6 kg (142 lb 6.7 oz)  BMI 22.31 kg/m2  SpO2 96%   PPS: 30%  No intake or output data in the 24 hours ending 01/17/12 1703 LBM: None recorded                     Stool Softner: Recommend postop initiation of MiraLAX and ORIF Colace  Physical Exam:  General: Generally no acute distress. Pleasantly demented but in the moment understands what surgery would entail. She understands the consequences of  not having surgery HEENT:  Pupils are equal round and reactive to light extra ocular muscles appear to be intact mucous membranes are moist. His severe excoriation of the scalp which he continues to pick at it and neurotic excoriation like fashion Chest:  Decreased but clear CVS: Irregular rate rhythm positive S1-S2 no S3-S4 3/6 systolic murmur, no rubs or gallop Abdomen: Soft nontender with positive bowel sounds Ext: Left lower extremity shows dry gangrene involving the first through third toes and proximal we'll phalanx left heel also has changes consistent with ischemic necrosis appear, left knee also is an area of erythema approximately 3-4 cm around, without any purulent Neuro: She is oriented to herself and her general surroundings she does have intermittent hallucinations that her spouse is in the room. Her short-term memory does wax and wane  Labs: CBC    Component Value Date/Time   WBC 7.2 01/16/2012 0540   RBC 4.53 01/16/2012 0540   HGB 11.2* 01/16/2012 0540   HCT 35.7* 01/16/2012 0540   PLT 422* 01/16/2012 0540   MCV 78.8 01/16/2012 0540   MCH 24.7* 01/16/2012 0540   MCHC 31.4 01/16/2012 0540   RDW 17.4* 01/16/2012 0540   LYMPHSABS 1.4 01/15/2012 1610   MONOABS 1.1* 01/15/2012 1610   EOSABS 0.1 01/15/2012 1610   BASOSABS 0.1 01/15/2012 1610      CMP     Component Value Date/Time   NA 138 01/16/2012 0540   K 3.8 01/16/2012 0540   CL 96 01/16/2012 0540   CO2 29 01/16/2012 0540   GLUCOSE 107* 01/16/2012 0540   BUN 29* 01/16/2012 0540   CREATININE 0.51 01/16/2012 0540   CREATININE 0.58 05/24/2011 1046   CALCIUM 10.4 01/16/2012 0540   PROT 6.3 11/21/2011 0510   ALBUMIN 2.1* 11/21/2011 0510   AST 18 11/21/2011 0510   ALT 12 11/21/2011 0510   ALKPHOS 308* 11/21/2011 0510   BILITOT 0.2* 11/21/2011 0510   GFRNONAA 88* 01/16/2012 0540   GFRAA >90 01/16/2012 0540    Chest Xray Reviewed/Impressions: No cardiopulmonary abnormalities  CT scan of the Head Reviewed/Impressions: No acute infarction  however there is evidence for atrophy in the cerebellum and cerebral areas with 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  Time In Time Out Total Time Spent with Patient Total Overall Time  430 pm 5pm 30 min 30 min plus phone conference with niece 200-225 pm    Greater than 50%  of this time was spent counseling and coordinating care related to the above assessment and plan.  Emilya Justen L. Ladona Ridgel, MD MBA The Palliative Medicine Team at [facility] Phone: [phone] Pager: [phone]

## 2012-01-17 NOTE — Progress Notes (Signed)
Room 6732- [facility] Palliative Medicine Team - RN visit PMT GOC ordered  - scheduled with niece Laymond Purser via cell [phone]. Niece "does not do heights or elevators" so will be a telephone goals of care at 2pm today 7/17 with Dr. Derenda Mis. Please call PMT phone @ [phone] with any questions.  Thank you, Chalmers Cater RN Hospice/PMT Liaison

## 2012-01-17 NOTE — Progress Notes (Signed)
Update FMTS: Spoke with PA-C Collins of Vascular in regards to pt going for AKA tomorrow.  At this point, both teams agree surgery in the AM is not an option with her ongoing medical problems and the need for palliative care and Psychology to determine her capacity along with her goals of care in regards to end of life.  Palliative Care will be in discussions with pt POA, her niece, this afternoon to figure out a treatment and direction plan from here.  Twana First Dagan Heinz, DO 1210 PM [phone]

## 2012-01-17 NOTE — Progress Notes (Signed)
[facility] [place]  I spoke with the primary care doctor and he and the family have decided to go with palliative care and postpone amputation for now.  They will contact us in the future if our services are needed.                     Lianne Cure PA-C

## 2012-01-17 NOTE — Progress Notes (Signed)
Room 6732 - Shavone Nevers -   [facility] RN Visit  Related admission to [facility] diagnosis of peripheral vascular disease.  Pt is DNR code with OOF DNR and MOST form on shadow chart.    Pt alert, lying in bed, tearful about "what is going on with her."  Pt complained of pain in the LLE.    No family present.  Pt was confused as evidenced by stating her condition must have come from AIDS because it was in her blood, pt did not know she was in a hospital, made reference to the room across the hall didn't look right.  Niece confirmed pt has diagnosis of Alzheimers- is a resident of [facility].  PMT goals of care scheduled for today @ 2pm via cell phone with niece Laymond Purser (who does not do heights or elevators).  Dr. Derenda Mis PMT will speak with niece.  Recommendations per chart are for amputation.    Please call [facility] @ [phone] with any hospice needs.  Thank you.  Joneen Boers, RN  [facility]  Hospice Liaison

## 2012-01-17 NOTE — Consult Note (Signed)
Patient Identification:  Mackenzie [name] Date of Evaluation:  01/17/2012\ Reason for Consult: Evaluate Capacity/ Dementia   Referring Provider: Dr. Paulina Fusi  History of Present Illness:ERROR ENTRY   SEE NOTE OF 01/17/12  Past Psychiatric History:   Past Medical History:     Past Medical History  Diagnosis Date  . Delirium 12/10/08    hospitalized  . Pelvic fracture 07/01/09    hospitalized  . Coronary artery disease   . Femoral neck fracture   . Anemia   . Hypertension   . Diabetes mellitus   . Dementia   . GI bleed   . Microcytic anemia   . Diverticulosis   . Compression fracture   . Gastric ulcer   . AVM (arteriovenous malformation)     of rectum  . Gastroparesis   . Hiatal hernia 1997  . Esophageal stricture 1997  . Anxiety   . Depression   . GERD (gastroesophageal reflux disease)   . Gout   . Psoriasis   . DJD (degenerative joint disease)   . Vascular disease   . Fall at nursing home 12-10-2011    patient states she fell in her room at nursing home last night, bruising and excoriations noted  on both arms        Past Surgical History  Procedure Date  . Cholecystectomy   . Cataract extraction   . Axillary-femoral bypass graft 08/15/2011    Procedure: BYPASS GRAFT AXILLA-BIFEMORAL;  Surgeon: [name], MD;  Location: [facility] OR;  Service: Vascular;  Laterality: Right;  Embolectomy of Right Femoral Artery with Right  Axilla Femoral Bypass Graft.  . Total hip arthroplasty     left  . Hemorrhoid surgery   . Incisional hernia repair   . Aortobifemoral bypass graft 10/12/1992  . Common iliac thrombectomy 10/12/1992    with right profunda femoris embolectomy and left popliteal artery transfemoral embolectomy`    Allergies: No Known Allergies  Current Medications:  Prior to Admission medications   Medication Sig Start Date End Date Taking? Authorizing Provider  albuterol (PROVENTIL) (2.5 MG/3ML) 0.083% nebulizer solution Take 2.5 mg by nebulization every 6 (six)  hours as needed. For wheezing/shortness of breath.   Yes Historical Provider, MD  alum & mag hydroxide-simeth (MYLANTA DOUBLE-STRENGTH) 400-400-40 MG/5ML suspension Take 30 mLs by mouth every 6 (six) hours as needed. dyspepsia   Yes Historical Provider, MD  donepezil (ARICEPT) 10 MG tablet Take 10 mg by mouth at bedtime.   Yes Historical Provider, MD  DULoxetine (CYMBALTA) 20 MG capsule Take 40 mg by mouth daily.   Yes Historical Provider, MD  ferrous sulfate 325 (65 FE) MG tablet Take 325 mg by mouth 3 (three) times daily with meals.   Yes Historical Provider, MD  hydrochlorothiazide (HYDRODIURIL) 25 MG tablet Take 25 mg by mouth daily.   Yes Historical Provider, MD  HYDROcodone-acetaminophen (NORCO) 5-325 MG per tablet Take 1 tablet by mouth every 4 (four) hours as needed. For pain   Yes Historical Provider, MD  insulin aspart (NOVOLOG) 100 UNIT/ML injection Inject 0-8 Units into the skin 4 (four) times daily -  before meals and at bedtime. Per sliding scale   Yes Historical Provider, MD  metFORMIN (GLUCOPHAGE) 1000 MG tablet Take 1,000 mg by mouth daily.   Yes Historical Provider, MD  metoprolol (LOPRESSOR) 50 MG tablet Take 50 mg by mouth 2 (two) times daily.   Yes Historical Provider, MD  mirtazapine (REMERON) 15 MG tablet Take 15 mg by mouth at bedtime.  Yes Historical Provider, MD  morphine (MS CONTIN) 15 MG 12 hr tablet Take 15 mg by mouth 2 (two) times daily.   Yes Historical Provider, MD  Multiple Vitamins-Minerals (CERTAGEN PO) Take by mouth daily.   Yes Historical Provider, MD  nitroGLYCERIN (NITRODUR - DOSED IN MG/24 HR) 0.3 mg/hr Place 1 patch onto the skin daily. Apply in morning, remove at bedtime.   Yes Historical Provider, MD  potassium chloride SA (K-DUR,KLOR-CON) 20 MEQ tablet Take 20 mEq by mouth daily.   Yes Historical Provider, MD  risperiDONE (RISPERDAL) 0.5 MG tablet Take 0.5 mg by mouth at bedtime.   Yes Historical Provider, MD  traMADol (ULTRAM) 50 MG tablet Take 50 mg by  mouth every 6 (six) hours as needed.   Yes Historical Provider, MD  vitamin C (ASCORBIC ACID) 500 MG tablet Take 500 mg by mouth 2 (two) times daily.   Yes Historical Provider, MD    Social History:    reports that she has quit smoking. She has never used smokeless tobacco. She reports that she does not drink alcohol or use illicit drugs.   Family History:    Family History  Problem Relation Age of Onset  . Heart failure Father     died age 22  . Stroke Mother     died age 30  . Heart attack Father   . Heart disease Sister   . Heart disease Brother   . Colon cancer Neg Hx   . Diabetes         Assessment/Plan: SEE COMPLETED NOTE 01/17/12  Shail Urbas J. Ferol Luz, MD Psychiatrist

## 2012-01-17 NOTE — Discharge Summary (Signed)
Physician Discharge Summary  Patient ID: Mackenzie [name] MRN: [id number] DOB: January 27, 1928 Age: 78 y.o.  Admit date: 01/15/2012 Discharge date: 01/20/2012 Admitting Physician: Mackenzie Ramp, MD  PCP: Mackenzie Living, MD  Consultants:Palliative Care, Psychology, Vascular Surgery   Discharge Diagnosis: Active Problems:  Gangrene of toe  Pain in limb  Delirium  Anxiety  Hospital Course Mackenzie [name] is a 78 y.o. female presenting with AMS (absent with our evaluation) and left LE dry gangrene. Pt is in Hospice Care due to multiple end stage comorbidities.   1. Dry gangrene of LLE: Pt had  multiple vascular events prior to admission and intervention due to PAD ( she has history of bypass graft axilla-bifemoral on the right). Vascular Surgery was consulted and they recommended AKA of the L leg due to irreversible PVD.  Pt is on hospice and her capacity was in question.  Palliative Care was consulted along with Psychology to help with Goals of Care and determine pt's capacity.  Psychology stated she has capacity and both palliative and psych with the patient determined her GOC included having the AKA for Sx control. Vascular surgery took pt to the OR for AKA of the left leg on 01/18/12. They amputated the leg at the femur and sent the specimen for pathology.  There were no complications or after surgery. Patient's pain was well controlled. Will need daily dressing changes after discharge. She will follow up with Vascular Surgery in 4 weeks.  2. Altered Mental Status due to end stage dementia: This was present at the time of admission but did wax and wane during her hospital stay. Pt was stopped on her multiple psychotropic agents and continued on her MS contin 15 mg bid with intermittent pain medication on Norco.  She was dehydrated upon admission as well and was given fluids @ 75 ml/hr and her mental status improved as well. Psychology was consulted to come assess and deemed pt was competent to make  medical decisions.  3. Pre-renal azotemia - Elevated solitary BUN at 29 upon admission, that most likely stemmed from dehydration.  She was started on NS @ 67ml/hr and her kidney function improved with BUN dropping to 12.    4. HTN: Stable, continued ambulatory regimen of lopressor and HCTZ. No changes made at discharge  5. DM: Stable, started on SSI with carb modified diet. No changes made at discharge  6. CAD: Stable, continued home medications  7. Sacral decubital ulcer: A wound care consult was placed and they continued to followed during hospital stay. Also, pt given Norco q 6 hrs and MS contin 15 mg bid as well for pain control.   8. Calcaneal Ulcer: Stage II ulcer that wound care managed until AKA by vascular surgery.        Discharge PE   Filed Vitals:   01/20/12 1259  BP: 123/35  Pulse: 81  Temp: 99 F (37.2 C)  Resp: 18   Physical Exam:  Gen: Sleeping pleasantly, in NAD  HEENT: slightly dry  CV: Regular rate and rhythm, no murmurs rubs or gallops  PULM: clear to auscultation bilaterally. No wheezes/rales/rhonchi  ABD: soft/nontender/nondistended/normal bowel sounds  EXT: + for AKA amputation L leg, which is wrapped today.  Neuro: Alert at times, More oriented than yesterday  Psych: [place] mental status  Procedures/Imaging:  Dg Chest 2 View 01/15/2012  IMPRESSION: No acute abnormality.    Ct Head Wo Contrast 01/15/2012   IMPRESSION: 1.  No acute intracranial abnormalities. 2.  Cerebral and cerebellar atrophy with mild chronic microvascular ischemic changes in the cerebral white matter and old infarction in the left sub insular region, similar to prior examination 02/01/2011.    Dg Foot Complete Left 01/15/2012   IMPRESSION:  1.  No acute osseous abnormality. 2.  Mild first metatarsal phalangeal joint osteoarthritis.   Labs  CBC  Lab 01/18/12 0600 01/16/12 0540 01/15/12 1610  WBC 6.3 7.2 9.5  HGB 9.7* 11.2* 11.7*  HCT 31.4* 35.7* 36.9  PLT 364 422* 417*    BMET  Lab 01/18/12 0600 01/16/12 0540 01/15/12 1610  NA 136 138 136  K 3.0* 3.8 4.8  CL 96 96 95*  CO2 28 29 27   BUN 12 29* 31*  CREATININE 0.39* 0.51 0.55  CALCIUM 9.5 10.4 10.3  PROT -- -- --  BILITOT -- -- --  ALKPHOS -- -- --  ALT -- -- --  AST -- -- --  GLUCOSE 82 107* 115*   Results for orders placed during the hospital encounter of 01/15/12 (from the past 72 hour(s))  GLUCOSE, CAPILLARY     Status: Normal   Collection Time   01/17/12  5:51 PM      Component Value Range Comment   Glucose-Capillary 70  70 - 99 mg/dL   GLUCOSE, CAPILLARY     Status: Normal   Collection Time   01/17/12  9:20 PM      Component Value Range Comment   Glucose-Capillary 71  70 - 99 mg/dL   BASIC METABOLIC PANEL     Status: Abnormal   Collection Time   01/18/12  6:00 AM      Component Value Range Comment   Sodium 136  135 - 145 mEq/L    Potassium 3.0 (*) 3.5 - 5.1 mEq/L    Chloride 96  96 - 112 mEq/L    CO2 28  19 - 32 mEq/L    Glucose, Bld 82  70 - 99 mg/dL    BUN 12  6 - 23 mg/dL    Creatinine, Ser 6.96 (*) 0.50 - 1.10 mg/dL    Calcium 9.5  8.4 - 29.5 mg/dL    GFR calc non Af Amer >90  >90 mL/min    GFR calc Af Amer >90  >90 mL/min   CBC     Status: Abnormal   Collection Time   01/18/12  6:00 AM      Component Value Range Comment   WBC 6.3  4.0 - 10.5 K/uL    RBC 4.01  3.87 - 5.11 MIL/uL    Hemoglobin 9.7 (*) 12.0 - 15.0 g/dL    HCT 28.4 (*) 13.2 - 46.0 %    MCV 78.3  78.0 - 100.0 fL    MCH 24.2 (*) 26.0 - 34.0 pg    MCHC 30.9  30.0 - 36.0 g/dL    RDW 44.0 (*) 10.2 - 15.5 %    Platelets 364  150 - 400 K/uL   GLUCOSE, CAPILLARY     Status: Normal   Collection Time   01/18/12  7:55 AM      Component Value Range Comment   Glucose-Capillary 83  70 - 99 mg/dL   SURGICAL PCR SCREEN     Status: Normal   Collection Time   01/18/12  9:17 AM      Component Value Range Comment   MRSA, PCR NEGATIVE  NEGATIVE    Staphylococcus aureus NEGATIVE  NEGATIVE   GLUCOSE, CAPILLARY  Status: Normal   Collection Time   01/18/12 11:59 AM    Patient condition at time of discharge/disposition: stable  Disposition-[facility] with hospice  Follow up issues: 1. Please continue dressing changes daily. 2. Continue to give Ensure with meals 3. Hospice has been following along, and will continue to follow  Discharge follow up:  Follow-up Information    Follow up with Myra Gianotti IV, Lala Lund, MD. Schedule an appointment as soon as possible for a visit in 4 weeks.   Contact information:   [address] [place] [address] [phone]       Follow up with Mackenzie Living, MD. Schedule an appointment as soon as possible for a visit in 1 week.   Contact information:   [address] [place] [address] [phone]         Discharge Instructions: Please refer to Patient Instructions section of EMR for full details.  Patient was counseled important signs and symptoms that should prompt return to medical care, changes in medications, dietary instructions, activity restrictions, and follow up appointments.  Significant instructions noted below:  Discharge Orders    Future Orders Please Complete By Expires   Diet - low sodium heart healthy      Increase activity slowly      Change dressing (specify)      Comments:   Dressing change daily     Discharge Medications Medication List  As of 01/20/2012  4:49 PM   START taking these medications         * feeding supplement Liqd   Take 237 mLs by mouth 2 (two) times daily as needed (poor PO intake).      * feeding supplement Pudg   Take 1 Container by mouth 3 (three) times daily between meals.     * Notice: This list has 2 medication(s) that are the same as other medications prescribed for you. Read the directions carefully, and ask your doctor or other care provider to review them with you.       CONTINUE taking these medications         albuterol (2.5 MG/3ML) 0.083% nebulizer  solution   Commonly known as: PROVENTIL      CERTAGEN PO      donepezil 10 MG tablet   Commonly known as: ARICEPT      DULoxetine 20 MG capsule   Commonly known as: CYMBALTA      ferrous sulfate 325 (65 FE) MG tablet      hydrochlorothiazide 25 MG tablet   Commonly known as: HYDRODIURIL      HYDROcodone-acetaminophen 5-325 MG per tablet   Commonly known as: NORCO/VICODIN      insulin aspart 100 UNIT/ML injection   Commonly known as: novoLOG      metFORMIN 1000 MG tablet   Commonly known as: GLUCOPHAGE      metoprolol 50 MG tablet   Commonly known as: LOPRESSOR      mirtazapine 15 MG tablet   Commonly known as: REMERON      morphine 15 MG 12 hr tablet   Commonly known as: MS CONTIN      MYLANTA DOUBLE-STRENGTH 400-400-40 MG/5ML suspension   Generic drug: alum & mag hydroxide-simeth      nitroGLYCERIN 0.3 mg/hr   Commonly known as: NITRODUR - Dosed in mg/24 hr      potassium chloride SA 20 MEQ tablet   Commonly known as: K-DUR,KLOR-CON      risperiDONE 0.5 MG  tablet   Commonly known as: RISPERDAL      traMADol 50 MG tablet   Commonly known as: ULTRAM      vitamin C 500 MG tablet   Commonly known as: ASCORBIC ACID          Where to get your medications    These are the prescriptions that you need to pick up. We sent them to a specific pharmacy, so you will need to go there to get them.   [facility] [address] [place], [place] - [address]    [address] [place] [address]    Phone: [phone]        feeding supplement Liqd   feeding supplement [employer]. Mikel Cella, M.D. of [facility] 01/20/2012 4:49 PM

## 2012-01-18 ENCOUNTER — Encounter (HOSPITAL_COMMUNITY): Payer: Self-pay | Admitting: General Practice

## 2012-01-18 ENCOUNTER — Encounter (HOSPITAL_COMMUNITY): Payer: Self-pay | Admitting: Anesthesiology

## 2012-01-18 ENCOUNTER — Encounter (HOSPITAL_COMMUNITY)
Admission: EM | Disposition: A | Discharge status: Hospice | Payer: Self-pay | Source: Home / Self Care | Attending: Family Medicine

## 2012-01-18 ENCOUNTER — Inpatient Hospital Stay (HOSPITAL_COMMUNITY): Payer: Medicare Other | Admitting: Anesthesiology

## 2012-01-18 DIAGNOSIS — I70269 Atherosclerosis of native arteries of extremities with gangrene, unspecified extremity: Secondary | ICD-10-CM

## 2012-01-18 DIAGNOSIS — F419 Anxiety disorder, unspecified: Secondary | ICD-10-CM

## 2012-01-18 DIAGNOSIS — I743 Embolism and thrombosis of arteries of the lower extremities: Secondary | ICD-10-CM

## 2012-01-18 DIAGNOSIS — R410 Disorientation, unspecified: Secondary | ICD-10-CM

## 2012-01-18 DIAGNOSIS — M79609 Pain in unspecified limb: Secondary | ICD-10-CM

## 2012-01-18 HISTORY — PX: AMPUTATION: SHX166

## 2012-01-18 LAB — BASIC METABOLIC PANEL
Calcium: 9.5 mg/dL (ref 8.4–10.5)
GFR calc Af Amer: >90 mL/min (ref >90)
GFR calc non Af Amer: >90 mL/min (ref >90)
Sodium: 136 mEq/L (ref 135–145)

## 2012-01-18 LAB — CBC
MCH: 24.2 pg — ABNORMAL LOW (ref 26.0–34.0)
Platelets: 364 10*3/uL (ref 150–400)
RBC: 4.01 MIL/uL (ref 3.87–5.11)
WBC: 6.3 10*3/uL (ref 4.0–10.5)

## 2012-01-18 LAB — GLUCOSE, CAPILLARY
Glucose-Capillary: 89 mg/dL (ref 70–99)
Glucose-Capillary: 93 mg/dL (ref 70–99)
Glucose-Capillary: 105 mg/dL — ABNORMAL HIGH (ref 70–99)

## 2012-01-18 SURGERY — AMPUTATION, ABOVE KNEE
Anesthesia: General | Site: Leg Upper | Laterality: Left | Wound class: Clean

## 2012-01-18 MED ORDER — 0.9 % SODIUM CHLORIDE (POUR BTL) OPTIME
TOPICAL | Status: DC | PRN
  Administered 2012-01-18: 1000 mL

## 2012-01-18 MED ORDER — ONDANSETRON HCL 4 MG/2ML IJ SOLN
INTRAMUSCULAR | Status: DC | PRN
  Administered 2012-01-18: 4 mg via INTRAVENOUS

## 2012-01-18 MED ORDER — PROPOFOL 10 MG/ML IV EMUL
INTRAVENOUS | Status: DC | PRN
  Administered 2012-01-18: 80 mg via INTRAVENOUS

## 2012-01-18 MED ORDER — CEFUROXIME SODIUM 1.5 G IJ SOLR
1.5000 g | INTRAMUSCULAR | Status: DC | PRN
  Administered 2012-01-18: 1.5 g via INTRAVENOUS

## 2012-01-18 MED ORDER — LACTATED RINGERS IV SOLN
INTRAVENOUS | Status: DC | PRN
  Administered 2012-01-18: 13:00:00 via INTRAVENOUS

## 2012-01-18 MED ORDER — LIDOCAINE HCL (CARDIAC) 20 MG/ML IV SOLN
INTRAVENOUS | Status: DC | PRN
  Administered 2012-01-18: 20 mg via INTRAVENOUS

## 2012-01-18 MED ORDER — GLYCOPYRROLATE 0.2 MG/ML IJ SOLN
INTRAMUSCULAR | Status: DC | PRN
  Administered 2012-01-18: 0.4 mg via INTRAVENOUS

## 2012-01-18 MED ORDER — MEPERIDINE HCL 25 MG/ML IJ SOLN: 6.2500 mg | INTRAMUSCULAR | Status: DC | PRN

## 2012-01-18 MED ORDER — FENTANYL CITRATE 0.05 MG/ML IJ SOLN
INTRAMUSCULAR | Status: DC | PRN
  Administered 2012-01-18: 150 ug via INTRAVENOUS
  Administered 2012-01-18: 50 ug via INTRAVENOUS

## 2012-01-18 MED ORDER — HYDROMORPHONE HCL PF 1 MG/ML IJ SOLN
0.2500 mg | INTRAMUSCULAR | Status: DC | PRN
  Administered 2012-01-18 (×3): 0.25 mg via INTRAVENOUS

## 2012-01-18 MED ORDER — CEFUROXIME SODIUM 1.5 G IJ SOLR
INTRAMUSCULAR | Status: AC
  Filled 2012-01-18: qty 1.5

## 2012-01-18 MED ORDER — PROMETHAZINE HCL 25 MG/ML IJ SOLN: 6.2500 mg | INTRAMUSCULAR | Status: DC | PRN

## 2012-01-18 MED ORDER — DEXTROSE 5 % IV SOLN
INTRAVENOUS | Status: AC
  Filled 2012-01-18: qty 50

## 2012-01-18 MED ORDER — HYDROCODONE-ACETAMINOPHEN 10-325 MG PO TABS: 1.0000 | ORAL_TABLET | Freq: Four times a day (QID) | ORAL | Status: DC | PRN

## 2012-01-18 MED ORDER — BACITRACIN ZINC 500 UNIT/GM EX OINT
TOPICAL_OINTMENT | CUTANEOUS | Status: AC
  Filled 2012-01-18: qty 15

## 2012-01-18 MED ORDER — POTASSIUM CHLORIDE CRYS ER 20 MEQ PO TBCR
40.0000 meq | EXTENDED_RELEASE_TABLET | Freq: Once | ORAL | Status: AC
  Administered 2012-01-18: 40 meq via ORAL
  Filled 2012-01-18: qty 2

## 2012-01-18 MED ORDER — ROCURONIUM BROMIDE 100 MG/10ML IV SOLN
INTRAVENOUS | Status: DC | PRN
  Administered 2012-01-18: 40 mg via INTRAVENOUS

## 2012-01-18 MED ORDER — NEOSTIGMINE METHYLSULFATE 1 MG/ML IJ SOLN
INTRAMUSCULAR | Status: DC | PRN
  Administered 2012-01-18: 3 mg via INTRAVENOUS

## 2012-01-18 MED ORDER — MORPHINE SULFATE 2 MG/ML IJ SOLN
2.0000 mg | INTRAMUSCULAR | Status: DC | PRN
  Administered 2012-01-19: 2 mg via INTRAVENOUS
  Filled 2012-01-18: qty 1

## 2012-01-18 MED ORDER — BACITRACIN ZINC 500 UNIT/GM EX OINT
TOPICAL_OINTMENT | CUTANEOUS | Status: DC | PRN
  Administered 2012-01-18: 1 via TOPICAL

## 2012-01-18 MED ORDER — PHENYLEPHRINE HCL 10 MG/ML IJ SOLN
INTRAMUSCULAR | Status: DC | PRN
  Administered 2012-01-18: 40 ug via INTRAVENOUS
  Administered 2012-01-18 (×3): 80 ug via INTRAVENOUS

## 2012-01-18 MED ORDER — MIDAZOLAM HCL 2 MG/2ML IJ SOLN: 0.5000 mg | Freq: Once | INTRAMUSCULAR | Status: DC | PRN

## 2012-01-18 SURGICAL SUPPLY — 53 items
BANDAGE ELASTIC 4 VELCRO ST LF (GAUZE/BANDAGES/DRESSINGS) IMPLANT
BANDAGE ELASTIC 6 VELCRO ST LF (GAUZE/BANDAGES/DRESSINGS) ×2 IMPLANT
BANDAGE ESMARK 6X9 LF (GAUZE/BANDAGES/DRESSINGS) IMPLANT
BANDAGE GAUZE ELAST BULKY 4 IN (GAUZE/BANDAGES/DRESSINGS) ×2 IMPLANT
BNDG COHESIVE 6X5 TAN STRL LF (GAUZE/BANDAGES/DRESSINGS) ×2 IMPLANT
BNDG ESMARK 6X9 LF (GAUZE/BANDAGES/DRESSINGS)
CANISTER SUCTION 2500CC (MISCELLANEOUS) ×2 IMPLANT
CLIP TI MEDIUM 6 (CLIP) IMPLANT
CLOTH BEACON ORANGE TIMEOUT ST (SAFETY) ×2 IMPLANT
COVER SURGICAL LIGHT HANDLE (MISCELLANEOUS) ×2 IMPLANT
CUFF TOURNIQUET SINGLE 34IN LL (TOURNIQUET CUFF) IMPLANT
CUFF TOURNIQUET SINGLE 44IN (TOURNIQUET CUFF) IMPLANT
DRAIN CHANNEL 19F RND (DRAIN) IMPLANT
DRAPE ORTHO SPLIT 77X108 STRL (DRAPES) ×2
DRAPE PROXIMA HALF (DRAPES) ×2 IMPLANT
DRAPE SURG ORHT 6 SPLT 77X108 (DRAPES) ×2 IMPLANT
DRSG ADAPTIC 3X8 NADH LF (GAUZE/BANDAGES/DRESSINGS) ×2 IMPLANT
ELECT REM PT RETURN 9FT ADLT (ELECTROSURGICAL) ×2
ELECTRODE REM PT RTRN 9FT ADLT (ELECTROSURGICAL) ×1 IMPLANT
EVACUATOR SILICONE 100CC (DRAIN) IMPLANT
GAUZE SPONGE 4X4 16PLY XRAY LF (GAUZE/BANDAGES/DRESSINGS) IMPLANT
GLOVE BIOGEL M 6.5 STRL (GLOVE) ×2 IMPLANT
GLOVE BIOGEL PI IND STRL 6.5 (GLOVE) ×2 IMPLANT
GLOVE BIOGEL PI IND STRL 7.5 (GLOVE) ×1 IMPLANT
GLOVE BIOGEL PI INDICATOR 6.5 (GLOVE) ×2
GLOVE BIOGEL PI INDICATOR 7.5 (GLOVE) ×1
GLOVE SS BIOGEL STRL SZ 7 (GLOVE) ×1 IMPLANT
GLOVE SUPERSENSE BIOGEL SZ 7 (GLOVE) ×1
GLOVE SURG SS PI 7.5 STRL IVOR (GLOVE) ×2 IMPLANT
GOWN PREVENTION PLUS XXLARGE (GOWN DISPOSABLE) ×2 IMPLANT
GOWN STRL NON-REIN LRG LVL3 (GOWN DISPOSABLE) ×4 IMPLANT
KIT BASIN OR (CUSTOM PROCEDURE TRAY) ×2 IMPLANT
KIT ROOM TURNOVER OR (KITS) ×2 IMPLANT
NS IRRIG 1000ML POUR BTL (IV SOLUTION) ×2 IMPLANT
PACK GENERAL/GYN (CUSTOM PROCEDURE TRAY) ×2 IMPLANT
PAD ARMBOARD 7.5X6 YLW CONV (MISCELLANEOUS) ×4 IMPLANT
PADDING CAST COTTON 6X4 STRL (CAST SUPPLIES) IMPLANT
SAW GIGLI STERILE 20 (MISCELLANEOUS) ×2 IMPLANT
SPONGE GAUZE 4X4 12PLY (GAUZE/BANDAGES/DRESSINGS) ×2 IMPLANT
STAPLER VISISTAT 35W (STAPLE) ×2 IMPLANT
STOCKINETTE IMPERVIOUS LG (DRAPES) ×2 IMPLANT
SUT ETHILON 3 0 PS 1 (SUTURE) IMPLANT
SUT SILK 0 TIES 10X30 (SUTURE) IMPLANT
SUT SILK 2 0 (SUTURE) ×1
SUT SILK 2-0 18XBRD TIE 12 (SUTURE) ×1 IMPLANT
SUT SILK 3 0 (SUTURE)
SUT SILK 3-0 18XBRD TIE 12 (SUTURE) IMPLANT
SUT VIC AB 2-0 CT1 18 (SUTURE) ×6 IMPLANT
TAPE UMBILICAL COTTON 1/8X30 (MISCELLANEOUS) ×2 IMPLANT
TOWEL OR 17X24 6PK STRL BLUE (TOWEL DISPOSABLE) ×2 IMPLANT
TOWEL OR 17X26 10 PK STRL BLUE (TOWEL DISPOSABLE) ×2 IMPLANT
UNDERPAD 30X30 INCONTINENT (UNDERPADS AND DIAPERS) ×2 IMPLANT
WATER STERILE IRR 1000ML POUR (IV SOLUTION) ×2 IMPLANT

## 2012-01-18 NOTE — Preoperative (Signed)
Beta Blockers   Reason not to administer Beta Blockers:Not Applicable 

## 2012-01-18 NOTE — Progress Notes (Signed)
PGY-1 Daily Progress Note Family Medicine Teaching Service [place] R. Mariamawit Depaoli, DO Service Pager: [phone]   Subjective: Pt seen and examined at bedside.  Overnight, pt did have pain in her LLE and c/o pain this AM.  Psychiatry and Palliative came by to assess pt yesterday.   Psych determined she has capacity and she agrees she will go ahead with the amputation of the LLE knowing she may pass away on the table with the risk of anesthesia and the complexity of the operation.  Objective:  VITALS Temp:  [97.2 F (36.2 C)-98.1 F (36.7 C)] 97.2 F (36.2 C) (07/18 1615) Pulse Rate:  [92-108] 98  (07/18 1615) Resp:  [10-18] 16  (07/18 1617) BP: (93-166)/(21-81) 153/58 mmHg (07/18 1603) SpO2:  [92 %-100 %] 96 % (07/18 1615) FiO2 (%):  [2 %] 2 % (07/18 1615) Weight:  [143 lb 8.3 oz (65.1 kg)] 143 lb 8.3 oz (65.1 kg) (07/17 1957)  In/Out  Intake/Output Summary (Last 24 hours) at 01/18/12 1641 Last data filed at 01/18/12 1600  Gross per 24 hour  Intake    800 ml  Output      0 ml  Net    800 ml    Physical Exam: Gen:  Obvious distress, crying heavily HEENT: slightly dry CV: Regular rate and rhythm, no murmurs rubs or gallops PULM: clear to auscultation bilaterally. No wheezes/rales/rhonchi ABD: soft/nontender/nondistended/normal bowel sounds EXT: No edema, + 1, 2 , 3 digitis on L foot gangrene.  + calcaneal and lateral mallelous stage 2 ulcers.  + Sacral Decubitus ulcer Neuro: Alert   MEDS Scheduled Meds:    . donepezil  10 mg Oral QHS  . DULoxetine  40 mg Oral Daily  . enoxaparin (LOVENOX) injection  40 mg Subcutaneous Q24H  . hydrochlorothiazide  25 mg Oral Daily  . insulin aspart  0-9 Units Subcutaneous TID WC  . metoprolol  50 mg Oral BID  . mirtazapine  15 mg Oral QHS  . morphine  15 mg Oral BID  . nitroGLYCERIN  0.3 mg Transdermal Daily  . potassium chloride  40 mEq Oral Once  . risperiDONE  0.5 mg Oral QHS   Continuous Infusions:    . sodium chloride 75 mL/hr  (01/17/12 1301)   PRN Meds:.HYDROcodone-acetaminophen, DISCONTD: 0.9 % irrigation (POUR BTL), DISCONTD: bacitracin, DISCONTD:  HYDROmorphone (DILAUDID) injection, DISCONTD: meperidine (DEMEROL) injection, DISCONTD: midazolam, DISCONTD: promethazine, DISCONTD: traMADol  Labs and imaging:   CBC  Lab 01/18/12 0600 01/16/12 0540 01/15/12 1610  WBC 6.3 7.2 9.5  HGB 9.7* 11.2* 11.7*  HCT 31.4* 35.7* 36.9  PLT 364 422* 417*   BMET/CMET  Lab 01/18/12 0600 01/16/12 0540 01/15/12 1610  NA 136 138 136  K 3.0* 3.8 4.8  CL 96 96 95*  CO2 28 29 27   BUN 12 29* 31*  CREATININE 0.39* 0.51 0.55  CALCIUM 9.5 10.4 10.3  PROT -- -- --  BILITOT -- -- --  ALKPHOS -- -- --  ALT -- -- --  AST -- -- --  GLUCOSE 82 107* 115*   Results for orders placed during the hospital encounter of 01/15/12 (from the past 24 hour(s))  GLUCOSE, CAPILLARY     Status: Normal   Collection Time   01/17/12  5:51 PM      Component Value Range   Glucose-Capillary 70  70 - 99 mg/dL  GLUCOSE, CAPILLARY     Status: Normal   Collection Time   01/17/12  9:20 PM  Component Value Range   Glucose-Capillary 71  70 - 99 mg/dL  BASIC METABOLIC PANEL     Status: Abnormal   Collection Time   01/18/12  6:00 AM      Component Value Range   Sodium 136  135 - 145 mEq/L   Potassium 3.0 (*) 3.5 - 5.1 mEq/L   Chloride 96  96 - 112 mEq/L   CO2 28  19 - 32 mEq/L   Glucose, Bld 82  70 - 99 mg/dL   BUN 12  6 - 23 mg/dL   Creatinine, Ser 1.61 (*) 0.50 - 1.10 mg/dL   Calcium 9.5  8.4 - 09.6 mg/dL   GFR calc non Af Amer >90  >90 mL/min   GFR calc Af Amer >90  >90 mL/min  CBC     Status: Abnormal   Collection Time   01/18/12  6:00 AM      Component Value Range   WBC 6.3  4.0 - 10.5 K/uL   RBC 4.01  3.87 - 5.11 MIL/uL   Hemoglobin 9.7 (*) 12.0 - 15.0 g/dL   HCT 04.5 (*) 40.9 - 81.1 %   MCV 78.3  78.0 - 100.0 fL   MCH 24.2 (*) 26.0 - 34.0 pg   MCHC 30.9  30.0 - 36.0 g/dL   RDW 91.4 (*) 78.2 - 95.6 %   Platelets 364  150 - 400  K/uL  GLUCOSE, CAPILLARY     Status: Normal   Collection Time   01/18/12  7:55 AM      Component Value Range   Glucose-Capillary 83  70 - 99 mg/dL  SURGICAL PCR SCREEN     Status: Normal   Collection Time   01/18/12  9:17 AM      Component Value Range   MRSA, PCR NEGATIVE  NEGATIVE   Staphylococcus aureus NEGATIVE  NEGATIVE  GLUCOSE, CAPILLARY     Status: Normal   Collection Time   01/18/12 11:59 AM      Component Value Range   Glucose-Capillary 89  70 - 99 mg/dL  GLUCOSE, CAPILLARY     Status: Normal   Collection Time   01/18/12  3:20 PM      Component Value Range   Glucose-Capillary 93  70 - 99 mg/dL   Comment 1 Notify RN     Dg Chest 2 View  01/15/2012  IMPRESSION: No acute abnormality.  Please see above.  Original Report Authenticated By: Fuller Canada, M.D.   Ct Head Wo Contrast  01/15/2012   IMPRESSION: 1.  No acute intracranial abnormalities. 2.  Cerebral and cerebellar atrophy with mild chronic microvascular ischemic changes in the cerebral white matter and old infarction in the left sub insular region, similar to prior examination 02/01/2011.  Original Report Authenticated By: Florencia Reasons, M.D.   Dg Foot Complete Left  01/15/2012   IMPRESSION:  1.  No acute osseous abnormality. 2.  Mild first metatarsal phalangeal joint osteoarthritis.  Original Report Authenticated By: Reyes Ivan, M.D.    Assessment  Mackenzie [name] is a 78 y.o.  female presenting with AMS absent with our evaluation and left LE dry gangrene. Pt is in Hospice Care due to multiple end stage comorbidities.       Plan     1. Dry gangrene of LLE: Pt with multiple vascular f/u and intervention due to PAD ( she has history of bypass graft axilla-bifemoral on the right). On today's Vascular Surgery evaluation the recommendation is  above knee amputation.   Contacted POA [name] [phone]) to discuss recommendations of Vascular Surgery.   1) Palliative medicine and psychology came  to assess patient yesterday  2) They determined she has capacity (POA agree with pt plan) and she would like to proceed with the amputation of the LLE  3) Talked to vascular surgery and she is being taken to the OR today for AKA of L leg.  Will let vascular surgery manage her post-op medications and will continue to follow   2. AMS: not present at the time of examination. Could be the change on narcotic medication. Pt is in several psychotropics agents and a meticulous review is warrant. Could also be sundowning of her dementia condition.  Pt is AAO x 3 when discussing at bedside.  Pt competent according to psych  3.Pre-renal azotemia - Elevated solitary BUN at 29.  Most likely stemming from dehydration as not started on fluids upon admission.  Could also be contributing to her AMS.  Will start on carb modified diet and NS @ 57ml/hr.  Will need to change her to NPO starting 7/18 at midnight for her surgery.  Today her BUN 12, most likely resolved from hydrating pt.  4. HTN: will continue ambulatory regimen with lopressor and HCTZ    5. DM: Will cover with SSI and adjust therapy with lantus if needed. Metformin held.  Started on carb modified diet  6. CAD: pt will continue ambulatory treatment with Nitro patch start in am and removed before bedtime.   7. Sacral decubital ulcer: wound care consult. Pt given Norco q 6 hrs. MS contin 15 mg bid as well for pain control.  8. Calcaneal Ulcer: At stage 2 as well, wound care consult to take care of this aspect too.    FEN/GI: Regular diet thin. NPO after midnight tonight for VS tomorrow.  NS @ 75 mL/ hr and on carb modified diet after recommendations of swallow eval. Prophylaxis:  Heparin SQ Disposition: Pt on hospice.  Will go for AKA per VS today and will be d/c to [facility] with Hospice when medically stable.   Twana First Tashi Band DO, FMTS PGY-1

## 2012-01-18 NOTE — Op Note (Signed)
[facility] [place]  Patient name: Mackenzie [name] MRN: [id number] DOB: 03/07/1928 Sex: female  01/15/2012 - 01/18/2012 Pre-operative Diagnosis: Ischemic left leg Post-operative diagnosis:  Same Surgeon:  Jorge Ny Assistants:  Narda Amber, PA Procedure:   Left above-knee amputation Anesthesia:  Gen. Blood Loss:  See anesthesia record Specimens:  Left leg  Findings:  Viable muscle at amputation site  Indications:   The patient was admitted for pain and mental status changes. She has a known ischemic left foot. She originally presented to the hospital several months ago with a subacute occlusion of the left limb of her aortobifemoral bypass graft. When she came to the hospital the leg was not salvageable. She has been trying to upper leg amputation. She has necrotic toes as well as a large necrotic heel ulcer. She is now having mental status changes which are worse than her baseline. She also has tenderness in severe pain. I discussed this with her power of attorney. The patient was also cleared by psychiatry to sign her permission slip. She agrees to a left above-knee amputation.  Procedure:  The patient was identified in the holding area and taken to [facility] OR ROOM 12  The patient was then placed supine on the table. general anesthesia was administered.  The patient was prepped and draped in the usual sterile fashion.  A time out was called and antibiotics were administered.  A fish mouth incision was made above the knee. Cautery was used to divide the subcutaneous tissue. The muscle was then divided with cautery. The femur was then circumferentially exposed. A periosteal elevator was used to elevate the periosteum. A Gigli saw was used to transect the femur beveling the anterior surface. The neurovascular bundle was then individually isolated and divided. The remaining portion of the muscle was divided with cautery. The leg was then removed and sent off as a specimen to  pathology. The artery nerve and vein were individually divided with 0 silk ties. There are transected proximal to the cut edge of the femur. The wound was then copiously irrigated. Once hemostasis was adequate the fascia was closed with interrupted 2-0 Vicryl sutures. The skin was closed with staples. There were no complications.   Disposition:  To PACU in stable condition.   [name], M.D. [facility] [place] Office: [phone] Pager:  [phone]

## 2012-01-18 NOTE — Progress Notes (Signed)
Family Medicine Teaching Service Attending Note and PCP  I interviewed and examined patient Bowsher and reviewed their tests and x-rays.  I discussed with Dr. Paulina Fusi and reviewed their note for today.  I agree with their assessment and plan.     Additionally  Resting easily after surgery No pain or shortness of breath Does not recognize me  Hopefully the amputation will give her more comfort   Appreciate efforts of in patient team and surgery

## 2012-01-18 NOTE — Progress Notes (Signed)
Patient ZO:XWRUEAVW C Collymore      DOB: 1927-11-28      UJW:[id number]  Reviewed surgical notes.  Will see patient in am and follow up with her niece.   Bryanna Yim L. Ladona Ridgel, MD MBA The Palliative Medicine Team at [facility] Phone: [phone] Pager: [phone]

## 2012-01-18 NOTE — Transfer of Care (Signed)
Immediate Anesthesia Transfer of Care Note  Patient: Mackenzie [name]  Procedure(s) Performed: Procedure(s) (LRB): AMPUTATION ABOVE KNEE (Left)  Patient Location: PACU  Anesthesia Type: General  Level of Consciousness: awake, alert , oriented and sedated  Airway & Oxygen Therapy: Patient Spontanous Breathing and Patient connected to nasal cannula oxygen  Post-op Assessment: Report given to PACU RN, Post -op Vital signs reviewed and stable and Patient moving all extremities  Post vital signs: Reviewed and stable  Complications: No apparent anesthesia complications

## 2012-01-18 NOTE — Progress Notes (Signed)
Room 6732 - Thao Bauza -  [facility] RN Visit  Related admission to [facility] diagnosis of peripheral vascular disease.   Pt isDNR  code.    Pt alert, lying in bed, with no complaints of pain or discomfort at time of visit.  No family present.  Pt states understanding she is going to have surgery to "cut off her leg" but she states "she will still be alive."  Surgery scheduled for today per staff RN Ty - early this afternoon.  [facility] SW advised of plan.    Please call [facility] @ [phone] with any hospice needs.  Thank you.  Joneen Boers, RN  [facility]  Hospice Liaison

## 2012-01-18 NOTE — Progress Notes (Signed)
[facility] [place]  Subjective  -   It was admitted with an ischemic left leg and we have been following. She has had mental status changes. She is in today for left above-knee amputation for a nonviable left extremity is causing her significant amount of pain and has early signs of infection.   Physical Exam:  Ischemic changes to the left leg which is not reconstructable       Assessment/Plan:    the patient scheduled for a left above-knee amputation today. I have discussed with her power of attorney this morning proceed with this operation. We had multiple discussions in the past. She has been evaluated by psychiatry and found to be competent to make her decisions. At this point everybody is on poor to proceed with amputation.   Sevin Langenbach IV, V. WELLS 01/18/2012 1:05 PM --  Filed Vitals:   01/18/12 0955  BP: 159/81  Pulse: 93  Temp: 97.8 F (36.6 C)  Resp: 18   No intake or output data in the 24 hours ending 01/18/12 1305   Laboratory CBC    Component Value Date/Time   WBC 6.3 01/18/2012 0600   HGB 9.7* 01/18/2012 0600   HCT 31.4* 01/18/2012 0600   PLT 364 01/18/2012 0600    BMET    Component Value Date/Time   NA 136 01/18/2012 0600   K 3.0* 01/18/2012 0600   CL 96 01/18/2012 0600   CO2 28 01/18/2012 0600   GLUCOSE 82 01/18/2012 0600   BUN 12 01/18/2012 0600   CREATININE 0.39* 01/18/2012 0600   CREATININE 0.58 05/24/2011 1046   CALCIUM 9.5 01/18/2012 0600   GFRNONAA >90 01/18/2012 0600   GFRAA >90 01/18/2012 0600    COAG Lab Results  Component Value Date   INR 1.09 08/29/2011   INR 0.98 08/15/2011   INR 1.03 07/01/2009   No results found for this basename: PTT    Antibiotics Anti-infectives     Start     Dose/Rate Route Frequency Ordered Stop   01/18/12 0600   cefUROXime (ZINACEF) 1.5 g in dextrose 5 % 50 mL IVPB  Status:  Discontinued        1.5 g 100 mL/hr over 30 Minutes Intravenous On call to O.R. 01/16/12 0913 01/17/12 1405           V. Wells Lakea Mittelman IV, M.D. [facility] [place] Office: [phone] Pager:  [phone]  

## 2012-01-18 NOTE — Anesthesia Procedure Notes (Signed)
Procedure Name: Intubation Date/Time: 01/18/2012 2:01 PM Performed by: Rossie Muskrat L Pre-anesthesia Checklist: Patient identified, Timeout performed, Emergency Drugs available, Suction available and Patient being monitored Patient Re-evaluated:Patient Re-evaluated prior to inductionOxygen Delivery Method: Circle system utilized Preoxygenation: Pre-oxygenation with 100% oxygen Intubation Type: IV induction Ventilation: Mask ventilation without difficulty Laryngoscope Size: Miller and 2 Grade View: Grade I Tube type: Oral Tube size: 7.5 mm Number of attempts: 1 Airway Equipment and Method: Stylet Placement Confirmation: ETT inserted through vocal cords under direct vision,  breath sounds checked- equal and bilateral and positive ETCO2 Secured at: 21 cm Tube secured with: Tape Dental Injury: Teeth and Oropharynx as per pre-operative assessment

## 2012-01-18 NOTE — H&P (View-Only) (Signed)
[facility] [place]  Subjective  -   It was admitted with an ischemic left leg and we have been following. She has had mental status changes. She is in today for left above-knee amputation for a nonviable left extremity is causing her significant amount of pain and has early signs of infection.   Physical Exam:  Ischemic changes to the left leg which is not reconstructable       Assessment/Plan:    the patient scheduled for a left above-knee amputation today. I have discussed with her power of attorney this morning proceed with this operation. We had multiple discussions in the past. She has been evaluated by psychiatry and found to be competent to make her decisions. At this point everybody is on poor to proceed with amputation.   Jalessa Peyser IV, V. WELLS 01/18/2012 1:05 PM --  Filed Vitals:   01/18/12 0955  BP: 159/81  Pulse: 93  Temp: 97.8 F (36.6 C)  Resp: 18   No intake or output data in the 24 hours ending 01/18/12 1305   Laboratory CBC    Component Value Date/Time   WBC 6.3 01/18/2012 0600   HGB 9.7* 01/18/2012 0600   HCT 31.4* 01/18/2012 0600   PLT 364 01/18/2012 0600    BMET    Component Value Date/Time   NA 136 01/18/2012 0600   K 3.0* 01/18/2012 0600   CL 96 01/18/2012 0600   CO2 28 01/18/2012 0600   GLUCOSE 82 01/18/2012 0600   BUN 12 01/18/2012 0600   CREATININE 0.39* 01/18/2012 0600   CREATININE 0.58 05/24/2011 1046   CALCIUM 9.5 01/18/2012 0600   GFRNONAA >90 01/18/2012 0600   GFRAA >90 01/18/2012 0600    COAG Lab Results  Component Value Date   INR 1.09 08/29/2011   INR 0.98 08/15/2011   INR 1.03 07/01/2009   No results found for this basename: PTT    Antibiotics Anti-infectives     Start     Dose/Rate Route Frequency Ordered Stop   01/18/12 0600   cefUROXime (ZINACEF) 1.5 g in dextrose 5 % 50 mL IVPB  Status:  Discontinued        1.5 g 100 mL/hr over 30 Minutes Intravenous On call to O.R. 01/16/12 0913 01/17/12 1405           V. Charlena Cross, M.D. [facility] [place] Office: [phone] Pager:  [phone]

## 2012-01-18 NOTE — Anesthesia Postprocedure Evaluation (Signed)
  Anesthesia Post-op Note  Patient: Mackenzie [name]  Procedure(s) Performed: Procedure(s) (LRB): AMPUTATION ABOVE KNEE (Left)  Patient Location: PACU  Anesthesia Type: General  Level of Consciousness: awake, alert  and oriented  Airway and Oxygen Therapy: Patient Spontanous Breathing and Patient connected to nasal cannula oxygen  Post-op Pain: none  Post-op Assessment: Post-op Vital signs reviewed, Patient's Cardiovascular Status Stable, Respiratory Function Stable, Patent Airway, No signs of Nausea or vomiting and Pain level controlled  Post-op Vital Signs: Reviewed and stable  Complications: No apparent anesthesia complications

## 2012-01-18 NOTE — Anesthesia Preprocedure Evaluation (Addendum)
Anesthesia Evaluation  Patient identified by MRN, date of birth, ID band Patient awake    Reviewed: Allergy & Precautions, H&P , NPO status , Patient's Chart, lab work & pertinent test results  History of Anesthesia Complications Negative for: history of anesthetic complications  Airway Mallampati: II TM Distance: >3 FB Neck ROM: Full    Dental  (+) Poor Dentition, Loose, Missing and Dental Advisory Given   Pulmonary COPD COPD inhaler, former smoker,  breath sounds clear to auscultation  Pulmonary exam normal       Cardiovascular hypertension, Pt. on home beta blockers + CAD and + Peripheral Vascular Disease Rhythm:Regular Rate:Normal  '08 stress myoview: no ischemia, normal LVF '08 ECHO: normal LVF, valves OK   Neuro/Psych PSYCHIATRIC DISORDERS Anxiety Depression negative neurological ROS     GI/Hepatic Neg liver ROS, hiatal hernia, GERD-  Medicated and Controlled,  Endo/Other  Type 2, Oral Hypoglycemic Agents  Renal/GU negative Renal ROS     Musculoskeletal   Abdominal   Peds  Hematology  (+) Blood dyscrasia, anemia ,   Anesthesia Other Findings   Reproductive/Obstetrics                          Anesthesia Physical Anesthesia Plan  ASA: III  Anesthesia Plan: General   Post-op Pain Management:    Induction: Intravenous  Airway Management Planned: Oral ETT  Additional Equipment:   Intra-op Plan:   Post-operative Plan: Extubation in OR  Informed Consent: I have reviewed the patients History and Physical, chart, labs and discussed the procedure including the risks, benefits and alternatives for the proposed anesthesia with the patient or authorized representative who has indicated his/her understanding and acceptance.   Dental advisory given  Plan Discussed with: CRNA and Surgeon  Anesthesia Plan Comments: (Plan routine monitors, GETA)        Anesthesia Quick  Evaluation

## 2012-01-18 NOTE — Progress Notes (Signed)
Patient ID: Mackenzie [name], female   DOB: 05/23/1928, 78 y.o.   MRN: [id number] Discussed in rounds.  Frankly, I am surprised on two counts: 1. That she was deemed competent by psych 2. That she consented to amputation. I am grateful to surgery for their flexibility and being able to do surgery today.  Will follow post op.

## 2012-01-18 NOTE — Progress Notes (Signed)
Clinical Social Worker (CSW) observed psychiatry note that pt has been deemed to have capacity to make her own decisions. CSW will continue to follow and facilitate with discharge planning when pt stable.  Theresia Bough, MSW, Theresia Majors [phone]

## 2012-01-18 NOTE — Progress Notes (Signed)
Patient arrived to floor and received report from nurse at bedside. Patient was alert but slightly confused per baseline, no pain. Patient had no bleeding or complications on assessment of leg. Vitals stable.

## 2012-01-18 NOTE — Interval H&P Note (Signed)
History and Physical Interval Note:  01/18/2012 1:06 PM  Mackenzie [name]  has presented today for surgery, with the diagnosis of Peripheral Vascular Disease with gangrene  The various methods of treatment have been discussed with the patient and family. After consideration of risks, benefits and other options for treatment, the patient has consented to  Procedure(s) (LRB): AMPUTATION ABOVE KNEE (Left) as a surgical intervention .  The patient's history has been reviewed, patient examined, no change in status, stable for surgery.  I have reviewed the patients' chart and labs.  Questions were answered to the patient's satisfaction.     Asiah Befort IV, V. WELLS

## 2012-01-19 ENCOUNTER — Encounter (HOSPITAL_COMMUNITY): Payer: Self-pay | Admitting: Surgery

## 2012-01-19 DIAGNOSIS — I743 Embolism and thrombosis of arteries of the lower extremities: Secondary | ICD-10-CM

## 2012-01-19 LAB — GLUCOSE, CAPILLARY: Glucose-Capillary: 82 mg/dL (ref 70–99)

## 2012-01-19 MED ORDER — MORPHINE SULFATE 2 MG/ML IJ SOLN: 0.5000 mg | INTRAMUSCULAR | Status: DC

## 2012-01-19 MED ORDER — ENSURE COMPLETE PO LIQD: 237.0000 mL | Freq: Two times a day (BID) | ORAL | Status: DC | PRN

## 2012-01-19 MED ORDER — MORPHINE SULFATE 2 MG/ML IJ SOLN: 0.5000 mg | INTRAMUSCULAR | Status: DC | PRN

## 2012-01-19 MED ORDER — POTASSIUM CHLORIDE CRYS ER 20 MEQ PO TBCR
40.0000 meq | EXTENDED_RELEASE_TABLET | Freq: Once | ORAL | Status: AC
  Administered 2012-01-19: 40 meq via ORAL
  Filled 2012-01-19: qty 2

## 2012-01-19 MED ORDER — ENSURE PUDDING PO PUDG
1.0000 | Freq: Three times a day (TID) | ORAL | Status: DC
  Administered 2012-01-19 – 2012-01-20 (×3): 1 via ORAL

## 2012-01-19 MED ORDER — HYDROCODONE-ACETAMINOPHEN 10-325 MG PO TABS
1.0000 | ORAL_TABLET | ORAL | Status: DC
  Administered 2012-01-19 – 2012-01-20 (×3): 1 via ORAL
  Filled 2012-01-19 (×4): qty 1

## 2012-01-19 MED FILL — Hydromorphone HCl Preservative Free (PF) Inj 1 MG/ML: INTRAMUSCULAR | Qty: 1 | Status: AC

## 2012-01-19 NOTE — Addendum Note (Signed)
Addendum  created 01/19/12 0749 by Lacoya Wilbanks E Keaton Beichner, CRNA   Modules edited:Anesthesia Responsible Staff    

## 2012-01-19 NOTE — Addendum Note (Signed)
Addendum  created 01/19/12 0841 by Fransisca Kaufmann, CRNA   Modules edited:Anesthesia Responsible Staff

## 2012-01-19 NOTE — Progress Notes (Addendum)
[facility] [place]  Postoperative Visit - Amputation  Date of Surgery: 01/15/2012 - 01/18/2012 Procedure(s): AMPUTATION ABOVE KNEE Left Surgeon: Surgeon(s): Nada Libman, MD POD: 1 Day Post-Op  Subjective Mackenzie [name] is a 78 y.o. female who is S/P Left Procedure(s): AMPUTATION ABOVE KNEE.  Dressing is in place.  Significant Diagnostic Studies: CBC Lab Results  Component Value Date   WBC 6.3 01/18/2012   HGB 9.7* 01/18/2012   HCT 31.4* 01/18/2012   MCV 78.3 01/18/2012   PLT 364 01/18/2012    BMET    Component Value Date/Time   NA 136 01/18/2012 0600   K 3.0* 01/18/2012 0600   CL 96 01/18/2012 0600   CO2 28 01/18/2012 0600   GLUCOSE 82 01/18/2012 0600   BUN 12 01/18/2012 0600   CREATININE 0.39* 01/18/2012 0600   CREATININE 0.58 05/24/2011 1046   CALCIUM 9.5 01/18/2012 0600   GFRNONAA >90 01/18/2012 0600   GFRAA >90 01/18/2012 0600    COAG Lab Results  Component Value Date   INR 1.09 08/29/2011   INR 0.98 08/15/2011   INR 1.03 07/01/2009   No results found for this basename: PTT     Intake/Output Summary (Last 24 hours) at 01/19/12 1039 Last data filed at 01/19/12 0554  Gross per 24 hour  Intake 2517.5 ml  Output      0 ml  Net 2517.5 ml   Patient Vitals for the past 24 hrs:  Urine Occurrence  01/19/12 0400 1   01/18/12 2241 1      Physical Examination  BP Readings from Last 3 Encounters:  01/19/12 133/53  01/19/12 133/53  12/11/11 141/65   Temp Readings from Last 3 Encounters:  01/19/12 98.4 F (36.9 C) Axillary  01/19/12 98.4 F (36.9 C) Axillary  11/24/11 97.5 F (36.4 C)    SpO2 Readings from Last 3 Encounters:  01/19/12 93%  01/19/12 93%  11/24/11 100%   Pulse Readings from Last 3 Encounters:  01/19/12 101  01/19/12 101  12/11/11 81    Pt is Alert  WDWN female with no complaints  Left amputation wound is clean dry dressing.    Assessment/plan:  Mackenzie [name] is a 78 y.o. female who is s/p Left  Procedure(s): AMPUTATION ABOVE KNEE  The patient's stump is viable.  We will change the dressing tomorrow.  Follow-up 4 weeks from surgery  [name] [facility] 10:39 AM 01/19/[phone]  Agree with above  Durene Cal

## 2012-01-19 NOTE — Progress Notes (Signed)
Hospice and Palliative Care Social Worker visited with the patient in her room.  She was alert with confusion thinking her amputation did not happen yet.  She denied any pain and fell asleep at the end of the visit.  Will call niece with an update.  Discharge goal is to return to [facility] when she is medically stable.  Please call [facility] @ [phone] with any discharge or hospice needs.  Thank You.  Laurin Coder, LCSW 01/19/12 2:03pm

## 2012-01-19 NOTE — Addendum Note (Signed)
Addendum  created 01/19/12 0841 by Kerensa Nicklas E Chanele Douglas, CRNA   Modules edited:Anesthesia Responsible Staff    

## 2012-01-19 NOTE — Progress Notes (Signed)
Family Medicine Teaching Service Attending Note  I interviewed and examined patient Mackenzie [name] and reviewed their tests and x-rays.  I discussed with Dr. Paulina Fusi and reviewed their note for today.  I agree with their assessment and plan.     Additionally  Sleepy this AM but not in pain Continue hospice comfort care

## 2012-01-19 NOTE — Progress Notes (Signed)
This was a follow-up visit. Pt's RN told me that pt was sad this morning.  When I entered Pt was confused and disoriented.  She told me that her amputation had not happened yet.  I provided a comforting presence and tried to offer a source of comfort.  Please page me if further assistance is required. Dellie Catholic  [phone] personal pager

## 2012-01-19 NOTE — Progress Notes (Signed)
PGY-1 Daily Progress Note Family Medicine Teaching Service [place] R. Adithya Difrancesco, DO Service Pager: [phone]   Subjective: Pt seen and examined at bedside.  Yesterday, pt went for her AKA of the L leg and the surgery had no complications.  This morning, pt does not c/o worsening pain and does not have CP, SOB, or increased warmth at amputation site.  Objective:  VITALS Temp:  [97.2 F (36.2 C)-99.2 F (37.3 C)] 99.2 F (37.3 C) (07/19 0514) Pulse Rate:  [92-107] 101  (07/19 0947) Resp:  [10-18] 18  (07/19 0514) BP: (102-166)/(53-81) 133/53 mmHg (07/19 0947) SpO2:  [93 %-100 %] 93 % (07/19 0514) FiO2 (%):  [2 %] 2 % (07/18 1615)  In/Out  Intake/Output Summary (Last 24 hours) at 01/19/12 0953 Last data filed at 01/19/12 0554  Gross per 24 hour  Intake 2517.5 ml  Output      0 ml  Net 2517.5 ml    Physical Exam: Gen:  Sleeping pleasantly, in NAD HEENT: slightly dry CV: Regular rate and rhythm, no murmurs rubs or gallops PULM: clear to auscultation bilaterally. No wheezes/rales/rhonchi ABD: soft/nontender/nondistended/normal bowel sounds EXT: + for AKA amputation L leg, which is wrapped today.   Neuro: Alert at times, oriented to place but not person or time Psych: Wax and [place]ane mental status  MEDS Scheduled Meds:    . donepezil  10 mg Oral QHS  . DULoxetine  40 mg Oral Daily  . enoxaparin (LOVENOX) injection  40 mg Subcutaneous Q24H  . hydrochlorothiazide  25 mg Oral Daily  . insulin aspart  0-9 Units Subcutaneous TID WC  . metoprolol  50 mg Oral BID  . mirtazapine  15 mg Oral QHS  .  morphine injection  0.5 mg Intravenous Q4H  . nitroGLYCERIN  0.3 mg Transdermal Daily  . potassium chloride  40 mEq Oral Once  . risperiDONE  0.5 mg Oral QHS  . DISCONTD: morphine  15 mg Oral BID   Continuous Infusions:    . sodium chloride 75 mL/hr at 01/19/12 0554   PRN Meds:.HYDROcodone-acetaminophen, morphine injection, DISCONTD: 0.9 % irrigation (POUR BTL), DISCONTD: bacitracin,  DISCONTD:  HYDROmorphone (DILAUDID) injection, DISCONTD: meperidine (DEMEROL) injection, DISCONTD: midazolam, DISCONTD: promethazine, DISCONTD: traMADol  Labs and imaging:   CBC  Lab 01/18/12 0600 01/16/12 0540 01/15/12 1610  WBC 6.3 7.2 9.5  HGB 9.7* 11.2* 11.7*  HCT 31.4* 35.7* 36.9  PLT 364 422* 417*   BMET/CMET  Lab 01/18/12 0600 01/16/12 0540 01/15/12 1610  NA 136 138 136  K 3.0* 3.8 4.8  CL 96 96 95*  CO2 28 29 27   BUN 12 29* 31*  CREATININE 0.39* 0.51 0.55  CALCIUM 9.5 10.4 10.3  PROT -- -- --  BILITOT -- -- --  ALKPHOS -- -- --  ALT -- -- --  AST -- -- --  GLUCOSE 82 107* 115*   Results for orders placed during the hospital encounter of 01/15/12 (from the past 24 hour(s))  GLUCOSE, CAPILLARY     Status: Normal   Collection Time   01/18/12 11:59 AM      Component Value Range   Glucose-Capillary 89  70 - 99 mg/dL  GLUCOSE, CAPILLARY     Status: Normal   Collection Time   01/18/12  3:20 PM      Component Value Range   Glucose-Capillary 93  70 - 99 mg/dL   Comment 1 Notify RN    GLUCOSE, CAPILLARY     Status: Abnormal   Collection Time  01/18/12  5:13 PM      Component Value Range   Glucose-Capillary 105 (*) 70 - 99 mg/dL  GLUCOSE, CAPILLARY     Status: Normal   Collection Time   01/18/12  9:57 PM      Component Value Range   Glucose-Capillary 72  70 - 99 mg/dL   Comment 1 Notify RN    GLUCOSE, CAPILLARY     Status: Normal   Collection Time   01/19/12  7:49 AM      Component Value Range   Glucose-Capillary 86  70 - 99 mg/dL   Dg Chest 2 View  10/09/8117  IMPRESSION: No acute abnormality.  Please see above.  Original Report Authenticated By: Mackenzie [name], M.D.   Ct Head Wo Contrast  01/15/2012   IMPRESSION: 1.  No acute intracranial abnormalities. 2.  Cerebral and cerebellar atrophy with mild chronic microvascular ischemic changes in the cerebral white matter and old infarction in the left sub insular region, similar to prior examination 02/01/2011.   Original Report Authenticated By: Mackenzie [name], M.D.   Dg Foot Complete Left  01/15/2012   IMPRESSION:  1.  No acute osseous abnormality. 2.  Mild first metatarsal phalangeal joint osteoarthritis.  Original Report Authenticated By: Mackenzie [name], M.D.    Assessment  Mackenzie [name] is a 78 y.o.  female presenting with AMS absent with our evaluation and left LE dry gangrene. Pt is in Hospice Care due to multiple end stage comorbidities.       Plan     1. Dry gangrene of LLE: Pt with multiple vascular f/u and intervention due to PAD ( she has history of bypass graft axilla-bifemoral on the right). On today's Vascular Surgery evaluation the recommendation is above knee amputation.   POA Mackenzie [name] [phone])   1) Vascular surgery took pt to the OR yesterday for L AKA.  During the procedure, no complications were noted, and pt was extubated and transferred back to her room.  Was started on Morphine 2 mg q 4 hr IV PRN and only required one dose at 3AM.  Will continue to follow vascular recommendations before d/c back to SNF with hospice placement.  CSW consulted to help with this process when the time comes.  Still c/o pain and will make Norco q 4 hrs with Morphine prn q 4 hrs.  2) Specimen sent to pathology lab for examination for possible infection and need for future ABx.   2. AMS: not present at the time of examination. Could be the change on narcotic medication. Pt is in several psychotropics agents and a meticulous review is warrant.   Pt is AAO x 3 when discussing at bedside.  Pt competent according to psych, and her presentation waxes and wanes.  During her hospital stay, pt has been AAO x 3 with evidence of capacity at other times is not oriented to herself, place, or time and is hysterical.  Most likely secondary to dementia from vascular sources or Alzheimer's.  3.Pre-renal azotemia - Elevated solitary BUN at 29.  Most likely stemming from dehydration as not started on  fluids upon admission.  Could also be contributing to her AMS.  Will start on carb modified diet and NS @ 47ml/hr.   4. HTN: will continue ambulatory regimen with lopressor and HCTZ    5. DM: Will cover with SSI and adjust therapy with lantus if needed. Metformin held.  Started on carb modified diet.  Has been well controlled  in the hospital stay so far.  6. CAD: pt will continue ambulatory treatment with Nitro patch start in am and removed before bedtime.   7. Sacral decubital ulcer: wound care consult. Pt pain regimen changed to IV PRN morphine 2 mg q 4 hrs as needed with norco 10 mg scheduled 4 hrs.  8. Calcaneal Ulcer: resolved with AKA L foot    FEN/GI: Regular diet thin. NS @ 75/hr Prophylaxis:  Heparin SQ Disposition: Pt on hospice.  When medically stable from Vascular standpoint, will be going back to [facility] with Hospice.   Twana First Marinus Eicher DO, FMTS PGY-1

## 2012-01-19 NOTE — Progress Notes (Signed)
Room 6732 - Mackenzie [name] -  [facility] RN Visit  Related admission to [facility] diagnosis of peripheral vascular disease.  Pt is DNR code.    Pt arousable and oriented to self, lying in bed, with no complaints of pain or discomfort.    No family present.  Pt's lunch was sitting in front of her - she indicated she did want to try and eat.  NT Mardelle Matte went to assist pt with her lunch.  Please call [facility] @ [phone] with disposition plans if any over the weekend and any hospice needs.  Thank you.  Joneen Boers, RN  [facility]  Hospice Liaison

## 2012-01-19 NOTE — Addendum Note (Signed)
Addendum  created 01/19/12 0749 by Fransisca Kaufmann, CRNA   Modules edited:Anesthesia Responsible Staff

## 2012-01-19 NOTE — Progress Notes (Signed)
INITIAL ADULT NUTRITION ASSESSMENT Date: 01/19/2012   Time: 10:09 AM  Reason for Assessment: Low Braden  ASSESSMENT: Female 78 y.o.  Dx: ischemic left leg  Hx:  Past Medical History  Diagnosis Date  . Delirium 12/10/08    hospitalized  . Pelvic fracture 07/01/09    hospitalized  . Coronary artery disease   . Femoral neck fracture   . Anemia   . Hypertension   . Diabetes mellitus   . Dementia   . GI bleed   . Microcytic anemia   . Diverticulosis   . Compression fracture   . Gastric ulcer   . AVM (arteriovenous malformation)     of rectum  . Gastroparesis   . Hiatal hernia 1997  . Esophageal stricture 1997  . Anxiety   . Depression   . GERD (gastroesophageal reflux disease)   . Gout   . Psoriasis   . DJD (degenerative joint disease)   . Vascular disease   . Fall at nursing home 12-10-2011    patient states she fell in her room at nursing home last night, bruising and excoriations noted  on both arms    Past Surgical History  Procedure Date  . Cholecystectomy   . Cataract extraction   . Axillary-femoral bypass graft 08/15/2011    Procedure: BYPASS GRAFT AXILLA-BIFEMORAL;  Surgeon: Mackenzie China, MD;  Location: [facility] OR;  Service: Vascular;  Laterality: Right;  Embolectomy of Right Femoral Artery with Right  Axilla Femoral Bypass Graft.  . Total hip arthroplasty     left  . Hemorrhoid surgery   . Incisional hernia repair   . Aortobifemoral bypass graft 10/12/1992  . Common iliac thrombectomy 10/12/1992    with right profunda femoris embolectomy and left popliteal artery transfemoral embolectomy`    Related Meds:     . donepezil  10 mg Oral QHS  . DULoxetine  40 mg Oral Daily  . enoxaparin (LOVENOX) injection  40 mg Subcutaneous Q24H  . hydrochlorothiazide  25 mg Oral Daily  . HYDROcodone-acetaminophen  1 tablet Oral Q4H  . insulin aspart  0-9 Units Subcutaneous TID WC  . metoprolol  50 mg Oral BID  . mirtazapine  15 mg Oral QHS  . nitroGLYCERIN  0.3 mg  Transdermal Daily  . potassium chloride  40 mEq Oral Once  . risperiDONE  0.5 mg Oral QHS  . DISCONTD: morphine  15 mg Oral BID  . DISCONTD:  morphine injection  0.5 mg Intravenous Q4H   Ht: 5\' 7"  (170.2 cm)  Wt: 143 lb 8.3 oz (65.1 kg)  Ideal Wt: 61.4 kg % Ideal Wt: 106%  Wt Readings from Last 15 Encounters:  01/17/12 143 lb 8.3 oz (65.1 kg)  01/17/12 143 lb 8.3 oz (65.1 kg)  12/11/11 154 lb (69.854 kg)  11/24/11 154 lb 1.6 oz (69.9 kg)  09/13/11 165 lb 12.6 oz (75.2 kg)  09/04/11 167 lb 15.9 oz (76.2 kg)  08/29/11 155 lb (70.308 kg)  08/15/11 170 lb 10.2 oz (77.4 kg)  08/15/11 170 lb 10.2 oz (77.4 kg)  08/15/11 170 lb 10.2 oz (77.4 kg)  05/24/11 174 lb (78.926 kg)  02/20/11 169 lb (76.658 kg)  01/11/09 184 lb (83.462 kg)  12/14/08 186 lb 1.6 oz (84.414 kg)  04/09/08 182 lb (82.555 kg)  Usual Wt: 165 - 170 lb % Usual Wt: 85%; 15% wt change x 4 months  Body mass index is 22.48 kg/(m^2). Weight is WNL  Food/Nutrition Related Hx: from SNF with hospice care  Labs:  CMP     Component Value Date/Time   NA 136 01/18/2012 0600   K 3.0* 01/18/2012 0600   CL 96 01/18/2012 0600   CO2 28 01/18/2012 0600   GLUCOSE 82 01/18/2012 0600   BUN 12 01/18/2012 0600   CREATININE 0.39* 01/18/2012 0600   CREATININE 0.58 05/24/2011 1046   CALCIUM 9.5 01/18/2012 0600   PROT 6.3 11/21/2011 0510   ALBUMIN 2.1* 11/21/2011 0510   AST 18 11/21/2011 0510   ALT 12 11/21/2011 0510   ALKPHOS 308* 11/21/2011 0510   BILITOT 0.2* 11/21/2011 0510   GFRNONAA >90 01/18/2012 0600   GFRAA >90 01/18/2012 0600    Intake/Output Summary (Last 24 hours) at 01/19/12 1019 Last data filed at 01/19/12 0554  Gross per 24 hour  Intake 2517.5 ml  Output      0 ml  Net 2517.5 ml    Diet Order: Carb Control  Supplements/Tube Feeding: none  IVF:    sodium chloride Last Rate: 75 mL/hr at 01/19/12 0554    Estimated Nutritional Needs:   Kcal: 1350 - 1550 kcal Protein:  75 - 85 grams Fluid:  at least 1.8 liters  daily  Admitted with L foot lesion and AMS. Pt underwent L AKA yesterday.  RD reviewed SNF records, pt receiving Remeron at SNF. No supplements noted.  Note recent wt loss of 15% x 4 months. Pt is at risk for malnutrition given recent wt loss; expected with Hospice status. Unable to obtain information from pt regarding intake PTA. No family at bedside. Pt is currently eating very poorly.  NUTRITION DIAGNOSIS: -Inadequate oral intake (NI-2.1).  Status: Ongoing  RELATED TO: poor appetite and dementia  AS EVIDENCE BY: recent wt loss and current inadequate PO intake  MONITORING/EVALUATION(Goals): Goal: Interventions within goals of care.  Monitor: weights, labs, PO intake, I/O's  EDUCATION NEEDS: -Education not appropriate at this time  INTERVENTION: 1. Ensure Pudding po TID, each supplement provides 170 kcal and 4 grams of protein.  2. Ensure Complete po BID PRN, each supplement provides 350 kcal and 13 grams of protein. 3. Recommend diet liberalization to Regular 4. RD to continue to follow nutrition care plan   DOCUMENTATION CODES Per approved criteria  -Not Applicable    Mackenzie Motto MS, RD, LDN Pager: [phone] After-hours pager: [phone]

## 2012-01-20 LAB — GLUCOSE, CAPILLARY
Glucose-Capillary: 153 mg/dL — ABNORMAL HIGH (ref 70–99)
Glucose-Capillary: 82 mg/dL (ref 70–99)

## 2012-01-20 MED ORDER — ENSURE PUDDING PO PUDG: 1.0000 | Freq: Three times a day (TID) | ORAL | Status: AC

## 2012-01-20 MED ORDER — ENSURE COMPLETE PO LIQD: 237.0000 mL | Freq: Two times a day (BID) | ORAL | Status: AC | PRN

## 2012-01-20 NOTE — Progress Notes (Signed)
Patient ZO:[id number] C Dinan      DOB: 10-Sep-1927      UJW:[id number]  Post operative followup visit was undertaken late this evening. Patient was awake alert a little tearful but not as agitated as she had been prior to surgery. I have not spoken with her power of attorney since surgery and will attempt to do so in the a.m. The patient appears comfortable still with intermittent confusion. Pain appears to be controlled at this time. Please note that the patient was evaluated by psychiatry and found to have the capacity to make this individual decision. This does not reflect on her competency, nor does it mean that she has full capacity all of the time. The patient does have known dementia,  on previous discussions with her power of attorney she showed clear understanding of the consequences of amputation versus non-amputation. She was able to reiterate those thoughts to psychiatry and to myself, which is the minimal basis for capacity. The decision to allow her to make the decision was actually made in conjunction with her power of attorney.  Vital signs were stable. She was in no respiratory distress during my visit.  Loren Vicens L. Ladona Ridgel, MD MBA The Palliative Medicine Team at [facility] Phone: [phone] Pager: [phone]

## 2012-01-20 NOTE — Progress Notes (Signed)
CSW paged MD x2 due to no dc summ/dc order at this time.  This CSW not available after 430pm today. D/C packet complete with signed FL2 and chart copy.  If MD discharges pt after 430pm, the following will need to be completed in order for transfer to SNF: --Fax d/c summary to [facility] (fax cover sheet on chart with correct #) --Call report to [facility] RN [phone] --Call ambulance (form complete and on chart along with contact #) --Call pt's niece, Larita Fife, when pt leaves [facility] --Call pt's niece, Larita Fife [phone], if pt does not leave so she will know not to meet pt at [facility]. If pt still here tomorrow, 7/21, CSW will assist with d/c. Dellie Burns, MSW, [place] [phone] (weekend)

## 2012-01-20 NOTE — Progress Notes (Signed)
Per MD, pt stable for transfer to [facility] today via [facility]. CSW notified [facility] of d/c and they will visit pt at [facility] tomorrow. CSW left voicemail for pt's niece, Larita Fife [phone], requesting return call.  Will continue to attempt contact with niece. D/C packet complete. CSW signing off as no other CSW needs identified. Dellie Burns, MSW, [place] [phone] (weekend)

## 2012-01-20 NOTE — Progress Notes (Signed)
Patient admitted with gangrenous left lower extremity, s/p left AKA.  DNR on chart.  Found patient sleeping, FLACC score 0.  No family present.  Reviewed chart and patient has been stable since her left AKA 2 days ago, with pain well controlled.  Continue current plan of care and plan to discharge back to SNF with Hospice services.  Encourage a call to Hospice at [phone] with any needs.  Charlott Holler, RN, BSN Hospice

## 2012-01-20 NOTE — Progress Notes (Signed)
Seen and examined.    Agree with management by Dr. Paulina Fusi.

## 2012-01-20 NOTE — Progress Notes (Signed)
Patient ID: Mackenzie [name], female   DOB: 1927/11/18, 78 y.o.   MRN: [id number] Vascular Surgery Progress Note  Subjective: 2 days post left AKA  Objective:  Filed Vitals:   01/20/12 0423  BP: 149/46  Pulse: 102  Temp: 98.3 F (36.8 C)  Resp: 18    Patient confused and disoriented Lungs no rhonchi or wheezing Left AKA stump examined. Healing satisfactorily with some bluish discoloration in center of posterior flap no evidence of infection   Labs:  Lab 01/18/12 0600 01/16/12 0540 01/15/12 1610  CREATININE 0.39* 0.51 0.55    Lab 01/18/12 0600 01/16/12 0540 01/15/12 1610  NA 136 138 136  K 3.0* 3.8 4.8  CL 96 96 95*  CO2 28 29 27   BUN 12 29* 31*  CREATININE 0.39* 0.51 0.55  LABGLOM -- -- --  GLUCOSE 82 -- --  CALCIUM 9.5 10.4 10.3    Lab 01/18/12 0600 01/16/12 0540 01/15/12 1610  WBC 6.3 7.2 9.5  HGB 9.7* 11.2* 11.7*  HCT 31.4* 35.7* 36.9  PLT 364 422* 417*   No results found for this basename: INR:3 in the last 168 hours  I/O last 3 completed shifts: In: 2550 [I.V.:2550] Out: -   Imaging: No results found.  Assessment/Plan:  POD #2  LOS: 5 days  s/p Procedure(s): AMPUTATION ABOVE KNEE  Left AKA healing satisfactorily We'll continue to follow with you   Josephina Gip, MD 01/20/2012 9:13 AM

## 2012-01-20 NOTE — Progress Notes (Signed)
Mackenzie [name] ,RN at [facility], called report. No questions at this time. Received D/C summary. Ambulance called. IV x 2 d/c'd.

## 2012-01-20 NOTE — Progress Notes (Signed)
PGY-1 Daily Progress Note Family Medicine Teaching Service [place] R. Ralph Benavidez, DO Service Pager: [phone]   Subjective: Pt seen and examined at bedside.  Pt has had AKA two days ago and is not in pain currently.  This morning, pt does not c/o worsening pain and does not have CP, SOB, or increased warmth at amputation site.  Objective:  VITALS Temp:  [98.3 F (36.8 C)-99.4 F (37.4 C)] 98.3 F (36.8 C) (07/20 0423) Pulse Rate:  [101-113] 102  (07/20 0423) Resp:  [16-18] 18  (07/20 0423) BP: (106-149)/(37-53) 149/46 mmHg (07/20 0423) SpO2:  [93 %-95 %] 94 % (07/20 0423) Weight:  [139 lb 12.4 oz (63.4 kg)] 139 lb 12.4 oz (63.4 kg) (07/19 2116)  In/Out  Intake/Output Summary (Last 24 hours) at 01/20/12 0835 Last data filed at 01/20/12 0300  Gross per 24 hour  Intake 1582.5 ml  Output      0 ml  Net 1582.5 ml    Physical Exam: Gen:  Sleeping pleasantly, in NAD HEENT: slightly dry CV: Regular rate and rhythm, no murmurs rubs or gallops PULM: clear to auscultation bilaterally. No wheezes/rales/rhonchi ABD: soft/nontender/nondistended/normal bowel sounds EXT: + for AKA amputation L leg, which is wrapped today.   Neuro: Alert at times, More oriented than yesterday Psych: Wax and Wane mental status  MEDS Scheduled Meds:    . donepezil  10 mg Oral QHS  . DULoxetine  40 mg Oral Daily  . enoxaparin (LOVENOX) injection  40 mg Subcutaneous Q24H  . feeding supplement  1 Container Oral TID BM  . hydrochlorothiazide  25 mg Oral Daily  . HYDROcodone-acetaminophen  1 tablet Oral Q4H  . insulin aspart  0-9 Units Subcutaneous TID WC  . metoprolol  50 mg Oral BID  . mirtazapine  15 mg Oral QHS  . nitroGLYCERIN  0.3 mg Transdermal Daily  . potassium chloride  40 mEq Oral Once  . risperiDONE  0.5 mg Oral QHS  . DISCONTD:  morphine injection  0.5 mg Intravenous Q4H   Continuous Infusions:    . sodium chloride 75 mL/hr at 01/19/12 2108   PRN Meds:.feeding supplement, morphine  injection, DISCONTD: HYDROcodone-acetaminophen  Labs and imaging:   CBC  Lab 01/18/12 0600 01/16/12 0540 01/15/12 1610  WBC 6.3 7.2 9.5  HGB 9.7* 11.2* 11.7*  HCT 31.4* 35.7* 36.9  PLT 364 422* 417*   BMET/CMET  Lab 01/18/12 0600 01/16/12 0540 01/15/12 1610  NA 136 138 136  K 3.0* 3.8 4.8  CL 96 96 95*  CO2 28 29 27   BUN 12 29* 31*  CREATININE 0.39* 0.51 0.55  CALCIUM 9.5 10.4 10.3  PROT -- -- --  BILITOT -- -- --  ALKPHOS -- -- --  ALT -- -- --  AST -- -- --  GLUCOSE 82 107* 115*   Results for orders placed during the hospital encounter of 01/15/12 (from the past 24 hour(s))  GLUCOSE, CAPILLARY     Status: Normal   Collection Time   01/19/12 12:25 PM      Component Value Range   Glucose-Capillary 88  70 - 99 mg/dL  GLUCOSE, CAPILLARY     Status: Abnormal   Collection Time   01/19/12  5:02 PM      Component Value Range   Glucose-Capillary 153 (*) 70 - 99 mg/dL  GLUCOSE, CAPILLARY     Status: Normal   Collection Time   01/19/12  9:20 PM      Component Value Range   Glucose-Capillary 82  70 - 99 mg/dL  GLUCOSE, CAPILLARY     Status: Abnormal   Collection Time   01/20/12  7:29 AM      Component Value Range   Glucose-Capillary 153 (*) 70 - 99 mg/dL   Dg Chest 2 View  1/61/0960  IMPRESSION: No acute abnormality.  Please see above.  Original Report Authenticated By: Fuller Canada, M.D.   Ct Head Wo Contrast  01/15/2012   IMPRESSION: 1.  No acute intracranial abnormalities. 2.  Cerebral and cerebellar atrophy with mild chronic microvascular ischemic changes in the cerebral white matter and old infarction in the left sub insular region, similar to prior examination 02/01/2011.  Original Report Authenticated By: Florencia Reasons, M.D.   Dg Foot Complete Left  01/15/2012   IMPRESSION:  1.  No acute osseous abnormality. 2.  Mild first metatarsal phalangeal joint osteoarthritis.  Original Report Authenticated By: Reyes Ivan, M.D.    Assessment  Mackenzie [name] is a 78 y.o.  female presenting with AMS absent with our evaluation and left LE dry gangrene. Pt is in Hospice Care due to multiple end stage comorbidities.       Plan     1. Dry gangrene of LLE: Pt with multiple vascular f/u and intervention due to PAD ( she has history of bypass graft axilla-bifemoral on the right). On today's Vascular Surgery evaluation the recommendation is above knee amputation.   POA Mackenzie [name] [phone])   1) Vascular surgery took pt for AKA L leg above the knee.  Pt is being followed by vascular and when medically stable can be sent to [facility].  Pain well controlled at this point (Norco 10 mg q 4 hrs, Morphine 2 mg q 4 hrs PRN)  2) Specimen sent to pathology lab for examination for possible infection and need for future ABx.  3) Will need f/u with surgery in 4 weeks   2. AMS: not present at the time of examination. Could be the change on narcotic medication. Pt is in several psychotropics agents and a meticulous review is warrant.   Pt is AAO x 3 when discussing at bedside.  Pt competent according to psych, and her presentation waxes and wanes.  During her hospital stay, pt has been waxing and wanning  Most likely secondary to dementia from vascular sources or Alzheimer's.  3.Pre-renal azotemia - Resolved upon giving fluid.  4. HTN: will continue ambulatory regimen with lopressor and HCTZ    5. DM: Will cover with SSI and adjust therapy with lantus if needed. Metformin held.  Started on carb modified diet.  Has been well controlled in the hospital stay so far.  6. CAD: pt will continue ambulatory treatment with Nitro patch start in am and removed before bedtime.   7. Sacral decubital ulcer: wound care consult. Pt pain regimen changed to IV PRN morphine 2 mg q 4 hrs as needed with norco 10 mg scheduled 4 hrs.  8. Calcaneal Ulcer: resolved with AKA L foot    FEN/GI: Regular diet thin. NS @ 75/hr Prophylaxis:  Heparin SQ Disposition: Pt on  hospice.  When medically stable from Vascular standpoint, will be going back to [facility] with Hospice.   Mackenzie First Antiono Ettinger DO, FMTS PGY-1

## 2012-01-21 NOTE — Discharge Summary (Signed)
Seen and examined on 7/20.  Agree with DC as outlined by Dr. Mikel Cella

## 2012-01-25 ENCOUNTER — Telehealth: Payer: Self-pay | Admitting: Surgery

## 2012-01-25 NOTE — Telephone Encounter (Addendum)
Message copied by Shari Prows on Thu Jan 25, 2012  3:41 PM ------      Message from: Melene Plan      Created: Thu Jan 25, 2012  2:53 PM      Regarding: RE: post op appt       I have sent all the PAs this message with a note from me. Amputations need to be seen in 4 weeks with staple removal.      Thanks, Darel Hong      ----- Message -----         From: Shari Prows         Sent: 01/25/2012   1:11 PM           To: Melene Plan, RN      Subject: post op appt                                             Jeri Modena from [facility] called me requesting an appt for post op after a l leg aka on 01/18/12 w/ vwb, I didn't see a discharge order in epic and no appt has been scheduled. Please let me know when I should schedule an appt for this patient and I will notify Jeri Modena. Thanks, Drinda Butts        I scheduled an appt for the above patient for 02/12/12 at 10:45am. I spoke w/ Jeri Modena @ [facility] regarding this and also mailed an appt letter to pt's home for family to be notified. He stated he would also notify pt's family members. awt

## 2012-02-05 ENCOUNTER — Ambulatory Visit: Payer: Medicare Other | Admitting: Family Medicine

## 2012-02-07 ENCOUNTER — Ambulatory Visit: Payer: Medicare Other | Admitting: Family Medicine

## 2012-02-09 ENCOUNTER — Encounter: Payer: Self-pay | Admitting: Surgery

## 2012-02-12 ENCOUNTER — Ambulatory Visit (INDEPENDENT_AMBULATORY_CARE_PROVIDER_SITE_OTHER): Payer: Medicare Other | Admitting: Surgery

## 2012-02-12 ENCOUNTER — Encounter: Payer: Self-pay | Admitting: Surgery

## 2012-02-12 VITALS — BP 137/40 | HR 101 | Temp 98.2°F | Ht 67.0 in | Wt 139.0 lb

## 2012-02-12 DIAGNOSIS — I70219 Atherosclerosis of native arteries of extremities with intermittent claudication, unspecified extremity: Secondary | ICD-10-CM | POA: Insufficient documentation

## 2012-02-12 NOTE — Progress Notes (Signed)
The patient is status post left above-knee amputation on 01/18/2012. This was done for an ischemic left leg with severe pain. She is back today for followup. Her incision seems to be healing nicely. There is a small amount of drainage from the midportion of the wound. Her staples were removed today. I suspect that she will not have any difficulty getting this to heal. She may need to have a little packing in the midportion towards the draining if this area opens up. She will follow up on a when necessary basis.

## 2012-04-02 DEATH — deceased

## 2012-10-03 ENCOUNTER — Encounter: Payer: Self-pay | Admitting: Internal Medicine

## 2014-06-11 ENCOUNTER — Encounter (HOSPITAL_COMMUNITY): Payer: Self-pay | Admitting: Surgery

## 2014-10-25 NOTE — Consult Note (Signed)
PATIENT NAME:  Mackenzie [name], Mackenzie [name] MR#:  [id number] DATE OF BIRTH:  25-Mar-1928  DATE OF CONSULTATION:  07/30/2011  REFERRING PHYSICIAN:   CONSULTING PHYSICIAN:  Mackenzie Caper, MD  PRIMARY CARE PHYSICIAN:  [facility]  HISTORY OF PRESENT ILLNESS:  The patient is an 81 year old Caucasian female with past medical history significant for history of hypertension, diabetes mellitus for more than 10 years, no history of coronary artery disease, history of hyperlipidemia, dementia, and questionable anxiety and depression who presented to the hospital after she fell down earlier today. Apparently she got up to go to the bathroom and when she returned and was about to get in bed  she fell down. She denies any loss of consciousness remembers everything, even her fall. She is not sure if she tripped or just kind of lost her balance and landed on her left hip. Now she is complaining of severe left hip pain. She already had 75 mcg of fentanyl as well as 4 of morphine and she still is in severe pain.  Her blood pressure is also noted to be severely elevated above 200, and hospitalist services were contacted for management of her blood pressure as well as her medical problems and evaluation for surgery because left hip x-ray revealed subcapital femoral neck fracture.  PAST MEDICAL HISTORY:  1. Hypertension. 2. Hyperlipidemia. 3. History of diabetes mellitus for more than 10 years. 4. No history of coronary artery disease diagnosed.   5. Based on the medication list she is also demented. 6. History of anxiety and depression.   MEDICATIONS: Per medical records she is on: 1. Donepezil 5 mg p.o. daily.  2. HCTZ 25 mg p.o. daily.  3. Metformin 1 gram twice daily.  4. Metoprolol 50 mg p.o. twice daily.  5. Simvastatin 40 mg p.o. daily.  6. Zoloft 50 mg p.o. daily.   PAST SURGICAL HISTORY: 1. Gallbladder surgery.  2. Aorta surgery more than 10 years ago. Unclear what kind of surgery she  had, but it sounds like she had abdominal aortic aneurysm surgery.   ALLERGIES: None.   FAMILY HISTORY: Negative for early coronary artery disease, cerebrovascular accidents, or cancers. The patient's mother lived to the age of 51. The patient's father had arthritis and kidney stones. He also died at an old age.   SOCIAL HISTORY: The patient is widowed. She lives alone now. She was thinking about going to an assisted living facility but now since her husband is dead she did not have chance yet to do that. Her husband died in 05/23/2011.  She used to smoke and smoked one to one and a half packs per day for 20 years but quit 10 years ago. Does not drink any alcohol. She was a housewife. She had two children to take care of; she raised a daughter and a son.  Her daughter lives in [place] and her son is in [place].   REVIEW OF SYSTEMS: Positive for pains in her left hip, also intermittent constipation for which she uses Metamucil. She also has a walker in the house and that is how she ambulates.  She denies any fevers, chills, fatigue, weakness, weight loss or gain. EYES: No problems with blurry vision, double vision, glaucoma, or cataracts. ENT: Denies any tinnitus, allergies, epistaxis, sinus pain, dentures, or difficulty swallowing. RESPIRATORY: Denies any cough, wheezes, asthma, or chronic obstructive pulmonary disease. CARDIOVASCULAR: Denies chest pains, orthopnea, edema, arrhythmias, palpitations, or syncope.  She tells me that she was  never  investigated for possible cardiovascular disease. No myocardial infarction.  No chest pains recently when she ambulates. GI: Denies nausea, vomiting, or diarrhea.  She admits to constipation. GU: Denies dysuria, hematuria, frequency, or incontinence. ENDOCRINE: Denies polydipsia, nocturia, thyroid problems, heat or cold intolerance, or thirst. HEMATOLOGIC: Denies anemia, easy bruising, bleeding, or swollen glands. SKIN: Denies any acne, rashes, lesions, or  change in moles. MUSCULOSKELETAL: Denies arthritis, cramps, or swelling. Admits walking with a walker in the house. NEUROLOGIC: Denies numbness, epilepsy, or tremor. PSYCH: Denies anxiety or insomnia. She is in significant distress now and she is not able to provide much history.   PHYSICAL EXAMINATION: VITAL SIGNS: Her blood pressure was elevated when she arrived in the Emergency Room,  blood pressure 202/88. However, as soon as the Foley catheter was placed and her urine tension was relieved, her blood pressure improved to 136/90 according to the nurse in the Emergency Room.  Her pulse remains somewhat elevated at 100 to 104. Her temperature is 98, respiration rate 20. Oxygen saturation was 94% on room air.   GENERAL: This is a well developed, obese Caucasian female in moderate to significant distress now lying on the right hip area, while nursing staff is trying to place a Foley catheter. She is in significant discomfort because of left hip pain.  HEENT:  Pupils equal, round, reactive to light.  Extraocular movements intact.  No icterus or conjunctivitis. Has normal hearing. No pharyngeal erythema. Mucosa is dry.  NECK:  Neck did not reveal any masses. Supple, nontender. Thyroid is not enlarged. No adenopathy. No JVD or carotid bruits bilaterally. Full range of motion.   LUNGS: Clear to auscultation in all fields. No rales, rhonchi, diminished breath sounds, or wheezing. No labored inspirations, increased effort, dullness to percussion, or overt respiratory distress.   CARDIOVASCULAR: S1, S2 appreciated. No murmurs, gallops, or rubs noted. PMI not lateralized. Chest is nontender to palpation.   EXTREMITIES: Diminished pedal pulses. No lower extremity edema, calf tenderness, or cyanosis.   ABDOMEN: Soft, nontender. Bowel sounds are present. No hepatosplenomegaly or masses are noted.   RECTAL:  Deferred.  MUSCULOSKELETAL:  Muscle strength- the patient is able to move her upper extremities.  However, now she is in a position where she cannot move her right lower extremity due to lying on that side and left lower extremity due to significant pains in the left hip area.   SKIN:  Skin did not reveal any rashes, lesions, erythema, nodularity, or induration. It was warm and dry to palpation.   LYMPH: No adenopathy in the cervical region.   NEUROLOGICAL: Cranial nerves grossly intact. Sensory grossly intact. No dysarthria or aphasia.   PSYCH: The patient is alert, oriented to person and place, cooperative. Her memory is significantly impaired. She is in significant distress. She is stressed out.   LABORATORY DATA:  Glucose level of 197, BUN and creatinine were 21 and 0.45. Otherwise, basic metabolic panel is unremarkable. Ferritin level is low at 10. White blood cell count is normal at 7.3, hemoglobin 11.4, platelets 231. MCV low at 72. Coagulation panel is unremarkable. EKG showed sinus tachycardia at a rate of 108 beats per minute, normal axis, T depression in the inferior lateral leads. No EKG to compare with.   RADIOLOGIC STUDIES: Left hip complete x-ray 08/25/11: Subcapital femoral neck fracture.  Pelvis AP 08/25/11: Left hip fracture.   ASSESSMENT AND PLAN:  1. Preoperative evaluation for a patient who is about to undergo left hip fracture repair: The patient does  have significant risk factors for coronary artery disease and perioperative cardiovascular events such as abnormal EKG as well as advanced age, history of diabetes mellitus, hypertension, hyperlipidemia,  but no known history of coronary artery disease or chest pain on exertion.  I think the patient is okay. I discussed with her the risks and patient is okay to undergo the procedure. However, she is to continue metoprolol while in the hospital and also nitroglycerin topically, which will be given for her elevated blood pressure. We will follow the patient along. We will follow her cardiac enzymes as well.  2. Malignant  hypertension: As mentioned above, we will resume her outpatient medications plus we will add nitroglycerin topically and we will follow the patient's blood pressure readings.  3. History of diabetes mellitus: We will resume the patient's metformin as well as sliding scale insulin while in the hospital. Get hemoglobin A1c. 4. Hyperlipidemia: We will continue outpatient medication such as simvastatin.  5. History of anxiety and depression: We will continue Zoloft. 6. History of dementia: We will continue donepezil.  7. Anemia: Microcytic anemia with low ferritin level concerning for iron deficiency anemia. We will check the patient's guaiac. We will follow the patient's hemoglobin level after the procedure and will transfuse as necessary.       TIME SPENT: One hour.   ____________________________ Mackenzie Caperima Mackenzie Keleher, MD rv:bjt D:        07/30/2011 12:10:56 ET         T: 07/31/2011 09:22:18 ET          JOB#: [id number]  cc: Mackenzie Caperima Mackenzie Frix, MD, <Dictator> Dr. Ronne Binninghambliss, [facility]  Preston Weill MD ELECTRONICALLY SIGNED 08/13/2011 20:24

## 2014-10-25 NOTE — Op Note (Signed)
PATIENT NAME:  Mackenzie [name], Mackenzie [name] MR#:  [id number] DATE OF BIRTH:  07/18/1927  DATE OF PROCEDURE:  07/30/2011  PREOPERATIVE DIAGNOSIS: Left femoral neck fracture.   POSTOPERATIVE DIAGNOSIS: Left femoral neck fracture.  PROCEDURE: Cemented arthroplasty, left hip.   SURGEON: Winn JockJames C. Elie Gragert, M.D.   ASSISTANT: None.   ANESTHESIA: Spinal.   ESTIMATED BLOOD LOSS: 150 mL.  REPLACEMENT: 1500 mL crystalloid.   DRAINS: None.   COMPLICATIONS: None.   SPECIMEN SENT: Femoral head.  IMPLANTS USED: Stryker #7 ODC femoral component, +10 neck, 45 mm Unitrax head, #4 cement restrictor, antibiotic cement.   COMPLICATIONS: None.  BRIEF CLINICAL NOTE AND PATHOLOGY: The patient fell suffering the above-mentioned fracture. She normally ambulates around her house with a walker. She denied any other injuries. She was brought to the Emergency Room where x-rays revealed a basilar neck fracture which appeared to extend into the femoral head. We elected to proceed with operative intervention. Options, risks, and benefits were discussed. The patient understood and wished to proceed. At time of the procedure, the acetabulum was without damage. The fracture did extend from the calcar area up the neck into the femoral head. The patient's bone was of fair quality.   DESCRIPTION OF PROCEDURE: Adequate spinal anesthesia, right lateral decubitus position, preop antibiotics. Routine prepping and draping. A routine posterior approach was made. Fascia was opened in line with the incision. Charnley retractor was placed after the sciatic nerve was identified and protected.   The piriformis was tagged and reflected. The capsule was opened in a T-shape fashion. The proximal femur was delivered when the hip was dislocated. The femoral neck was removed with the head. The cookie-cutter was used and the lateral canal was opened. Reaming progressed up to size 7. Calcar planing performed.   Femoral head sizing was performed. The canal  was thoroughly irrigated, a cement restrictor was placed. Neo-Synephrine soaked sponge was placed. The canal was dried. Cement was mixed and placed distal to proximal and pressurized. The component was inserted maintaining appropriate anteversion. It was impacted and pressure maintained while excess cement was removed.   We explored for excess cement. The incision again was thoroughly irrigated and the acetabulum was inspected. The head and neck were applied. It was reduced. It was extremely stable. Leg length appeared good. The piriformis was repaired with #5 Ethibond. The fascia was closed with #5 Ethibond. The subcutaneous was closed with zero quill. The skin was closed with staples. Soft sterile dressing was applied. Sponge and needle counts were reported as correct prior to and after wound closure. The patient was awakened and taken to the postanesthesia care unit having tolerated the procedure well.  ____________________________ Winn JockJames C. Gerrit Heckaliff, MD jcc:slb D: 07/30/2011 17:11:52 ET T: 07/31/2011 09:02:35 ET JOB#: [id number]  cc: Winn JockJames C. Gerrit Heckaliff, MD, <Dictator> Winn JockJAMES C Jina Olenick MD ELECTRONICALLY SIGNED 08/01/2011 8:31

## 2014-10-25 NOTE — H&P (Signed)
Subjective/Chief Complaint Left hip pain    History of Present Illness Fell this AM. Not sure how. Denies any injuries except left hip. To ER, seen by medical and cleared.    Past Medical Health Coronary Artery Disease, Hypertension    Primary Physician Out of town   Past Med/Surgical Hx:  Ankle fracture:   Pelvic fracture:   Hyperlipidemia:   DM:   HTN:   Eye surgery: Lens implants bilaterally  Hernia repair:   Aortic Valve Repair?:   ALLERGIES:  No Known Allergies:   HOME MEDICATIONS:  simvastatin 40 mg oral tablet: 1 tab(s) orally once a day (at bedtime), Active  metformin 1000 mg oral tablet: 1 tab(s) orally 2 times a day, Active  donepezil 5 mg oral tablet: 1 tab(s) orally once a day (at bedtime), Active  Zoloft 50 mg oral tablet: 1 tab(s) orally once a day, Active  metoprolol tartrate 50 mg oral tablet: 1 tab(s) orally 2 times a day, Active  hydrochlorothiazide 25 mg oral tablet: 1 tab(s) orally once a day, Active  Family and Social History:   Family History Non-Contributory    Social History negative tobacco, negative ETOH, negative Illicit drugs    Place of Living Home  But looking for facility, had appt tomorrow   Review of Systems:   Fever/Chills No    Cough No    Sputum No    Abdominal Pain No    Diarrhea No    Constipation No    Nausea/Vomiting No    SOB/DOE No    Chest Pain No   Physical Exam:   GEN WD    NECK supple    RESP normal resp effort  clear BS    ABD denies tenderness  soft    LYMPH negative neck    EXTR Pain at left groiun with ROM    SKIN normal to palpation    NEURO motor/sensory function intact    PSYCH A+O to time, place, person   Routine Coag:  27-Jan-13 09:57    Activated PTT (APTT) -  Routine Chem:  27-Jan-13 09:57    Glucose, Serum 197   BUN 21   Creatinine (comp) 0.45   Sodium, Serum 139   Potassium, Serum 4.9   Chloride, Serum 101   CO2, Serum 29   Calcium (Total), Serum 9.4   Anion Gap 9    Osmolality (calc) 286   eGFR (African American) >60   eGFR (Non-African American) >60  Routine Hem:  27-Jan-13 09:57    WBC (CBC) 7.3   RBC (CBC) 4.86   Hemoglobin (CBC) 11.4   Hematocrit (CBC) 35.1   Platelet Count (CBC) 231   MCV 72   MCH 23.5   MCHC 32.5   RDW 19.3  Routine Coag:  27-Jan-13 09:57    Prothrombin -   INR -  Routine Chem:  27-Jan-13 09:57    Hemoglobin A1c ([facility]) 7.2  Cardiac:  27-Jan-13 09:57    CPK-MB, Serum 1.0   Troponin I < 0.02  Routine Coag:  27-Jan-13 11:16    Activated PTT (APTT) 26.4   Prothrombin 12.7   INR 0.9  Routine Chem:  27-Jan-13 11:16    Ferritin [facility]) 10   Radiology Results: XRay:    27-Jan-13 09:23, Hip Left Complete   Hip Left Complete   PRELIMINARY REPORT    The following is a PRELIMINARY Radiology report.  A final report will follow pending radiologist verification.      REASON FOR  EXAM:    pain s/p fall  COMMENTS:       PROCEDURE: DXR - DXR HIP LEFT COMPLETE  - Jul 30 2011  9:23AM     RESULT: A subcapital femoral neck fracture is appreciated with mild   impaction and superior angulation. The bones are osteopenic.    IMPRESSION:  Subcapital femoral neck fracture.    Thank you for the opportunityto contribute to the care of your patient.           Dictated By: Mikki Santee, M.D., MD    27-Jan-13 09:43, Pelvis AP Only   Pelvis AP Only   PRELIMINARY REPORT    The following is a PRELIMINARY Radiology report.  A final report will follow pending radiologist verification.      REASON FOR EXAM:    fall + pain  COMMENTS:       PROCEDURE: DXR - DXR PELVIS APONLY  - Jul 30 2011  9:43AM     RESULT:     The bones are osteopenic. A fracture is appreciated along the base of the   left hip. There also appears be a subcapital component. No further   fracture nor dislocation is appreciated. The bones are osteopenic.   Chronic fracture is identified involving the inferior pubic ramus on the    right.    IMPRESSION:    1. Left hip fracture.    Thank you for the opportunity to contribute to the care of your patient.             Dictated By: Mikki Santee, M.D., MD     Assessment/Admission Diagnosis 1. Left femoral neck fracture 2. Hypertension 3. Anemia 4. Diabetes    Plan 1. Hemiarthroplasty left hip Discussed risks and benefits, possible need for transfusion and associated risks  Will need rehab and probable long term placement. She had appt for tomorrow to look for a facility/arrangement   Electronic Signatures: Lynnda Shields (MD)  (Signed 27-Jan-13 13:09)  Authored: CHIEF COMPLAINT and HISTORY, PAST MEDICAL/SURGIAL HISTORY, ALLERGIES, HOME MEDICATIONS, FAMILY AND SOCIAL HISTORY, REVIEW OF SYSTEMS, PHYSICAL EXAM, LABS, Radiology, ASSESSMENT AND PLAN   Last Updated: 27-Jan-13 13:09 by Lynnda Shields (MD)

## 2014-10-25 NOTE — Discharge Summary (Signed)
PATIENT NAME:  Mackenzie SledgeHILL, Shinita MR#:  [id number] DATE OF BIRTH:  1927/12/17  DATE OF ADMISSION:  07/30/2011 DATE OF DISCHARGE:  08/02/2011  ADMITTING DIAGNOSIS: Left femoral neck fracture.   DISCHARGE DIAGNOSIS: Left femoral neck fracture.  OPERATION: On 07/30/2011, the patient had a left femoral cemented hemiarthroplasty.   SURGEON: Ruthann CancerJames Califf, MD  ASSISTANT: None.   ANESTHESIA: Spinal.   ESTIMATED BLOOD LOSS: 150 mL.  REPLACEMENT: 1500 mL crystalloid.   DRAINS: None.   COMPLICATIONS: None.   SPECIMENS SENT: Femoral head.  IMPLANTS USED: Stryker #7 ODC femoral component, +10 neck, 45 mm Unitrax head,  #4 cement restrictor, antibiotic cement.   COMPLICATIONS: None.   The patient was stabilized, brought to the recovery room, and then brought down to the orthopedic floor.   HISTORY: The patient is an 81 year old female who presented after falling on the morning of admission injuring her left hip. The patient could not ambulate and was brought to the Emergency Room where x-rays revealed a femoral neck fracture. The patient was cleared medically for surgery.   PHYSICAL EXAMINATION: GENERAL: Well-developed, well-nourished female in the bed with no discomfort. LUNGS: Respirations clear. CARDIAC: Normal with no murmur. MUSCULOSKELETAL: In regard to the left lower extremity, the patient has limited range of motion with pain and external rotation. The patient has neurovascular intact.   HOSPITAL COURSE: After initial admission on 07/30/2011, the patient was brought to the orthopedic floor. On postoperative day one, the patient had hemoglobin of 9.3. On postoperative day two, hemoglobin was 8.3, and on postoperative day three, on the day of discharge, it was up to 8.2. The patient was doing well after surgery. The patient ambulated only bed to chair even up to the day of discharge. The patient was ready for discharge to skilled nursing.   DISCHARGE INSTRUCTIONS:  1. The [facility] for staple removal.  2. The patient will have a hemoglobin drawn on 08/03/2011 with the results to Dr. Dan HumphreysWalker.  3. The patient will do occupational therapy doing activities of daily living and get physical therapy doing left weight bear as tolerated.  4. Diet is 1800 calorie diet. 5. TED hose will be used knee high bilaterally. 6. An abduction pillow will be between the legs while in bed and in the chair.  7. Activity is weight bear as tolerated.   DISCHARGE MEDICATIONS:  1. Mylanta DS 30 mL p.o. every six hours p.r.n. for dyspepsia.  2. Dulcolax suppository 10 mg per rectum p.r.n. for constipation.  3. Aricept 5 mg p.o. at bedtime.  4. Lovenox 30 mg subcutaneous twice a day x24 days and discontinue.  5. Hydrochlorothiazide 25 mg p.o. daily.  6. Insulin sliding scale, Glulisine injections. 7. Milk of Magnesia 30 mL p.o. at bedtime p.r.n. for constipation.  8. Glucophage 1000 mg p.o. twice a day. 9. Lopressor 50 mg p.o. every 12 hours. 10. Nitro-Dur patch 0.3 mg topically daily.  11. Oxycodone 5 mg 1 to 2 tablets every 3 to 4 hours p.r.n. for severe pain.     12. Zoloft 50 mg p.o. at bedtime. 13. Zocor 40 mg p.o. at bedtime.  14. Vitamin B complex with iron intrinsic factor 1 capsule p.o. twice a day. ____________________________ J. Dedra Skeensodd Amoria Mclees, [place] jtm:slb D: 08/02/2011 07:44:08 ET T: 08/02/2011 08:09:53 ET JOB#: [id number]  cc: J. Dedra Skeensodd Cheron Coryell, [place], <Dictator> J Teague Goynes [facility] PA ELECTRONICALLY SIGNED 08/07/2011 7:45

## 2014-10-25 NOTE — Consult Note (Signed)
Brief Consult Note: Diagnosis: preopeartive evaluation , subcapirtal L femoral neck fx, malignant HTN, DM, hyperlipidemia, Fe defficiency anemia.   Patient was seen by consultant.   Consult note dictated.   Recommend further assessment or treatment.   Orders entered.   Comments: 1. preoperative evaluation of Pt with L femoral neck Fx, Pt has moderate risk facrtors for perioperative cardiovascular events, abnormal EKG, malignnat HTn, DM, HL, but no known CAD or chest pains on exertion, would continue metoprolol , control BP, discussed extensively with Pt and her caregiver about risks, she wants to procede to OR to fix hip 2. malignant HTn, restart metoprolol, add nitro TOp, if BP is still poorly controled 3. DM, metformin, SSI, get Hgb a1c, follwoing 4. Fe defficiency anemia, guaiac, transfuse as needed 5 HYpeerlipidemia, simvastatin 6. dementia, donepezil qhs 7 anx/depression, zoloft thanks for consult, will follow.  Electronic Signatures: Katharina CaperVaickute, Atzel Mccambridge (MD)  (Signed 27-Jan-13 12:24)  Authored: Brief Consult Note   Last Updated: 27-Jan-13 12:24 by Katharina CaperVaickute, Mariska Daffin (MD)
